# Patient Record
Sex: Female | Born: 1945
Health system: Southern US, Community
[De-identification: ages and names within clinical notes are randomized; demographics above are authoritative.]

## PROBLEM LIST (undated history)

## (undated) DIAGNOSIS — F419 Anxiety disorder, unspecified: Secondary | ICD-10-CM

## (undated) DIAGNOSIS — I10 Essential (primary) hypertension: Secondary | ICD-10-CM

## (undated) DIAGNOSIS — H269 Unspecified cataract: Secondary | ICD-10-CM

## (undated) DIAGNOSIS — C801 Malignant (primary) neoplasm, unspecified: Secondary | ICD-10-CM

## (undated) DIAGNOSIS — R7303 Prediabetes: Secondary | ICD-10-CM

## (undated) DIAGNOSIS — G4730 Sleep apnea, unspecified: Secondary | ICD-10-CM

## (undated) DIAGNOSIS — M1990 Unspecified osteoarthritis, unspecified site: Secondary | ICD-10-CM

## (undated) DIAGNOSIS — R112 Nausea with vomiting, unspecified: Secondary | ICD-10-CM

## (undated) DIAGNOSIS — D649 Anemia, unspecified: Secondary | ICD-10-CM

## (undated) DIAGNOSIS — T7840XA Allergy, unspecified, initial encounter: Secondary | ICD-10-CM

## (undated) DIAGNOSIS — Z9889 Other specified postprocedural states: Secondary | ICD-10-CM

## (undated) HISTORY — DX: Unspecified cataract: H26.9

## (undated) HISTORY — PX: COLON SURGERY: SHX602

## (undated) HISTORY — DX: Allergy, unspecified, initial encounter: T78.40XA

## (undated) HISTORY — PX: CATARACT EXTRACTION: SUR2

## (undated) HISTORY — PX: ABDOMINAL HYSTERECTOMY: SHX81

---

## 1974-07-25 HISTORY — PX: BREAST CYST EXCISION: SHX579

## 1994-07-25 HISTORY — PX: BREAST CYST EXCISION: SHX579

## 1994-07-25 HISTORY — PX: ABDOMINAL HYSTERECTOMY: SHX81

## 2004-06-15 DIAGNOSIS — J301 Allergic rhinitis due to pollen: Secondary | ICD-10-CM

## 2006-12-13 ENCOUNTER — Ambulatory Visit: Payer: Self-pay | Admitting: Gastroenterology

## 2007-11-22 DIAGNOSIS — H409 Unspecified glaucoma: Secondary | ICD-10-CM | POA: Insufficient documentation

## 2012-01-05 DIAGNOSIS — Z90710 Acquired absence of both cervix and uterus: Secondary | ICD-10-CM | POA: Insufficient documentation

## 2012-01-05 DIAGNOSIS — Z9889 Other specified postprocedural states: Secondary | ICD-10-CM | POA: Insufficient documentation

## 2012-01-05 DIAGNOSIS — Z803 Family history of malignant neoplasm of breast: Secondary | ICD-10-CM | POA: Insufficient documentation

## 2012-01-05 DIAGNOSIS — N6019 Diffuse cystic mastopathy of unspecified breast: Secondary | ICD-10-CM | POA: Insufficient documentation

## 2012-08-07 ENCOUNTER — Other Ambulatory Visit: Payer: Self-pay | Admitting: Family Medicine

## 2012-08-07 LAB — COMPREHENSIVE METABOLIC PANEL
Alkaline Phosphatase: 63 U/L (ref 50–136)
Anion Gap: 6 — ABNORMAL LOW (ref 7–16)
Chloride: 106 mmol/L (ref 98–107)
EGFR (Non-African Amer.): >60
Glucose: 99 mg/dL (ref 65–99)
Potassium: 4 mmol/L (ref 3.5–5.1)
Total Protein: 7.7 g/dL (ref 6.4–8.2)

## 2012-08-07 LAB — CK: CK, Total: 150 U/L (ref 21–215)

## 2012-08-07 LAB — LIPID PANEL
Cholesterol: 223 mg/dL — ABNORMAL HIGH (ref 0–200)
Triglycerides: 147 mg/dL (ref 0–200)
VLDL Cholesterol, Calc: 29 mg/dL (ref 5–40)

## 2013-11-14 LAB — LIPID PANEL
Cholesterol: 200 mg/dL (ref 0–200)
HDL: 74 mg/dL — AB (ref 35–70)
LDL CALC: 102 mg/dL
LDL/HDL RATIO: 1.4
TRIGLYCERIDES: 120 mg/dL (ref 40–160)

## 2013-11-14 LAB — CBC AND DIFFERENTIAL
HCT: 42 % (ref 36–46)
Hemoglobin: 13.9 g/dL (ref 12.0–16.0)
Neutrophils Absolute: 2 /uL
Platelets: 271 10*3/uL (ref 150–399)
WBC: 4.7 10*3/mL

## 2013-11-14 LAB — TSH: TSH: 3.27 u[IU]/mL (ref 0.41–5.90)

## 2014-01-22 LAB — HM MAMMOGRAPHY

## 2014-09-23 DIAGNOSIS — Z23 Encounter for immunization: Secondary | ICD-10-CM | POA: Diagnosis not present

## 2014-09-23 DIAGNOSIS — G4700 Insomnia, unspecified: Secondary | ICD-10-CM | POA: Diagnosis not present

## 2014-09-23 DIAGNOSIS — J069 Acute upper respiratory infection, unspecified: Secondary | ICD-10-CM | POA: Diagnosis not present

## 2014-09-23 DIAGNOSIS — I10 Essential (primary) hypertension: Secondary | ICD-10-CM | POA: Diagnosis not present

## 2014-09-23 DIAGNOSIS — E780 Pure hypercholesterolemia: Secondary | ICD-10-CM | POA: Diagnosis not present

## 2014-10-15 DIAGNOSIS — H409 Unspecified glaucoma: Secondary | ICD-10-CM | POA: Diagnosis not present

## 2014-10-21 DIAGNOSIS — H409 Unspecified glaucoma: Secondary | ICD-10-CM | POA: Diagnosis not present

## 2014-11-13 DIAGNOSIS — Z0000 Encounter for general adult medical examination without abnormal findings: Secondary | ICD-10-CM | POA: Diagnosis not present

## 2014-11-13 DIAGNOSIS — G4700 Insomnia, unspecified: Secondary | ICD-10-CM | POA: Diagnosis not present

## 2014-11-13 DIAGNOSIS — R748 Abnormal levels of other serum enzymes: Secondary | ICD-10-CM | POA: Diagnosis not present

## 2014-11-13 DIAGNOSIS — I10 Essential (primary) hypertension: Secondary | ICD-10-CM | POA: Diagnosis not present

## 2014-11-13 DIAGNOSIS — E780 Pure hypercholesterolemia: Secondary | ICD-10-CM | POA: Diagnosis not present

## 2014-11-13 DIAGNOSIS — R739 Hyperglycemia, unspecified: Secondary | ICD-10-CM | POA: Diagnosis not present

## 2014-11-13 DIAGNOSIS — Z23 Encounter for immunization: Secondary | ICD-10-CM | POA: Diagnosis not present

## 2014-11-13 LAB — BASIC METABOLIC PANEL
BUN: 18 mg/dL (ref 4–21)
Creatinine: 1 mg/dL (ref 0.5–1.1)
Glucose: 91 mg/dL
Potassium: 4.1 mmol/L (ref 3.4–5.3)
Sodium: 137 mmol/L (ref 137–147)

## 2014-11-13 LAB — HEPATIC FUNCTION PANEL
ALK PHOS: 50 U/L (ref 25–125)
ALT: 24 U/L (ref 7–35)
AST: 19 U/L (ref 13–35)
BILIRUBIN, TOTAL: 0.2 mg/dL

## 2014-11-13 LAB — HEMOGLOBIN A1C: Hemoglobin A1C: 5.9

## 2014-12-16 ENCOUNTER — Other Ambulatory Visit: Payer: Self-pay | Admitting: Family Medicine

## 2014-12-16 DIAGNOSIS — Z1231 Encounter for screening mammogram for malignant neoplasm of breast: Secondary | ICD-10-CM

## 2015-01-13 ENCOUNTER — Encounter: Payer: Self-pay | Admitting: *Deleted

## 2015-02-24 ENCOUNTER — Ambulatory Visit
Admission: RE | Admit: 2015-02-24 | Discharge: 2015-02-24 | Disposition: A | Discharge status: Home / Self Care | Payer: Commercial Managed Care - HMO | Source: Ambulatory Visit | Attending: Family Medicine | Admitting: Family Medicine

## 2015-02-24 DIAGNOSIS — Z1231 Encounter for screening mammogram for malignant neoplasm of breast: Secondary | ICD-10-CM | POA: Diagnosis not present

## 2015-04-16 DIAGNOSIS — Z85828 Personal history of other malignant neoplasm of skin: Secondary | ICD-10-CM | POA: Diagnosis not present

## 2015-04-16 DIAGNOSIS — D2261 Melanocytic nevi of right upper limb, including shoulder: Secondary | ICD-10-CM | POA: Diagnosis not present

## 2015-04-16 DIAGNOSIS — D1723 Benign lipomatous neoplasm of skin and subcutaneous tissue of right leg: Secondary | ICD-10-CM | POA: Diagnosis not present

## 2015-04-16 DIAGNOSIS — D225 Melanocytic nevi of trunk: Secondary | ICD-10-CM | POA: Diagnosis not present

## 2015-05-05 DIAGNOSIS — H40053 Ocular hypertension, bilateral: Secondary | ICD-10-CM | POA: Diagnosis not present

## 2015-09-02 ENCOUNTER — Other Ambulatory Visit: Payer: Self-pay

## 2015-09-02 DIAGNOSIS — G4700 Insomnia, unspecified: Secondary | ICD-10-CM

## 2015-09-02 MED ORDER — CYCLOBENZAPRINE HCL 5 MG PO TABS: 5.0000 mg | ORAL_TABLET | Freq: Every day | ORAL | Status: DC

## 2015-09-02 NOTE — Telephone Encounter (Signed)
Printed, please fax or call in to pharmacy. Thank you.   

## 2015-10-02 ENCOUNTER — Other Ambulatory Visit: Payer: Self-pay

## 2015-10-02 ENCOUNTER — Ambulatory Visit (INDEPENDENT_AMBULATORY_CARE_PROVIDER_SITE_OTHER): Payer: Commercial Managed Care - HMO | Admitting: Family Medicine

## 2015-10-02 ENCOUNTER — Encounter: Payer: Self-pay | Admitting: Family Medicine

## 2015-10-02 VITALS — BP 154/78 | HR 96 | Temp 97.7°F | Resp 16 | Wt 167.0 lb

## 2015-10-02 DIAGNOSIS — F419 Anxiety disorder, unspecified: Secondary | ICD-10-CM | POA: Insufficient documentation

## 2015-10-02 DIAGNOSIS — R739 Hyperglycemia, unspecified: Secondary | ICD-10-CM | POA: Insufficient documentation

## 2015-10-02 DIAGNOSIS — I10 Essential (primary) hypertension: Secondary | ICD-10-CM | POA: Insufficient documentation

## 2015-10-02 DIAGNOSIS — J069 Acute upper respiratory infection, unspecified: Secondary | ICD-10-CM | POA: Insufficient documentation

## 2015-10-02 DIAGNOSIS — G4730 Sleep apnea, unspecified: Secondary | ICD-10-CM | POA: Insufficient documentation

## 2015-10-02 DIAGNOSIS — H109 Unspecified conjunctivitis: Secondary | ICD-10-CM

## 2015-10-02 DIAGNOSIS — R748 Abnormal levels of other serum enzymes: Secondary | ICD-10-CM | POA: Insufficient documentation

## 2015-10-02 DIAGNOSIS — M1990 Unspecified osteoarthritis, unspecified site: Secondary | ICD-10-CM | POA: Insufficient documentation

## 2015-10-02 DIAGNOSIS — E7800 Pure hypercholesterolemia, unspecified: Secondary | ICD-10-CM | POA: Insufficient documentation

## 2015-10-02 MED ORDER — SULFACETAMIDE SODIUM 10 % OP SOLN: 2.0000 [drp] | OPHTHALMIC | Status: DC

## 2015-10-02 NOTE — Progress Notes (Signed)
Subjective:    Patient ID: OASIS FREDRICH, female    DOB: 1946-07-14, 70 y.o.   MRN: [id number]  Conjunctivitis  The current episode started 2 days ago. The onset was gradual. The problem occurs continuously. The problem has been gradually worsening. The problem is moderate. Relieved by: cold compresses. Did help clean gunk out of eye this am.  Associated symptoms include congestion, headaches (pressure. Mild. ), rhinorrhea, eye discharge and eye redness. Pertinent negatives include no fever, no decreased vision, no double vision, no eye itching, no photophobia, no abdominal pain, no ear discharge, no ear pain, no hearing loss, no mouth sores, no sore throat, no swollen glands, no muscle aches, no cough, no wheezing and no eye pain. The right eye is affected. The eyelid exhibits redness. She has been behaving normally.  Pt also has noticed URI sx including congestion x 2 days. No sick exposures. Did take alka seltzer cold.  Eye was more watery, now more dry.  No exposure to pink eye.    Does take eye drops for ocular hypertension.    Review of Systems  Constitutional: Negative for fever.  HENT: Positive for congestion and rhinorrhea. Negative for ear discharge, ear pain, hearing loss, mouth sores and sore throat.   Eyes: Positive for discharge and redness. Negative for double vision, photophobia, pain and itching.  Respiratory: Negative for cough and wheezing.   Gastrointestinal: Negative for abdominal pain.  Neurological: Positive for headaches (pressure. Mild. ).   BP 154/78 mmHg  Pulse 96  Temp(Src) 97.7 F (36.5 C) (Oral)  Resp 16  Wt 167 lb (75.751 kg)   Patient Active Problem List   Diagnosis Date Noted  . Anxiety 10/02/2015  . Abnormal liver enzymes 10/02/2015  . Hypercholesteremia 10/02/2015  . Blood glucose elevated 10/02/2015  . BP (high blood pressure) 10/02/2015  . Arthritis, degenerative 10/02/2015  . Apnea, sleep 10/02/2015  . Insomnia 09/02/2015  . Family  history of breast cancer in first degree relative 01/05/2012  . Bloodgood disease 01/05/2012  . Personal history of surgery to heart and great vessels, presenting hazards to health 01/05/2012  . H/O total hysterectomy 01/05/2012  . Glaucoma 11/22/2007  . Hay fever 06/15/2004   No past medical history on file. Current Outpatient Prescriptions on File Prior to Visit  Medication Sig  . cyclobenzaprine (FLEXERIL) 5 MG tablet Take 1 tablet (5 mg total) by mouth at bedtime.   No current facility-administered medications on file prior to visit.   Allergies  Allergen Reactions  . Silver Nitrate     LIQUID NITRATE used to remove warts   Past Surgical History  Procedure Laterality Date  . Breast cyst excision Right 1996    cyst removed-neg   Social History   Social History  . Marital Status: Married    Spouse Name: N/A  . Number of Children: N/A  . Years of Education: N/A   Occupational History  . Not on file.   Social History Main Topics  . Smoking status: Never Smoker   . Smokeless tobacco: Never Used  . Alcohol Use: Yes     Comment: occasional wine  . Drug Use: No  . Sexual Activity: Not on file   Other Topics Concern  . Not on file   Social History Narrative   Family History  Problem Relation Age of Onset  . Breast cancer Sister 51      Objective:   Physical Exam  Constitutional: She is oriented to person, place, and time.  She appears well-developed and well-nourished.  HENT:  Head: Normocephalic and atraumatic.  Right Ear: External ear normal.  Left Ear: External ear normal.  Mouth/Throat: Oropharynx is clear and moist.  Eyes: Pupils are equal, round, and reactive to light.  Right eye with inflamed conjunctiva.  FROM.  Sclera, pupil and iris normal.   Neck: Normal range of motion. Neck supple.  Cardiovascular: Normal rate and regular rhythm.   Pulmonary/Chest: Effort normal and breath sounds normal.  Neurological: She is alert and oriented to person,  place, and time.   BP 154/78 mmHg  Pulse 96  Temp(Src) 97.7 F (36.5 C) (Oral)  Resp 16  Wt 167 lb (75.751 kg)      Assessment & Plan:   1. Conjunctivitis of right eye New problem. Worsening. Treat with eye drops. Ok to use other eye drops. Referral to [facility] if any pain, worsening or vision changes.   - sulfacetamide (BLEPH-10) 10 % ophthalmic solution; Place 2 drops into the right eye every 3 (three) hours while awake.  Dispense: 15 mL; Refill: 0  2. Upper respiratory infection New problem. OTC treatment. Patient instructed to call back if condition worsens or does not improve, especially fever.   Margarita Rana, MD

## 2015-10-13 ENCOUNTER — Telehealth: Payer: Self-pay

## 2015-10-13 NOTE — Telephone Encounter (Signed)
Needs sooner ov if thinks she has sleep apnea.  Can take months for work up. Thanks.

## 2015-10-13 NOTE — Telephone Encounter (Signed)
FYI... Pt says she would like to discuss sleep apnea at her next office visit in May.   Thanks,   -Mickel Baas

## 2015-10-15 NOTE — Telephone Encounter (Signed)
LMTCB 10/15/2015  Thanks,   -Mickel Baas

## 2015-10-27 ENCOUNTER — Telehealth: Payer: Self-pay | Admitting: Family Medicine

## 2015-10-27 DIAGNOSIS — H409 Unspecified glaucoma: Secondary | ICD-10-CM

## 2015-10-27 NOTE — Telephone Encounter (Signed)
Pt called needing referrral to [facility] for a visit she has scheduled April 11th.  She has [employer]   Her call back is   [phone]  [employer]

## 2015-10-27 NOTE — Telephone Encounter (Signed)
OK to refer. Thanks.

## 2015-10-27 NOTE — Telephone Encounter (Signed)
Order has been placed to see opthalmology.

## 2015-10-27 NOTE — Telephone Encounter (Signed)
Please review. Thanks!  

## 2015-11-03 DIAGNOSIS — H40053 Ocular hypertension, bilateral: Secondary | ICD-10-CM | POA: Diagnosis not present

## 2015-11-05 DIAGNOSIS — C44529 Squamous cell carcinoma of skin of other part of trunk: Secondary | ICD-10-CM | POA: Diagnosis not present

## 2015-11-05 DIAGNOSIS — D485 Neoplasm of uncertain behavior of skin: Secondary | ICD-10-CM | POA: Diagnosis not present

## 2015-11-20 ENCOUNTER — Other Ambulatory Visit: Payer: Self-pay | Admitting: Family Medicine

## 2015-11-20 DIAGNOSIS — F419 Anxiety disorder, unspecified: Secondary | ICD-10-CM

## 2015-11-20 DIAGNOSIS — I10 Essential (primary) hypertension: Secondary | ICD-10-CM

## 2015-12-03 ENCOUNTER — Ambulatory Visit (INDEPENDENT_AMBULATORY_CARE_PROVIDER_SITE_OTHER): Payer: Commercial Managed Care - HMO | Admitting: Family Medicine

## 2015-12-03 ENCOUNTER — Encounter: Payer: Self-pay | Admitting: Family Medicine

## 2015-12-03 VITALS — BP 140/82 | HR 96 | Temp 97.9°F | Resp 16 | Ht 65.25 in | Wt 168.0 lb

## 2015-12-03 DIAGNOSIS — Z1239 Encounter for other screening for malignant neoplasm of breast: Secondary | ICD-10-CM

## 2015-12-03 DIAGNOSIS — I10 Essential (primary) hypertension: Secondary | ICD-10-CM | POA: Diagnosis not present

## 2015-12-03 DIAGNOSIS — R739 Hyperglycemia, unspecified: Secondary | ICD-10-CM

## 2015-12-03 DIAGNOSIS — Z1211 Encounter for screening for malignant neoplasm of colon: Secondary | ICD-10-CM

## 2015-12-03 DIAGNOSIS — F419 Anxiety disorder, unspecified: Secondary | ICD-10-CM

## 2015-12-03 DIAGNOSIS — Z0000 Encounter for general adult medical examination without abnormal findings: Secondary | ICD-10-CM

## 2015-12-03 DIAGNOSIS — E7800 Pure hypercholesterolemia, unspecified: Secondary | ICD-10-CM

## 2015-12-03 DIAGNOSIS — Z780 Asymptomatic menopausal state: Secondary | ICD-10-CM

## 2015-12-03 DIAGNOSIS — M1990 Unspecified osteoarthritis, unspecified site: Secondary | ICD-10-CM

## 2015-12-03 LAB — IFOBT (OCCULT BLOOD): IFOBT: NEGATIVE

## 2015-12-03 NOTE — Progress Notes (Signed)
Patient ID: Cindy Vega, female   DOB: 01/01/1946, 70 y.o.   MRN: [id number]       Patient: Cindy Vega, Female    DOB: [birth year], 70 y.o.   MRN: [id number] Visit Date: 12/03/2015  Today's Provider: Margarita Rana, MD   Chief Complaint  Patient presents with  . Medicare Wellness   Subjective:    Annual wellness visit Cindy Vega is a 70 y.o. female. She feels well. She reports exercising 2 times a week. She reports she is sleeping well.  11/13/14 AWE 03/02/15 Mammogram-BI-RADS 1 12/13/06 Colonoscopy-normal; Dr. Allen Norris -----------------------------------------------------------  Joint pain/swelling: Patient c/o joint pain and swelling in both hands times 2 years. Patient reports symptoms are worsening. Patient reports taking Aleve as needed and reports moderate relief. Patient wants to know if she needs to see rheumatologist.   Review of Systems  Constitutional: Negative.   HENT: Negative.   Eyes: Negative.   Respiratory: Negative.   Cardiovascular: Negative.   Gastrointestinal: Negative.   Endocrine: Negative.   Genitourinary: Negative.   Musculoskeletal: Positive for joint swelling.  Skin: Negative.   Allergic/Immunologic: Negative.   Neurological: Negative.   Hematological: Negative.   Psychiatric/Behavioral: Negative.     Social History   Social History  . Marital Status: Married    Spouse Name: N/A  . Number of Children: N/A  . Years of Education: N/A   Occupational History  . Not on file.   Social History Main Topics  . Smoking status: Never Smoker   . Smokeless tobacco: Never Used  . Alcohol Use: 1.2 oz/week    2 Glasses of wine per week     Comment: occasional wine  . Drug Use: No  . Sexual Activity: Not on file   Other Topics Concern  . Not on file   Social History Narrative    History reviewed. No pertinent past medical history.   Patient Active Problem List   Diagnosis Date Noted  . Anxiety 10/02/2015  . Abnormal  liver enzymes 10/02/2015  . Hypercholesteremia 10/02/2015  . Blood glucose elevated 10/02/2015  . BP (high blood pressure) 10/02/2015  . Arthritis, degenerative 10/02/2015  . Apnea, sleep 10/02/2015  . Upper respiratory infection 10/02/2015  . Insomnia 09/02/2015  . Family history of breast cancer in first degree relative 01/05/2012  . Bloodgood disease 01/05/2012  . Personal history of surgery to heart and great vessels, presenting hazards to health 01/05/2012  . H/O total hysterectomy 01/05/2012  . Glaucoma 11/22/2007  . Hay fever 06/15/2004    Past Surgical History  Procedure Laterality Date  . Breast cyst excision Right 1996    cyst removed-neg  . Cataract extraction    . Abdominal hysterectomy      Her family history includes Breast cancer (age of onset: 78) in her sister; Diabetes in her father and mother; Berenice Primas' disease in her sister; Heart disease in her mother; Hypertension in her mother; Prostate cancer in her father.    Previous Medications   ASCORBIC ACID (VITAMIN C) 500 MG TABLET    Take by mouth.   ASPIRIN 81 MG TABLET    Take by mouth every other day.    BIOTIN 5 MG CAPS       CHOLECALCIFEROL (VITAMIN D) 1000 UNITS TABLET    Take by mouth.   CYCLOBENZAPRINE (FLEXERIL) 5 MG TABLET    Take 1 tablet (5 mg total) by mouth at bedtime.   FLUOXETINE (PROZAC) 10 MG TABLET    TAKE 1 TABLET  EVERY DAY   LATANOPROST (XALATAN) 0.005 % OPHTHALMIC SOLUTION    1 drop at bedtime.   MULTIPLE VITAMIN PO    Take by mouth.   OMEGA-3 FATTY ACIDS (FISH OIL) 1000 MG CAPS       TRIAMTERENE-HYDROCHLOROTHIAZIDE (MAXZIDE-25) 37.5-25 MG TABLET    TAKE 1 TABLET EVERY DAY    Patient Care Team: Margarita Rana, MD as PCP - General (Family Medicine)     Objective:   Vitals: BP 140/82 mmHg  Pulse 96  Temp(Src) 97.9 F (36.6 C) (Oral)  Resp 16  Ht 5' 5.25" (1.657 m)  Wt 168 lb (76.204 kg)  BMI 27.75 kg/m2  Physical Exam  Constitutional: She is oriented to person, place, and time.  She appears well-developed and well-nourished.  HENT:  Head: Normocephalic and atraumatic.  Right Ear: Tympanic membrane, external ear and ear canal normal.  Left Ear: Tympanic membrane, external ear and ear canal normal.  Nose: Nose normal.  Mouth/Throat: Uvula is midline, oropharynx is clear and moist and mucous membranes are normal.  Eyes: Conjunctivae, EOM and lids are normal. Pupils are equal, round, and reactive to light.  Neck: Trachea normal and normal range of motion. Neck supple. Carotid bruit is not present. No thyroid mass and no thyromegaly present.  Cardiovascular: Normal rate, regular rhythm and normal heart sounds.   Pulmonary/Chest: Effort normal and breath sounds normal.  Abdominal: Soft. Normal appearance and bowel sounds are normal. There is no hepatosplenomegaly. There is no tenderness.  Genitourinary: Rectum normal. No breast swelling, tenderness or discharge.  Musculoskeletal: Normal range of motion.  Lymphadenopathy:    She has no cervical adenopathy.    She has no axillary adenopathy.  Neurological: She is alert and oriented to person, place, and time. She has normal strength. No cranial nerve deficit.  Skin: Skin is warm, dry and intact.  Psychiatric: She has a normal mood and affect. Her speech is normal and behavior is normal. Judgment and thought content normal. Cognition and memory are normal.    Activities of Daily Living In your present state of health, do you have any difficulty performing the following activities: 12/03/2015  Hearing? N  Vision? N  Difficulty concentrating or making decisions? N  Walking or climbing stairs? N  Dressing or bathing? N  Doing errands, shopping? N    Fall Risk Assessment Fall Risk  12/03/2015  Falls in the past year? No     Depression Screen PHQ 2/9 Scores 12/03/2015  PHQ - 2 Score 0    Cognitive Testing - 6-CIT  Correct? Score   What year is it? yes 0 0 or 4  What month is it? yes 0 0 or 3  Memorize:    Pia Mau,  [birth year],  [place],      What time is it? (within 1 hour) yes 0 0 or 3  Count backwards from 20 yes 0 0, 2, or 4  Name the months of the year yes 0 0, 2, or 4  Repeat name & address above yes 0 0, 2, 4, 6, 8, or 10       TOTAL SCORE  0/28   Interpretation:  Normal  Normal (0-7) Abnormal (8-28)    Assessment & Plan:     Annual Wellness Visit  Reviewed patient's Family Medical History Reviewed and updated list of patient's medical providers Assessment of cognitive impairment was done Assessed patient's functional ability Established a written schedule for health screening services Health Risk Assessment  Completed and Reviewed  Exercise Activities and Dietary recommendations Goals    None      Immunization History  Administered Date(s) Administered  . Influenza-Unspecified 04/24/2014, 05/26/2015  . Pneumococcal Conjugate-13 05/13/2014  . Pneumococcal Polysaccharide-23 05/08/2013  . Td 04/25/2003  . Tdap 08/18/2010  . Zoster 06/02/2008     1. Medicare annual wellness visit, subsequent As above. Patient advised to continue working on diet and exercise.  2. Arthritis F/U pending lab report. - Rheumatoid factor - Sedimentation rate - CYCLIC CITRUL PEPTIDE ANTIBODY, IGG/IGA  3. Colon cancer screening Negative - IFOBT POC (occult bld, rslt in office)  4. Breast cancer screening - MM DIGITAL SCREENING BILATERAL; Future  5. Essential hypertension - CBC with Differential/Platelet - Comprehensive metabolic panel  6. Anxiety - TSH  7. Postmenopausal - DG Bone Density; Future  8. Hypercholesteremia - Lipid Panel With LDL/HDL Ratio  9. Blood glucose elevated - Hemoglobin A1c     Patient seen and examined by Dr. Jerrell Belfast, and note scribed by Philbert Riser. Dimas, CMA.  I have reviewed the document for accuracy and completeness and I agree with above. Jerrell Belfast, MD   Margarita Rana, MD    ------------------------------------------------------------------------------------------------------------

## 2015-12-04 DIAGNOSIS — I10 Essential (primary) hypertension: Secondary | ICD-10-CM | POA: Diagnosis not present

## 2015-12-04 DIAGNOSIS — R739 Hyperglycemia, unspecified: Secondary | ICD-10-CM | POA: Diagnosis not present

## 2015-12-04 DIAGNOSIS — M1990 Unspecified osteoarthritis, unspecified site: Secondary | ICD-10-CM | POA: Diagnosis not present

## 2015-12-04 DIAGNOSIS — E7800 Pure hypercholesterolemia, unspecified: Secondary | ICD-10-CM | POA: Diagnosis not present

## 2015-12-04 DIAGNOSIS — F419 Anxiety disorder, unspecified: Secondary | ICD-10-CM | POA: Diagnosis not present

## 2015-12-06 LAB — HEMOGLOBIN A1C
Est. average glucose Bld gHb Est-mCnc: 126 mg/dL
HEMOGLOBIN A1C: 6 % — AB (ref 4.8–5.6)

## 2015-12-06 LAB — COMPREHENSIVE METABOLIC PANEL
A/G RATIO: 2 (ref 1.2–2.2)
ALK PHOS: 53 IU/L (ref 39–117)
ALT: 27 IU/L (ref 0–32)
AST: 20 IU/L (ref 0–40)
Albumin: 4.6 g/dL (ref 3.6–4.8)
BILIRUBIN TOTAL: 0.4 mg/dL (ref 0.0–1.2)
BUN / CREAT RATIO: 18 (ref 12–28)
BUN: 19 mg/dL (ref 8–27)
CHLORIDE: 98 mmol/L (ref 96–106)
CO2: 26 mmol/L (ref 18–29)
Calcium: 9.8 mg/dL (ref 8.7–10.3)
Creatinine, Ser: 1.06 mg/dL — ABNORMAL HIGH (ref 0.57–1.00)
GFR calc Af Amer: 62 mL/min/{1.73_m2} (ref >59)
GFR calc non Af Amer: 54 mL/min/{1.73_m2} — ABNORMAL LOW (ref >59)
GLUCOSE: 103 mg/dL — AB (ref 65–99)
Globulin, Total: 2.3 g/dL (ref 1.5–4.5)
POTASSIUM: 4.5 mmol/L (ref 3.5–5.2)
Sodium: 139 mmol/L (ref 134–144)
Total Protein: 6.9 g/dL (ref 6.0–8.5)

## 2015-12-06 LAB — CBC WITH DIFFERENTIAL/PLATELET
BASOS: 1 %
Basophils Absolute: 0 10*3/uL (ref 0.0–0.2)
EOS (ABSOLUTE): 0.2 10*3/uL (ref 0.0–0.4)
EOS: 4 %
HEMATOCRIT: 40.2 % (ref 34.0–46.6)
HEMOGLOBIN: 13.4 g/dL (ref 11.1–15.9)
IMMATURE GRANULOCYTES: 0 %
Immature Grans (Abs): 0 10*3/uL (ref 0.0–0.1)
Lymphocytes Absolute: 2.1 10*3/uL (ref 0.7–3.1)
Lymphs: 41 %
MCH: 31.2 pg (ref 26.6–33.0)
MCHC: 33.3 g/dL (ref 31.5–35.7)
MCV: 94 fL (ref 79–97)
MONOCYTES: 9 %
Monocytes Absolute: 0.5 10*3/uL (ref 0.1–0.9)
NEUTROS PCT: 45 %
Neutrophils Absolute: 2.3 10*3/uL (ref 1.4–7.0)
Platelets: 310 10*3/uL (ref 150–379)
RBC: 4.29 x10E6/uL (ref 3.77–5.28)
RDW: 14 % (ref 12.3–15.4)
WBC: 5.1 10*3/uL (ref 3.4–10.8)

## 2015-12-06 LAB — LIPID PANEL WITH LDL/HDL RATIO
CHOLESTEROL TOTAL: 260 mg/dL — AB (ref 100–199)
HDL: 86 mg/dL (ref >39)
LDL Calculated: 142 mg/dL — ABNORMAL HIGH (ref 0–99)
LDL/HDL RATIO: 1.7 ratio (ref 0.0–3.2)
TRIGLYCERIDES: 159 mg/dL — AB (ref 0–149)
VLDL Cholesterol Cal: 32 mg/dL (ref 5–40)

## 2015-12-06 LAB — RHEUMATOID FACTOR

## 2015-12-06 LAB — SEDIMENTATION RATE: SED RATE: 2 mm/h (ref 0–40)

## 2015-12-06 LAB — CYCLIC CITRUL PEPTIDE ANTIBODY, IGG/IGA: CYCLIC CITRULLIN PEPTIDE AB: 6 U (ref 0–19)

## 2015-12-06 LAB — TSH: TSH: 2.48 u[IU]/mL (ref 0.450–4.500)

## 2015-12-07 ENCOUNTER — Telehealth: Payer: Self-pay

## 2015-12-07 NOTE — Telephone Encounter (Signed)
-----   Message from Margarita Rana, MD sent at 12/06/2015 12:05 PM EDT ----- Labs stable. Cholesterol is elevated at 260.  10 year risk of heart disease is 11 percent.  Medication is recommended. Please see if patient would like to consider starting a medication.  Blood sugar is slightly elevated.  Continue to eat healthy and exercise. Recheck in 6 months. Thanks.

## 2015-12-07 NOTE — Telephone Encounter (Signed)
Left message to call back  

## 2015-12-07 NOTE — Telephone Encounter (Signed)
Advised patient of results. Patient reports that she will work on diet habits first. Will recheck labs in 6 months.

## 2015-12-17 DIAGNOSIS — C44529 Squamous cell carcinoma of skin of other part of trunk: Secondary | ICD-10-CM | POA: Diagnosis not present

## 2016-02-25 ENCOUNTER — Other Ambulatory Visit: Payer: Self-pay | Admitting: Family Medicine

## 2016-02-25 ENCOUNTER — Ambulatory Visit
Admission: RE | Admit: 2016-02-25 | Discharge: 2016-02-25 | Disposition: A | Discharge status: Home / Self Care | Payer: Commercial Managed Care - HMO | Source: Ambulatory Visit | Attending: Family Medicine | Admitting: Family Medicine

## 2016-02-25 DIAGNOSIS — Z1239 Encounter for other screening for malignant neoplasm of breast: Secondary | ICD-10-CM | POA: Insufficient documentation

## 2016-02-25 DIAGNOSIS — Z780 Asymptomatic menopausal state: Secondary | ICD-10-CM | POA: Diagnosis not present

## 2016-02-25 DIAGNOSIS — M8588 Other specified disorders of bone density and structure, other site: Secondary | ICD-10-CM | POA: Diagnosis not present

## 2016-02-25 DIAGNOSIS — M85852 Other specified disorders of bone density and structure, left thigh: Secondary | ICD-10-CM | POA: Insufficient documentation

## 2016-02-25 DIAGNOSIS — Z1231 Encounter for screening mammogram for malignant neoplasm of breast: Secondary | ICD-10-CM | POA: Insufficient documentation

## 2016-02-25 HISTORY — DX: Malignant (primary) neoplasm, unspecified: C80.1

## 2016-04-01 ENCOUNTER — Ambulatory Visit (INDEPENDENT_AMBULATORY_CARE_PROVIDER_SITE_OTHER): Payer: Commercial Managed Care - HMO | Admitting: Family Medicine

## 2016-04-01 DIAGNOSIS — Z23 Encounter for immunization: Secondary | ICD-10-CM | POA: Diagnosis not present

## 2016-04-01 NOTE — Progress Notes (Signed)
Vaccine only, no MD visit.  

## 2016-04-14 DIAGNOSIS — D1801 Hemangioma of skin and subcutaneous tissue: Secondary | ICD-10-CM | POA: Diagnosis not present

## 2016-04-14 DIAGNOSIS — Z85828 Personal history of other malignant neoplasm of skin: Secondary | ICD-10-CM | POA: Diagnosis not present

## 2016-04-14 DIAGNOSIS — L448 Other specified papulosquamous disorders: Secondary | ICD-10-CM | POA: Diagnosis not present

## 2016-04-14 DIAGNOSIS — L821 Other seborrheic keratosis: Secondary | ICD-10-CM | POA: Diagnosis not present

## 2016-05-03 DIAGNOSIS — H40053 Ocular hypertension, bilateral: Secondary | ICD-10-CM | POA: Diagnosis not present

## 2016-11-08 DIAGNOSIS — H40053 Ocular hypertension, bilateral: Secondary | ICD-10-CM | POA: Diagnosis not present

## 2016-11-29 ENCOUNTER — Telehealth: Payer: Self-pay | Admitting: Physician Assistant

## 2016-11-29 DIAGNOSIS — I10 Essential (primary) hypertension: Secondary | ICD-10-CM

## 2016-11-29 DIAGNOSIS — F339 Major depressive disorder, recurrent, unspecified: Secondary | ICD-10-CM

## 2016-11-29 MED ORDER — TRIAMTERENE-HCTZ 37.5-25 MG PO TABS: 1.0000 | ORAL_TABLET | Freq: Every day | ORAL | 3 refills | Status: DC

## 2016-11-29 MED ORDER — FLUOXETINE HCL 10 MG PO TABS: 10.0000 mg | ORAL_TABLET | Freq: Every day | ORAL | 3 refills | Status: DC

## 2016-11-29 NOTE — Telephone Encounter (Addendum)
[facility] pharmacy faxed a request for the following medications. Thanks CC  FLUoxetine (PROZAC) 10 MG tablet  Take 1 tablet every day.  triamterene-hydrochlorothiazide (MAXZIDE-25) 37.5-25 MG tablet  Take 1 tablet every day.

## 2016-11-29 NOTE — Telephone Encounter (Signed)
Please review. KW 

## 2016-12-05 ENCOUNTER — Ambulatory Visit (INDEPENDENT_AMBULATORY_CARE_PROVIDER_SITE_OTHER): Payer: Medicare HMO | Admitting: Physician Assistant

## 2016-12-05 ENCOUNTER — Encounter: Payer: Self-pay | Admitting: Physician Assistant

## 2016-12-05 VITALS — BP 120/78 | HR 88 | Temp 98.1°F | Resp 16 | Ht 65.25 in | Wt 165.2 lb

## 2016-12-05 DIAGNOSIS — Z1211 Encounter for screening for malignant neoplasm of colon: Secondary | ICD-10-CM

## 2016-12-05 DIAGNOSIS — Z0000 Encounter for general adult medical examination without abnormal findings: Secondary | ICD-10-CM

## 2016-12-05 DIAGNOSIS — Z1231 Encounter for screening mammogram for malignant neoplasm of breast: Secondary | ICD-10-CM | POA: Diagnosis not present

## 2016-12-05 DIAGNOSIS — R739 Hyperglycemia, unspecified: Secondary | ICD-10-CM | POA: Diagnosis not present

## 2016-12-05 DIAGNOSIS — Z803 Family history of malignant neoplasm of breast: Secondary | ICD-10-CM | POA: Diagnosis not present

## 2016-12-05 DIAGNOSIS — Z1239 Encounter for other screening for malignant neoplasm of breast: Secondary | ICD-10-CM

## 2016-12-05 DIAGNOSIS — E7800 Pure hypercholesterolemia, unspecified: Secondary | ICD-10-CM

## 2016-12-05 DIAGNOSIS — I10 Essential (primary) hypertension: Secondary | ICD-10-CM | POA: Diagnosis not present

## 2016-12-05 NOTE — Progress Notes (Signed)
Patient: Cindy Vega, Female    DOB: 1945-09-20, 71 y.o.   MRN: [id number] Visit Date: 12/05/2016  Today's Provider: Mar Daring, PA-C   Chief Complaint  Patient presents with  . Medicare Wellness   Subjective:    Annual wellness visit Cindy Vega is a 71 y.o. female. She feels well. She reports exercising active with daily activites. She reports she is sleeping well.  12/03/15 AWE 02/25/16 Mammogram-BI-RADS 1 02/25/16 BMD-Osteopenia 12/13/06 Colonoscopy-normal, dr. Allen Norris -----------------------------------------------------------  Review of Systems  Constitutional: Negative.   HENT: Negative.   Eyes: Negative.   Respiratory: Negative.   Cardiovascular: Negative.   Gastrointestinal: Negative.   Endocrine: Negative.   Genitourinary: Negative.   Musculoskeletal: Negative.   Skin: Negative.   Allergic/Immunologic: Negative.   Neurological: Negative.   Hematological: Negative.   Psychiatric/Behavioral: Negative.     Social History   Social History  . Marital status: Married    Spouse name: N/A  . Number of children: 2  . Years of education: N/A   Occupational History  . Not on file.   Social History Main Topics  . Smoking status: Never Smoker  . Smokeless tobacco: Never Used  . Alcohol use 2.4 oz/week    4 Glasses of wine per week     Comment: occasional wine  . Drug use: No  . Sexual activity: Not on file   Other Topics Concern  . Not on file   Social History Narrative  . No narrative on file    Past Medical History:  Diagnosis Date  . Cancer ([place])    basal cell     Patient Active Problem List   Diagnosis Date Noted  . Anxiety 10/02/2015  . Abnormal liver enzymes 10/02/2015  . Hypercholesteremia 10/02/2015  . Blood glucose elevated 10/02/2015  . BP (high blood pressure) 10/02/2015  . Arthritis, degenerative 10/02/2015  . Apnea, sleep 10/02/2015  . Upper respiratory infection 10/02/2015  . Insomnia 09/02/2015    . Family history of breast cancer in first degree relative 01/05/2012  . Bloodgood disease 01/05/2012  . Personal history of surgery to heart and great vessels, presenting hazards to health 01/05/2012  . H/O total hysterectomy 01/05/2012  . Glaucoma 11/22/2007  . Hay fever 06/15/2004    Past Surgical History:  Procedure Laterality Date  . ABDOMINAL HYSTERECTOMY    . BREAST CYST EXCISION Right 1996   cyst removed-neg  . CATARACT EXTRACTION      Her family history includes Breast cancer (age of onset: 74) in her sister; Diabetes in her father and mother; Berenice Primas' disease in her sister; Heart disease in her mother; Hypertension in her mother; Prostate cancer in her father.      Current Outpatient Prescriptions:  .  ascorbic acid (VITAMIN C) 500 MG tablet, Take by mouth., Disp: , Rfl:  .  aspirin 81 MG tablet, Take by mouth every other day. , Disp: , Rfl:  .  Biotin 5 MG CAPS, , Disp: , Rfl:  .  cholecalciferol (VITAMIN D) 1000 units tablet, Take by mouth., Disp: , Rfl:  .  FLUoxetine (PROZAC) 10 MG tablet, Take 1 tablet (10 mg total) by mouth daily., Disp: 90 tablet, Rfl: 3 .  latanoprost (XALATAN) 0.005 % ophthalmic solution, 1 drop at bedtime., Disp: , Rfl:  .  MULTIPLE VITAMIN PO, Take by mouth., Disp: , Rfl:  .  Omega-3 Fatty Acids (FISH OIL) 1000 MG CAPS, , Disp: , Rfl:  .  psyllium (  METAMUCIL) 58.6 % packet, Take 1 packet by mouth daily., Disp: , Rfl:  .  triamterene-hydrochlorothiazide (MAXZIDE-25) 37.5-25 MG tablet, Take 1 tablet by mouth daily., Disp: 90 tablet, Rfl: 3  Patient Care Team: Mar Daring, PA-C as PCP - General (Family Medicine)     Objective:   Vitals: BP 120/78 (BP Location: Left Arm, Patient Position: Sitting, Cuff Size: Large)   Pulse 88   Temp 98.1 F (36.7 C) (Oral)   Resp 16   Ht 5' 5.25" (1.657 m)   Wt 165 lb 3.2 oz (74.9 kg)   BMI 27.28 kg/m   Physical Exam  Activities of Daily Living In your present state of health, do you have  any difficulty performing the following activities: 12/05/2016  Hearing? N  Vision? N  Difficulty concentrating or making decisions? N  Walking or climbing stairs? N  Dressing or bathing? N  Doing errands, shopping? N  Some recent data might be hidden    Fall Risk Assessment Fall Risk  12/05/2016 12/03/2015  Falls in the past year? No No     Depression Screen PHQ 2/9 Scores 12/05/2016 12/03/2015  PHQ - 2 Score 0 0  PHQ- 9 Score 1 -    Cognitive Testing - 6-CIT  Correct? Score   What year is it? yes 0 0 or 4  What month is it? yes 0 0 or 3  Memorize:    Pia Mau,  43,  [place],      What time is it? (within 1 hour) yes 0 0 or 3  Count backwards from 20 yes 0 0, 2, or 4  Name the months of the year yes 0 0, 2, or 4  Repeat name & address above yes 0 0, 2, 4, 6, 8, or 10       TOTAL SCORE  0/28   Interpretation:  Normal  Normal (0-7) Abnormal (8-28)    Current Exercise Habits: The patient does not participate in regular exercise at present    Audit-C Alcohol Use Screening  Question Answer Points  How often do you have alcoholic drink? 4 times weekly 1  On days you do drink alcohol, how many drinks do you typically consume? 1 0  How oftey will you drink 6 or more in a total? never 0  Total Score:  1   A score of 3 or more in women, and 4 or more in men indicates increased risk for alcohol abuse, EXCEPT if all of the points are from question 1.     Assessment & Plan:     Annual Wellness Visit  Reviewed patient's Family Medical History Reviewed and updated list of patient's medical providers Assessment of cognitive impairment was done Assessed patient's functional ability Established a written schedule for health screening [place] Completed and Reviewed  Exercise Activities and Dietary recommendations Goals    None      Immunization History  Administered Date(s) Administered  . Influenza, High Dose Seasonal PF  04/01/2016  . Influenza-Unspecified 04/24/2014, 05/26/2015  . Pneumococcal Conjugate-13 05/13/2014  . Pneumococcal Polysaccharide-23 05/08/2013  . Td 04/25/2003  . Tdap 08/18/2010  . Zoster 06/02/2008     Discussed health benefits of physical activity, and encouraged her to engage in regular exercise appropriate for her age and condition.    1. Medicare annual wellness visit, subsequent Normal exam for age. Up to date on vaccinations. Discussed getting Shingrix in the future.   2. Essential hypertension Stable  on Maxzide 37.5-25mg  daily. Will check labs as below and f/u pending results. - CBC with Differential/Platelet - Comprehensive metabolic panel - TSH  3. Hypercholesteremia Previously borderline high. Diet controlled. Will check labs as below and f/u pending results. - Lipid Panel With LDL/HDL Ratio - TSH  4. Blood glucose elevated Diet controlled.   5. Screening for colon cancer Due for screening colonoscopy. Referral placed back to Dr. Allen Norris as below.  - Ambulatory referral to Gastroenterology  6. Screening for breast cancer Sister was diagnosed in 2006 and is still doing treatments. Has had it metastasize. Sister is still living. Patient does have dense breasts and has been getting 3D mammograms. Mammogram order placed as below.  - MM DIAG BREAST TOMO BILATERAL; Future  7. Family history of breast cancer in first degree relative See above medical treatment plan. - MM DIAG BREAST TOMO BILATERAL; Future  ------------------------------------------------------------------------------------------------------------    Mar Daring, PA-C  [facility]

## 2016-12-05 NOTE — Patient Instructions (Signed)
Health Maintenance for Postmenopausal Women Menopause is a normal process in which your reproductive ability comes to an end. This process happens gradually over a span of months to years, usually between the ages of 33 and 38. Menopause is complete when you have missed 12 consecutive menstrual periods. It is important to talk with your health care provider about some of the most common conditions that affect postmenopausal women, such as heart disease, cancer, and bone loss (osteoporosis). Adopting a healthy lifestyle and getting preventive care can help to promote your health and wellness. Those actions can also lower your chances of developing some of these common conditions. What should I know about menopause? During menopause, you may experience a number of symptoms, such as:  Moderate-to-severe hot flashes.  Night sweats.  Decrease in sex drive.  Mood swings.  Headaches.  Tiredness.  Irritability.  Memory problems.  Insomnia. Choosing to treat or not to treat menopausal changes is an individual decision that you make with your health care provider. What should I know about hormone replacement therapy and supplements? Hormone therapy products are effective for treating symptoms that are associated with menopause, such as hot flashes and night sweats. Hormone replacement carries certain risks, especially as you become older. If you are thinking about using estrogen or estrogen with progestin treatments, discuss the benefits and risks with your health care provider. What should I know about heart disease and stroke? Heart disease, heart attack, and stroke become more likely as you age. This may be due, in part, to the hormonal changes that your body experiences during menopause. These can affect how your body processes dietary fats, triglycerides, and cholesterol. Heart attack and stroke are both medical emergencies. There are many things that you can do to help prevent heart disease  and stroke:  Have your blood pressure checked at least every 1-2 years. High blood pressure causes heart disease and increases the risk of stroke.  If you are 48-61 years old, ask your health care provider if you should take aspirin to prevent a heart attack or a stroke.  Do not use any tobacco products, including cigarettes, chewing tobacco, or electronic cigarettes. If you need help quitting, ask your health care provider.  It is important to eat a healthy diet and maintain a healthy weight.  Be sure to include plenty of vegetables, fruits, low-fat dairy products, and lean protein.  Avoid eating foods that are high in solid fats, added sugars, or salt (sodium).  Get regular exercise. This is one of the most important things that you can do for your health.  Try to exercise for at least 150 minutes each week. The type of exercise that you do should increase your heart rate and make you sweat. This is known as moderate-intensity exercise.  Try to do strengthening exercises at least twice each week. Do these in addition to the moderate-intensity exercise.  Know your numbers.Ask your health care provider to check your cholesterol and your blood glucose. Continue to have your blood tested as directed by your health care provider. What should I know about cancer screening? There are several types of cancer. Take the following steps to reduce your risk and to catch any cancer development as early as possible. Breast Cancer  Practice breast self-awareness.  This means understanding how your breasts normally appear and feel.  It also means doing regular breast self-exams. Let your health care provider know about any changes, no matter how small.  If you are 40 or older,  have a clinician do a breast exam (clinical breast exam or CBE) every year. Depending on your age, family history, and medical history, it may be recommended that you also have a yearly breast X-ray (mammogram).  If you  have a family history of breast cancer, talk with your health care provider about genetic screening.  If you are at high risk for breast cancer, talk with your health care provider about having an MRI and a mammogram every year.  Breast cancer (BRCA) gene test is recommended for women who have family members with BRCA-related cancers. Results of the assessment will determine the need for genetic counseling and BRCA1 and for BRCA2 testing. BRCA-related cancers include these types:  Breast. This occurs in males or females.  Ovarian.  Tubal. This may also be called fallopian tube cancer.  Cancer of the abdominal or pelvic lining (peritoneal cancer).  Prostate.  Pancreatic. Cervical, Uterine, and Ovarian Cancer  Your health care provider may recommend that you be screened regularly for cancer of the pelvic organs. These include your ovaries, uterus, and vagina. This screening involves a pelvic exam, which includes checking for microscopic changes to the surface of your cervix (Pap test).  For women ages 21-65, health care providers may recommend a pelvic exam and a Pap test every three years. For women ages 23-65, they may recommend the Pap test and pelvic exam, combined with testing for human papilloma virus (HPV), every five years. Some types of HPV increase your risk of cervical cancer. Testing for HPV may also be done on women of any age who have unclear Pap test results.  Other health care providers may not recommend any screening for nonpregnant women who are considered low risk for pelvic cancer and have no symptoms. Ask your health care provider if a screening pelvic exam is right for you.  If you have had past treatment for cervical cancer or a condition that could lead to cancer, you need Pap tests and screening for cancer for at least 20 years after your treatment. If Pap tests have been discontinued for you, your risk factors (such as having a new sexual partner) need to be reassessed  to determine if you should start having screenings again. Some women have medical problems that increase the chance of getting cervical cancer. In these cases, your health care provider may recommend that you have screening and Pap tests more often.  If you have a family history of uterine cancer or ovarian cancer, talk with your health care provider about genetic screening.  If you have vaginal bleeding after reaching menopause, tell your health care provider.  There are currently no reliable tests available to screen for ovarian cancer. Lung Cancer  Lung cancer screening is recommended for adults 99-83 years old who are at high risk for lung cancer because of a history of smoking. A yearly low-dose CT scan of the lungs is recommended if you:  Currently smoke.  Have a history of at least 30 pack-years of smoking and you currently smoke or have quit within the past 15 years. A pack-year is smoking an average of one pack of cigarettes per day for one year. Yearly screening should:  Continue until it has been 15 years since you quit.  Stop if you develop a health problem that would prevent you from having lung cancer treatment. Colorectal Cancer  This type of cancer can be detected and can often be prevented.  Routine colorectal cancer screening usually begins at age 72 and continues  through age 75.  If you have risk factors for colon cancer, your health care provider may recommend that you be screened at an earlier age.  If you have a family history of colorectal cancer, talk with your health care provider about genetic screening.  Your health care provider may also recommend using home test kits to check for hidden blood in your stool.  A small camera at the end of a tube can be used to examine your colon directly (sigmoidoscopy or colonoscopy). This is done to check for the earliest forms of colorectal cancer.  Direct examination of the colon should be repeated every 5-10 years until  age 75. However, if early forms of precancerous polyps or small growths are found or if you have a family history or genetic risk for colorectal cancer, you may need to be screened more often. Skin Cancer  Check your skin from head to toe regularly.  Monitor any moles. Be sure to tell your health care provider:  About any new moles or changes in moles, especially if there is a change in a mole's shape or color.  If you have a mole that is larger than the size of a pencil eraser.  If any of your family members has a history of skin cancer, especially at a young age, talk with your health care provider about genetic screening.  Always use sunscreen. Apply sunscreen liberally and repeatedly throughout the day.  Whenever you are outside, protect yourself by wearing long sleeves, pants, a wide-brimmed hat, and sunglasses. What should I know about osteoporosis? Osteoporosis is a condition in which bone destruction happens more quickly than new bone creation. After menopause, you may be at an increased risk for osteoporosis. To help prevent osteoporosis or the bone fractures that can happen because of osteoporosis, the following is recommended:  If you are 19-50 years old, get at least 1,000 mg of calcium and at least 600 mg of vitamin D per day.  If you are older than age 50 but younger than age 70, get at least 1,200 mg of calcium and at least 600 mg of vitamin D per day.  If you are older than age 70, get at least 1,200 mg of calcium and at least 800 mg of vitamin D per day. Smoking and excessive alcohol intake increase the risk of osteoporosis. Eat foods that are rich in calcium and vitamin D, and do weight-bearing exercises several times each week as directed by your health care provider. What should I know about how menopause affects my mental health? Depression may occur at any age, but it is more common as you become older. Common symptoms of depression include:  Low or sad  mood.  Changes in sleep patterns.  Changes in appetite or eating patterns.  Feeling an overall lack of motivation or enjoyment of activities that you previously enjoyed.  Frequent crying spells. Talk with your health care provider if you think that you are experiencing depression. What should I know about immunizations? It is important that you get and maintain your immunizations. These include:  Tetanus, diphtheria, and pertussis (Tdap) booster vaccine.  Influenza every year before the flu season begins.  Pneumonia vaccine.  Shingles vaccine. Your health care provider may also recommend other immunizations. This information is not intended to replace advice given to you by your health care provider. Make sure you discuss any questions you have with your health care provider. Document Released: 09/02/2005 Document Revised: 01/29/2016 Document Reviewed: 04/14/2015 Elsevier Interactive Patient   Education  2017 Elsevier Inc.  

## 2016-12-06 ENCOUNTER — Telehealth: Payer: Self-pay | Admitting: Physician Assistant

## 2016-12-06 NOTE — Telephone Encounter (Signed)
Per [facility] unless pt is having new breast problems mammogram should be screening mammogram TOMO,not diagnostic

## 2016-12-06 NOTE — Telephone Encounter (Signed)
Changed.

## 2016-12-06 NOTE — Addendum Note (Signed)
Addended by: Mar Daring on: 12/06/2016 04:58 PM   Modules accepted: Orders

## 2016-12-07 DIAGNOSIS — R739 Hyperglycemia, unspecified: Secondary | ICD-10-CM | POA: Diagnosis not present

## 2016-12-07 DIAGNOSIS — E7800 Pure hypercholesterolemia, unspecified: Secondary | ICD-10-CM | POA: Diagnosis not present

## 2016-12-07 DIAGNOSIS — I10 Essential (primary) hypertension: Secondary | ICD-10-CM | POA: Diagnosis not present

## 2016-12-08 ENCOUNTER — Telehealth: Payer: Self-pay

## 2016-12-08 LAB — COMPREHENSIVE METABOLIC PANEL
ALT: 21 IU/L (ref 0–32)
AST: 17 IU/L (ref 0–40)
Albumin/Globulin Ratio: 2 (ref 1.2–2.2)
Albumin: 4.9 g/dL — ABNORMAL HIGH (ref 3.6–4.8)
Alkaline Phosphatase: 49 IU/L (ref 39–117)
BUN / CREAT RATIO: 14 (ref 12–28)
BUN: 14 mg/dL (ref 8–27)
Bilirubin Total: 0.4 mg/dL (ref 0.0–1.2)
CALCIUM: 9.9 mg/dL (ref 8.7–10.3)
CO2: 22 mmol/L (ref 18–29)
CREATININE: 0.97 mg/dL (ref 0.57–1.00)
Chloride: 93 mmol/L — ABNORMAL LOW (ref 96–106)
GFR, EST AFRICAN AMERICAN: 69 mL/min/{1.73_m2} (ref >59)
GFR, EST NON AFRICAN AMERICAN: 60 mL/min/{1.73_m2} (ref >59)
GLUCOSE: 98 mg/dL (ref 65–99)
Globulin, Total: 2.5 g/dL (ref 1.5–4.5)
Potassium: 4.1 mmol/L (ref 3.5–5.2)
Sodium: 137 mmol/L (ref 134–144)
TOTAL PROTEIN: 7.4 g/dL (ref 6.0–8.5)

## 2016-12-08 LAB — LIPID PANEL WITH LDL/HDL RATIO
CHOLESTEROL TOTAL: 247 mg/dL — AB (ref 100–199)
HDL: 88 mg/dL (ref >39)
LDL CALC: 127 mg/dL — AB (ref 0–99)
LDl/HDL Ratio: 1.4 ratio (ref 0.0–3.2)
Triglycerides: 160 mg/dL — ABNORMAL HIGH (ref 0–149)
VLDL CHOLESTEROL CAL: 32 mg/dL (ref 5–40)

## 2016-12-08 LAB — CBC WITH DIFFERENTIAL/PLATELET
BASOS ABS: 0 10*3/uL (ref 0.0–0.2)
Basos: 0 %
EOS (ABSOLUTE): 0.3 10*3/uL (ref 0.0–0.4)
EOS: 4 %
HEMOGLOBIN: 14.1 g/dL (ref 11.1–15.9)
Hematocrit: 42 % (ref 34.0–46.6)
IMMATURE GRANS (ABS): 0 10*3/uL (ref 0.0–0.1)
Immature Granulocytes: 0 %
LYMPHS: 40 %
Lymphocytes Absolute: 2.9 10*3/uL (ref 0.7–3.1)
MCH: 32.1 pg (ref 26.6–33.0)
MCHC: 33.6 g/dL (ref 31.5–35.7)
MCV: 96 fL (ref 79–97)
MONOCYTES: 11 %
Monocytes Absolute: 0.8 10*3/uL (ref 0.1–0.9)
NEUTROS PCT: 45 %
Neutrophils Absolute: 3.3 10*3/uL (ref 1.4–7.0)
Platelets: 340 10*3/uL (ref 150–379)
RBC: 4.39 x10E6/uL (ref 3.77–5.28)
RDW: 13.8 % (ref 12.3–15.4)
WBC: 7.3 10*3/uL (ref 3.4–10.8)

## 2016-12-08 LAB — TSH: TSH: 2.66 u[IU]/mL (ref 0.450–4.500)

## 2016-12-08 NOTE — Telephone Encounter (Signed)
Patient advised as below.  

## 2016-12-08 NOTE — Telephone Encounter (Signed)
-----   Message from Mar Daring, PA-C sent at 12/08/2016  8:50 AM EDT ----- Cholesterol is still borderline elevated but is improved since last year. All other labs are WNL and stable.

## 2016-12-12 ENCOUNTER — Other Ambulatory Visit: Payer: Self-pay

## 2016-12-12 ENCOUNTER — Telehealth: Payer: Self-pay

## 2016-12-12 DIAGNOSIS — Z1211 Encounter for screening for malignant neoplasm of colon: Secondary | ICD-10-CM

## 2016-12-12 NOTE — Telephone Encounter (Signed)
Gastroenterology Pre-Procedure Review  Request Date: 03/02/17 Requesting Physician: Dr. Allen Norris  PATIENT REVIEW QUESTIONS: The patient responded to the following health history questions as indicated:    1. Are you having any GI issues? no 2. Do you have a personal history of Polyps? no 3. Do you have a family history of Colon Cancer or Polyps? no 4. Diabetes Mellitus? no 5. Joint replacements in the past 12 months?no 6. Major health problems in the past 3 months?no 7. Any artificial heart valves, MVP, or defibrillator?no    MEDICATIONS & ALLERGIES:    Patient reports the following regarding taking any anticoagulation/antiplatelet therapy:   Plavix, Coumadin, Eliquis, Xarelto, Lovenox, Pradaxa, Brilinta, or Effient? no Aspirin? yes (81 mg daily)  Patient confirms/reports the following medications:  Current Outpatient Prescriptions  Medication Sig Dispense Refill  . ascorbic acid (VITAMIN C) 500 MG tablet Take by mouth.    Marland Kitchen aspirin 81 MG tablet Take by mouth every other day.     . Biotin 5 MG CAPS     . cholecalciferol (VITAMIN D) 1000 units tablet Take by mouth.    Marland Kitchen FLUoxetine (PROZAC) 10 MG tablet Take 1 tablet (10 mg total) by mouth daily. 90 tablet 3  . latanoprost (XALATAN) 0.005 % ophthalmic solution 1 drop at bedtime.    . MULTIPLE VITAMIN PO Take by mouth.    . Omega-3 Fatty Acids (FISH OIL) 1000 MG CAPS     . psyllium (METAMUCIL) 58.6 % packet Take 1 packet by mouth daily.    Marland Kitchen triamterene-hydrochlorothiazide (MAXZIDE-25) 37.5-25 MG tablet Take 1 tablet by mouth daily. 90 tablet 3   No current facility-administered medications for this visit.     Patient confirms/reports the following allergies:  Allergies  Allergen Reactions  . Silver Nitrate     LIQUID NITRATE used to remove warts    No orders of the defined types were placed in this encounter.   AUTHORIZATION INFORMATION Primary Insurance: 1D#: Group #:  Secondary Insurance: 1D#: Group #:  SCHEDULE  INFORMATION: Date: 03/02/17 Time: Location:[facility]

## 2017-02-22 ENCOUNTER — Encounter: Payer: Self-pay | Admitting: *Deleted

## 2017-02-28 ENCOUNTER — Ambulatory Visit
Admission: RE | Admit: 2017-02-28 | Discharge: 2017-02-28 | Disposition: A | Discharge status: Home / Self Care | Payer: Medicare HMO | Source: Ambulatory Visit | Attending: Physician Assistant | Admitting: Physician Assistant

## 2017-02-28 DIAGNOSIS — Z1239 Encounter for other screening for malignant neoplasm of breast: Secondary | ICD-10-CM

## 2017-02-28 DIAGNOSIS — Z1231 Encounter for screening mammogram for malignant neoplasm of breast: Secondary | ICD-10-CM | POA: Diagnosis not present

## 2017-02-28 DIAGNOSIS — Z803 Family history of malignant neoplasm of breast: Secondary | ICD-10-CM

## 2017-03-01 ENCOUNTER — Telehealth: Payer: Self-pay

## 2017-03-01 NOTE — Telephone Encounter (Signed)
-----   Message from Mar Daring, [place] sent at 03/01/2017  8:24 AM EDT ----- Normal mammogram. Repeat screening in one year.

## 2017-03-01 NOTE — Discharge Instructions (Signed)
General Anesthesia, Adult, Care After °These instructions provide you with information about caring for yourself after your procedure. Your health care provider may also give you more specific instructions. Your treatment has been planned according to current medical practices, but problems sometimes occur. Call your health care provider if you have any problems or questions after your procedure. °What can I expect after the procedure? °After the procedure, it is common to have: °· Vomiting. °· A sore throat. °· Mental slowness. ° °It is common to feel: °· Nauseous. °· Cold or shivery. °· Sleepy. °· Tired. °· Sore or achy, even in parts of your body where you did not have surgery. ° °Follow these instructions at home: °For at least 24 hours after the procedure: °· Do not: °? Participate in activities where you could fall or become injured. °? Drive. °? Use heavy machinery. °? Drink alcohol. °? Take sleeping pills or medicines that cause drowsiness. °? Make important decisions or sign legal documents. °? Take care of children on your own. °· Rest. °Eating and drinking °· If you vomit, drink water, juice, or soup when you can drink without vomiting. °· Drink enough fluid to keep your urine clear or pale yellow. °· Make sure you have little or no nausea before eating solid foods. °· Follow the diet recommended by your health care provider. °General instructions °· Have a responsible adult stay with you until you are awake and alert. °· Return to your normal activities as told by your health care provider. Ask your health care provider what activities are safe for you. °· Take over-the-counter and prescription medicines only as told by your health care provider. °· If you smoke, do not smoke without supervision. °· Keep all follow-up visits as told by your health care provider. This is important. °Contact a health care provider if: °· You continue to have nausea or vomiting at home, and medicines are not helpful. °· You  cannot drink fluids or start eating again. °· You cannot urinate after 8-12 hours. °· You develop a skin rash. °· You have fever. °· You have increasing redness at the site of your procedure. °Get help right away if: °· You have difficulty breathing. °· You have chest pain. °· You have unexpected bleeding. °· You feel that you are having a life-threatening or urgent problem. °This information is not intended to replace advice given to you by your health care provider. Make sure you discuss any questions you have with your health care provider. °Document Released: 10/17/2000 Document Revised: 12/14/2015 Document Reviewed: 06/25/2015 °Elsevier Interactive Patient Education © 2018 Elsevier Inc. ° °

## 2017-03-01 NOTE — Telephone Encounter (Signed)
LMTCB  Thanks,  -Joseline 

## 2017-03-02 ENCOUNTER — Encounter
Admission: RE | Disposition: A | Discharge status: Home / Self Care | Payer: Self-pay | Source: Ambulatory Visit | Attending: Gastroenterology

## 2017-03-02 ENCOUNTER — Ambulatory Visit: Payer: Medicare HMO | Admitting: Anesthesiology

## 2017-03-02 ENCOUNTER — Ambulatory Visit
Admission: RE | Admit: 2017-03-02 | Discharge: 2017-03-02 | Disposition: A | Discharge status: Home / Self Care | Payer: Medicare HMO | Source: Ambulatory Visit | Attending: Gastroenterology | Admitting: Gastroenterology

## 2017-03-02 DIAGNOSIS — Z8042 Family history of malignant neoplasm of prostate: Secondary | ICD-10-CM | POA: Insufficient documentation

## 2017-03-02 DIAGNOSIS — M19049 Primary osteoarthritis, unspecified hand: Secondary | ICD-10-CM | POA: Diagnosis not present

## 2017-03-02 DIAGNOSIS — Z803 Family history of malignant neoplasm of breast: Secondary | ICD-10-CM | POA: Diagnosis not present

## 2017-03-02 DIAGNOSIS — F419 Anxiety disorder, unspecified: Secondary | ICD-10-CM | POA: Insufficient documentation

## 2017-03-02 DIAGNOSIS — D124 Benign neoplasm of descending colon: Secondary | ICD-10-CM | POA: Diagnosis not present

## 2017-03-02 DIAGNOSIS — I10 Essential (primary) hypertension: Secondary | ICD-10-CM | POA: Diagnosis not present

## 2017-03-02 DIAGNOSIS — Z8349 Family history of other endocrine, nutritional and metabolic diseases: Secondary | ICD-10-CM | POA: Insufficient documentation

## 2017-03-02 DIAGNOSIS — R0683 Snoring: Secondary | ICD-10-CM | POA: Diagnosis not present

## 2017-03-02 DIAGNOSIS — K641 Second degree hemorrhoids: Secondary | ICD-10-CM | POA: Insufficient documentation

## 2017-03-02 DIAGNOSIS — Z9849 Cataract extraction status, unspecified eye: Secondary | ICD-10-CM | POA: Insufficient documentation

## 2017-03-02 DIAGNOSIS — Z90710 Acquired absence of both cervix and uterus: Secondary | ICD-10-CM | POA: Diagnosis not present

## 2017-03-02 DIAGNOSIS — D122 Benign neoplasm of ascending colon: Secondary | ICD-10-CM | POA: Diagnosis not present

## 2017-03-02 DIAGNOSIS — Z85828 Personal history of other malignant neoplasm of skin: Secondary | ICD-10-CM | POA: Diagnosis not present

## 2017-03-02 DIAGNOSIS — Z79899 Other long term (current) drug therapy: Secondary | ICD-10-CM | POA: Diagnosis not present

## 2017-03-02 DIAGNOSIS — Z8249 Family history of ischemic heart disease and other diseases of the circulatory system: Secondary | ICD-10-CM | POA: Insufficient documentation

## 2017-03-02 DIAGNOSIS — Z833 Family history of diabetes mellitus: Secondary | ICD-10-CM | POA: Diagnosis not present

## 2017-03-02 DIAGNOSIS — K635 Polyp of colon: Secondary | ICD-10-CM | POA: Diagnosis not present

## 2017-03-02 DIAGNOSIS — Z7982 Long term (current) use of aspirin: Secondary | ICD-10-CM | POA: Diagnosis not present

## 2017-03-02 DIAGNOSIS — Z1211 Encounter for screening for malignant neoplasm of colon: Secondary | ICD-10-CM | POA: Diagnosis not present

## 2017-03-02 DIAGNOSIS — Z888 Allergy status to other drugs, medicaments and biological substances status: Secondary | ICD-10-CM | POA: Insufficient documentation

## 2017-03-02 HISTORY — DX: Essential (primary) hypertension: I10

## 2017-03-02 HISTORY — DX: Nausea with vomiting, unspecified: Z98.890

## 2017-03-02 HISTORY — DX: Other specified postprocedural states: R11.2

## 2017-03-02 HISTORY — PX: POLYPECTOMY: SHX5525

## 2017-03-02 HISTORY — DX: Unspecified osteoarthritis, unspecified site: M19.90

## 2017-03-02 HISTORY — PX: COLONOSCOPY WITH PROPOFOL: SHX5780

## 2017-03-02 SURGERY — COLONOSCOPY WITH PROPOFOL
Anesthesia: General

## 2017-03-02 MED ORDER — PROPOFOL 10 MG/ML IV BOLUS
INTRAVENOUS | Status: DC | PRN
  Administered 2017-03-02 (×4): 50 mg via INTRAVENOUS

## 2017-03-02 MED ORDER — LACTATED RINGERS IV SOLN
INTRAVENOUS | Status: DC
  Administered 2017-03-02: 10:00:00 via INTRAVENOUS

## 2017-03-02 MED ORDER — STERILE WATER FOR IRRIGATION IR SOLN
Status: DC | PRN
  Administered 2017-03-02: 11:00:00

## 2017-03-02 MED ORDER — ONDANSETRON HCL 4 MG/2ML IJ SOLN: 4.0000 mg | Freq: Once | INTRAMUSCULAR | Status: DC | PRN

## 2017-03-02 MED ORDER — LIDOCAINE HCL (CARDIAC) 20 MG/ML IV SOLN
INTRAVENOUS | Status: DC | PRN
  Administered 2017-03-02: 50 mg via INTRAVENOUS

## 2017-03-02 SURGICAL SUPPLY — 23 items

## 2017-03-02 NOTE — Anesthesia Preprocedure Evaluation (Signed)
Anesthesia Evaluation  Patient identified by MRN, date of birth, ID band Patient awake    Reviewed: Allergy & Precautions, NPO status , Patient's Chart, lab work & pertinent test results  History of Anesthesia Complications (+) PONV and history of anesthetic complications  Airway Mallampati: III  TM Distance: >3 FB Neck ROM: Full    Dental no notable dental hx.    Pulmonary  Snoring    Pulmonary exam normal breath sounds clear to auscultation       Cardiovascular Exercise Tolerance: Good hypertension, Normal cardiovascular exam Rhythm:Regular Rate:Normal     Neuro/Psych PSYCHIATRIC DISORDERS Anxiety negative neurological ROS     GI/Hepatic negative GI ROS,   Endo/Other  negative endocrine ROS  Renal/GU negative Renal ROS     Musculoskeletal  (+) Arthritis , Osteoarthritis,    Abdominal   Peds  Hematology negative hematology ROS (+)   Anesthesia Other Findings   Reproductive/Obstetrics                             Anesthesia Physical Anesthesia Plan  ASA: II  Anesthesia Plan: General   Post-op Pain Management:    Induction: Intravenous  PONV Risk Score and Plan: 2 and Ondansetron and Propofol infusion  Airway Management Planned: Natural Airway  Additional Equipment:   Intra-op Plan:   Post-operative Plan:   Informed Consent: I have reviewed the patients History and Physical, chart, labs and discussed the procedure including the risks, benefits and alternatives for the proposed anesthesia with the patient or authorized representative who has indicated his/her understanding and acceptance.     Plan Discussed with: CRNA  Anesthesia Plan Comments:         Anesthesia Quick Evaluation

## 2017-03-02 NOTE — Anesthesia Postprocedure Evaluation (Signed)
Anesthesia Post Note  Patient: Cindy Vega  Procedure(s) Performed: Procedure(s) (LRB): COLONOSCOPY WITH PROPOFOL (N/A) POLYPECTOMY (N/A)  Patient location during evaluation: PACU Anesthesia Type: General Level of consciousness: awake and alert, oriented and patient cooperative Pain management: pain level controlled Vital Signs Assessment: post-procedure vital signs reviewed and stable Respiratory status: spontaneous breathing, nonlabored ventilation and respiratory function stable Cardiovascular status: blood pressure returned to baseline and stable Postop Assessment: adequate PO intake Anesthetic complications: no    Darrin Nipper

## 2017-03-02 NOTE — H&P (Signed)
Cindy Lame, MD [facility] [address], [place], [place] [address] Phone: [phone] Fax : [phone]  Primary Care Physician:  Mar Daring, PA-C Primary Gastroenterologist:  Dr. Allen Norris  Pre-Procedure History & Physical: HPI:  Cindy Vega is a 71 y.o. female is here for a screening colonoscopy.   Past Medical History:  Diagnosis Date  . Arthritis    fingers  . Cancer ([place])    basal cell  . Hypertension   . PONV (postoperative nausea and vomiting)    after hysterectomy    Past Surgical History:  Procedure Laterality Date  . ABDOMINAL HYSTERECTOMY    . BREAST CYST EXCISION Right 1996   cyst removed-neg  . CATARACT EXTRACTION      Prior to Admission medications   Medication Sig Start Date End Date Taking? Authorizing Provider  aspirin 81 MG tablet Take by mouth every other day.  11/15/07  Yes [provider]  Biotin 5 MG CAPS  01/19/09  Yes [provider]  cholecalciferol (VITAMIN D) 1000 units tablet Take by mouth. 05/12/10  Yes [provider]  FLUoxetine (PROZAC) 10 MG tablet Take 1 tablet (10 mg total) by mouth daily. 11/29/16  Yes Mar Daring, PA-C  latanoprost (XALATAN) 0.005 % ophthalmic solution 1 drop at bedtime.   Yes [provider]  MULTIPLE VITAMIN PO Take by mouth. 05/12/10  Yes [provider]  Omega-3 Fatty Acids (FISH OIL) 1000 MG CAPS  11/22/07  Yes [provider]  psyllium (METAMUCIL) 58.6 % packet Take 1 packet by mouth daily.   Yes [provider]  triamterene-hydrochlorothiazide (MAXZIDE-25) 37.5-25 MG tablet Take 1 tablet by mouth daily. 11/29/16  Yes Mar Daring, PA-C    Allergies as of 12/12/2016 - Review Complete 12/05/2016  Allergen Reaction Noted  . Silver nitrate  09/02/2015    Family History  Problem Relation Age of Onset  . Breast cancer Sister 87  . Hypertension Mother   . Heart disease Mother   . Diabetes Mother   . Diabetes Father     . Prostate cancer Father   . Graves' disease Sister     Social History   Social History  . Marital status: Married    Spouse name: N/A  . Number of children: 2  . Years of education: N/A   Occupational History  . Not on file.   Social History Main Topics  . Smoking status: Never Smoker  . Smokeless tobacco: Never Used  . Alcohol use 4.2 oz/week    7 Glasses of wine per week     Comment: occasional wine  . Drug use: No  . Sexual activity: Not on file   Other Topics Concern  . Not on file   Social History Narrative  . No narrative on file    Review of Systems: See HPI, otherwise negative ROS  Physical Exam: BP (!) 158/82   Pulse 90   Temp 97.9 F (36.6 C) (Temporal)   Resp 16   Ht 5\' 5"  (1.651 m)   Wt 162 lb (73.5 kg)   SpO2 100%   BMI 26.96 kg/m  General:   Alert,  pleasant and cooperative in NAD Head:  Normocephalic and atraumatic. Neck:  Supple; no masses or thyromegaly. Lungs:  Clear throughout to auscultation.    Heart:  Regular rate and rhythm. Abdomen:  Soft, nontender and nondistended. Normal bowel sounds, without guarding, and without rebound.   Neurologic:  Alert and  oriented x4;  grossly normal neurologically.  Impression/Plan: [name] is now here to undergo a screening colonoscopy.  Risks, benefits, and alternatives regarding colonoscopy have been reviewed with the patient.  Questions have been answered.  All parties agreeable.

## 2017-03-02 NOTE — Telephone Encounter (Signed)
lmtcb

## 2017-03-02 NOTE — Op Note (Signed)
[facility] Gastroenterology Patient Name: Cindy Vega Procedure Date: 03/02/2017 10:35 AM MRN: [id number] Account #: [id number] Date of Birth: 1946/03/07 Admit Type: Outpatient Age: 71 Room: [facility] OR ROOM 01 Gender: Female Note Status: Finalized Procedure:            Colonoscopy Indications:          Screening for colorectal malignant neoplasm Providers:            Lucilla Lame MD, MD Referring MD:         Mar Daring (Referring MD) Medicines:            Propofol per Anesthesia Complications:        No immediate complications. Procedure:            Pre-Anesthesia Assessment:                       - Prior to the procedure, a History and Physical was                        performed, and patient medications and allergies were                        reviewed. The patient's tolerance of previous                        anesthesia was also reviewed. The risks and benefits of                        the procedure and the sedation options and risks were                        discussed with the patient. All questions were                        answered, and informed consent was obtained. Prior                        Anticoagulants: The patient has taken no previous                        anticoagulant or antiplatelet agents. ASA Grade                        Assessment: II - A patient with mild systemic disease.                        After reviewing the risks and benefits, the patient was                        deemed in satisfactory condition to undergo the                        procedure.                       After obtaining informed consent, the colonoscope was                        passed under direct vision. Throughout the procedure,  the patient's blood pressure, pulse, and oxygen                        saturations were monitored continuously. The [place] [id number]) was introduced through  the                        anus and advanced to the the cecum, identified by                        appendiceal orifice and ileocecal valve. The                        colonoscopy was performed without difficulty. The                        patient tolerated the procedure well. The quality of                        the bowel preparation was excellent. Findings:      The perianal and digital rectal examinations were normal.      A 3 mm polyp was found in the ascending colon. The polyp was sessile.       The polyp was removed with a cold biopsy forceps. Resection and       retrieval were complete.      A 3 mm polyp was found in the descending colon. The polyp was sessile.       The polyp was removed with a cold biopsy forceps. Resection and       retrieval were complete.      Non-bleeding internal hemorrhoids were found during retroflexion. The       hemorrhoids were Grade II (internal hemorrhoids that prolapse but reduce       spontaneously). Impression:           - One 3 mm polyp in the ascending colon, removed with a                        cold biopsy forceps. Resected and retrieved.                       - One 3 mm polyp in the descending colon, removed with                        a cold biopsy forceps. Resected and retrieved.                       - Non-bleeding internal hemorrhoids. Recommendation:       - Discharge patient to home.                       - Resume previous diet.                       - Continue present medications.                       - Await pathology results.                       -  Repeat colonoscopy in 5 years if polyp adenoma and 10                        years if hyperplastic Procedure Code(s):    --- Professional ---                       [phone], Colonoscopy, flexible; with biopsy, single or                        multiple Diagnosis Code(s):    --- Professional ---                       Z12.11, Encounter for screening for malignant neoplasm                         of colon                       D12.2, Benign neoplasm of ascending colon                       D12.4, Benign neoplasm of descending colon CPT copyright 2016 American Medical Association. All rights reserved. The codes documented in this report are preliminary and upon coder review may  be revised to meet current compliance requirements. Lucilla Lame MD, MD 03/02/2017 10:56:11 AM This report has been signed electronically. Number of Addenda: 0 Note Initiated On: 03/02/2017 10:35 AM Scope Withdrawal Time: 0 hours 6 minutes 54 seconds  Total Procedure Duration: 0 hours 11 minutes 8 seconds       [facility]

## 2017-03-02 NOTE — Transfer of Care (Signed)
Immediate Anesthesia Transfer of Care Note  Patient: Cindy Vega  Procedure(s) Performed: Procedure(s): COLONOSCOPY WITH PROPOFOL (N/A) POLYPECTOMY (N/A)  Patient Location: PACU  Anesthesia Type: General  Level of Consciousness: awake, alert  and patient cooperative  Airway and Oxygen Therapy: Patient Spontanous Breathing and Patient connected to supplemental oxygen  Post-op Assessment: Post-op Vital signs reviewed, Patient's Cardiovascular Status Stable, Respiratory Function Stable, Patent Airway and No signs of Nausea or vomiting  Post-op Vital Signs: Reviewed and stable  Complications: No apparent anesthesia complications

## 2017-03-02 NOTE — Anesthesia Procedure Notes (Signed)
Procedure Name: General with mask airway Performed by: Reno Clasby Pre-anesthesia Checklist: Patient identified, Emergency Drugs available, Suction available, Timeout performed and Patient being monitored Patient Re-evaluated:Patient Re-evaluated prior to induction Oxygen Delivery Method: Circle system utilized Preoxygenation: Pre-oxygenation with 100% oxygen Induction Type: Inhalational induction Ventilation: Mask ventilation without difficulty and Mask ventilation throughout procedure Dental Injury: Teeth and Oropharynx as per pre-operative assessment        

## 2017-03-04 ENCOUNTER — Encounter: Payer: Self-pay | Admitting: Gastroenterology

## 2017-03-06 NOTE — Telephone Encounter (Signed)
Patient has my chart active.No answer to telephone calls.  Closing chart.  -Cindy Vega

## 2017-03-10 ENCOUNTER — Encounter: Payer: Self-pay | Admitting: Physician Assistant

## 2017-03-22 ENCOUNTER — Telehealth: Payer: Self-pay

## 2017-03-22 NOTE — Telephone Encounter (Signed)
Please review pathology 

## 2017-03-23 ENCOUNTER — Encounter: Payer: Self-pay | Admitting: Gastroenterology

## 2017-04-14 DIAGNOSIS — D225 Melanocytic nevi of trunk: Secondary | ICD-10-CM | POA: Diagnosis not present

## 2017-04-14 DIAGNOSIS — D2262 Melanocytic nevi of left upper limb, including shoulder: Secondary | ICD-10-CM | POA: Diagnosis not present

## 2017-04-14 DIAGNOSIS — L821 Other seborrheic keratosis: Secondary | ICD-10-CM | POA: Diagnosis not present

## 2017-04-14 DIAGNOSIS — Z85828 Personal history of other malignant neoplasm of skin: Secondary | ICD-10-CM | POA: Diagnosis not present

## 2017-04-26 ENCOUNTER — Ambulatory Visit
Admission: EM | Admit: 2017-04-26 | Discharge: 2017-04-26 | Disposition: A | Discharge status: Home / Self Care | Payer: Medicare HMO | Attending: Family Medicine | Admitting: Family Medicine

## 2017-04-26 ENCOUNTER — Encounter: Payer: Self-pay | Admitting: *Deleted

## 2017-04-26 DIAGNOSIS — I10 Essential (primary) hypertension: Secondary | ICD-10-CM | POA: Diagnosis not present

## 2017-04-26 DIAGNOSIS — R040 Epistaxis: Secondary | ICD-10-CM

## 2017-04-26 MED ORDER — PHENYLEPHRINE HCL 0.25 % NA SOLN
1.0000 | Freq: Once | NASAL | Status: AC
  Administered 2017-04-26: 1 via NASAL

## 2017-04-26 MED ORDER — SILVER NITRATE-POT NITRATE 75-25 % EX MISC: 1.0000 "application " | Freq: Once | CUTANEOUS | Status: DC

## 2017-04-26 NOTE — Discharge Instructions (Signed)
Use medication as prescribed. Drink plenty of fluids. Monitor closely.  Follow up with your primary care physician this week as needed. Return to Urgent care for new or worsening concerns.

## 2017-04-26 NOTE — ED Provider Notes (Signed)
[facility] ____________________________________________  Time seen: Approximately 3:30 PM  I have reviewed the triage vital signs and the nursing notes.   HISTORY  Chief Complaint Epistaxis   HPI Cindy Vega is a 71 y.o. female the past medical history of hypertension, hyperlipidemia presenting with husband for evaluation of nosebleed. Patient reports that approximately 12:30 this afternoon she had a right nare nosebleed that resolved quickly with pressure, but states it then returned a few hours ago prompting her to come in to be evaluated. States slight bleeding from left nose, but primarily from the right side. Denies known trigger. Denies history of similar in the past. Denies trauma, headaches, recent congestion, sinus pressure, fevers, or seizures, weakness, dizziness, fall, now in trigger. Patient reports otherwise feels completely well. Denies any recent medicine changes. States take a baby aspirin every couple of days, does not take aspirin daily. Denies anticoagulants. Reports blood pressures normal controlled. Denies alcohol use. Denies clotting disorders. Again reports otherwise feels well.  Denies chest pain, shortness of breath, abdominal pain, dysuria, extremity pain, extremity swelling or rash. Denies recent sickness. Denies recent antibiotic use.   Mar Daring, PA-C: PCP   Past Medical History:  Diagnosis Date  . Arthritis    fingers  . Cancer ([place])    basal cell  . Hypertension   . PONV (postoperative nausea and vomiting)    after hysterectomy    Patient Active Problem List   Diagnosis Date Noted  . Special screening for malignant neoplasms, colon   . Benign neoplasm of ascending colon   . Benign neoplasm of descending colon   . Anxiety 10/02/2015  . Abnormal liver enzymes 10/02/2015  . Hypercholesteremia 10/02/2015  . Blood glucose elevated 10/02/2015  . BP (high blood pressure) 10/02/2015  . Arthritis, degenerative 10/02/2015    . Apnea, sleep 10/02/2015  . Upper respiratory infection 10/02/2015  . Insomnia 09/02/2015  . Family history of breast cancer in first degree relative 01/05/2012  . Bloodgood disease 01/05/2012  . Personal history of surgery to heart and great vessels, presenting hazards to health 01/05/2012  . H/O total hysterectomy 01/05/2012  . Glaucoma 11/22/2007  . Hay fever 06/15/2004    Past Surgical History:  Procedure Laterality Date  . ABDOMINAL HYSTERECTOMY    . BREAST CYST EXCISION Right 1996   cyst removed-neg  . CATARACT EXTRACTION    . COLONOSCOPY WITH PROPOFOL N/A 03/02/2017   Procedure: COLONOSCOPY WITH PROPOFOL;  Surgeon: Lucilla Lame, MD;  Location: [place];  Service: Endoscopy;  Laterality: N/A;  . POLYPECTOMY N/A 03/02/2017   Procedure: POLYPECTOMY;  Surgeon: Lucilla Lame, MD;  Location: [place];  Service: Endoscopy;  Laterality: N/A;     No current facility-administered medications for this encounter.   Current Outpatient Prescriptions:  .  aspirin 81 MG tablet, Take by mouth every other day. , Disp: , Rfl:  .  Biotin 5 MG CAPS, , Disp: , Rfl:  .  cholecalciferol (VITAMIN D) 1000 units tablet, Take by mouth., Disp: , Rfl:  .  FLUoxetine (PROZAC) 10 MG tablet, Take 1 tablet (10 mg total) by mouth daily., Disp: 90 tablet, Rfl: 3 .  latanoprost (XALATAN) 0.005 % ophthalmic solution, 1 drop at bedtime., Disp: , Rfl:  .  MULTIPLE VITAMIN PO, Take by mouth., Disp: , Rfl:  .  Omega-3 Fatty Acids (FISH OIL) 1000 MG CAPS, , Disp: , Rfl:  .  psyllium (METAMUCIL) 58.6 % packet, Take 1 packet by mouth daily., Disp: ,  Rfl:  .  triamterene-hydrochlorothiazide (MAXZIDE-25) 37.5-25 MG tablet, Take 1 tablet by mouth daily., Disp: 90 tablet, Rfl: 3  Allergies Silver nitrate  Family History  Problem Relation Age of Onset  . Breast cancer Sister 19  . Hypertension Mother   . Heart disease Mother   . Diabetes Mother   . Diabetes Father   . Prostate cancer Father    . Graves' disease Sister     Social History Social History  Substance Use Topics  . Smoking status: Never Smoker  . Smokeless tobacco: Never Used  . Alcohol use 4.2 oz/week    7 Glasses of wine per week     Comment: occasional wine    Review of Systems Constitutional: No fever/chills Eyes: No visual changes. ENT: No sore throat.As above. Cardiovascular: Denies chest pain. Respiratory: Denies shortness of breath. Gastrointestinal: No abdominal pain.  Genitourinary: Negative for dysuria. Musculoskeletal: Negative for back pain. Skin: Negative for rash. Neurological: Negative for headaches, focal weakness or numbness.   ____________________________________________   PHYSICAL EXAM:  VITAL SIGNS: ED Triage Vitals  Enc Vitals Group     BP 04/26/17 1457 (!) 155/80     Pulse Rate 04/26/17 1457 93     Resp 04/26/17 1457 16     Temp 04/26/17 1457 98.2 F (36.8 C)     Temp Source 04/26/17 1457 Oral     SpO2 04/26/17 1457 99 %     Weight 04/26/17 1458 159 lb (72.1 kg)     Height 04/26/17 1458 5\' 6"  (1.676 m)     Head Circumference --      Peak Flow --      Pain Score 04/26/17 1458 0     Pain Loc --      Pain Edu? --      Excl. in [place]? --     Constitutional: Alert and oriented. Well appearing and in no acute distress. Eyes: Conjunctivae are normal. PERRL.  Head: Atraumatic. No sinus tenderness to palpation. No swelling. No erythema.  Ears: no erythema, normal TMs bilaterally.   Nose:No nasal congestion. Mild dried blood present to bilateral external nares. Bilateral nares patent. Right anterior knee are superficial abrasion appearance with minimal active bleed. No other bleeding noted. Nose nontender to palpation. No facial tenderness.  Mouth/Throat: Mucous membranes are moist. No pharyngeal erythema. No tonsillar swelling or exudate.  Neck: No stridor.  No cervical spine tenderness to palpation. Hematological/Lymphatic/Immunilogical: No cervical  lymphadenopathy. Cardiovascular: Normal rate, regular rhythm. Grossly normal heart sounds.  Good peripheral circulation. Respiratory: Normal respiratory effort.  No retractions. No wheezes, rales or rhonchi. Good air movement.  Gastrointestinal: Soft and nontender. Normal Bowel sounds. No CVA tenderness. Musculoskeletal: Ambulatory with steady gait. No cervical, thoracic or lumbar tenderness to palpation. Neurologic:  Normal speech and language. No gait instability. Skin:  Skin appears warm, dry and intact. No rash noted. Psychiatric: Mood and affect are normal. Speech and behavior are normal.  ___________________________________________   LABS (all labs ordered are listed, but only abnormal results are displayed)  Labs Reviewed - No data to display ____________________________________________   PROCEDURES Procedures   Procedure explained, including medications and side affects, and verbal consent obtained from patient. Location Right anterior nare. Silver nitrate stick utilized to right anterior bleed. No bleeding afterwards. Patient tolerated well.   _________________________________________   INITIAL IMPRESSION / ASSESSMENT AND PLAN / ED COURSE  Pertinent labs & imaging results that were available during my care of the patient were reviewed by me and  considered in my medical decision making (see chart for details).  Very well-appearing patient. No acute distress. Husband at bedside. Reports while in urgent care exam room prior to arrival patient had one episode of nausea and dry heaving, states no emesis, states the bloody drainage made her sick, stomach. Denies any abdominal pain, previous nausea or diarrhea. Right anterior nosebleed. Neo-Synephrine utilized 2 sprays bilateral nares no active bleeding after use. Visualized suspected source of right anterior knee are. Discussed in detail with patient patient states her previous use of silver nitrate was spray form when removing  warts, and states that the area "festered" up and patient states her dermatologist stated this is not an allergic reaction but side effects. Patient reports never having any other reaction and requests to utilize silver nitrate in room. Patient understands risk of allergic reaction. Several nitrate stick utilized to cauterize right anterior nare, patient tolerated well. Patient observed in urgent care for at least 30 minutes after, no epistaxis repeated. Patient tolerated well. Instructed to use Neo-Synephrine 1 spray each nostril tonight prior to sleep. Encouraged supportive care, monitoring, and return parameters.Discussed indication, risks and benefits of medications with patient.  Discussed follow up with Primary care physician this week. Discussed follow up and return parameters including no resolution or any worsening concerns. Patient verbalized understanding and agreed to plan.   ____________________________________________   FINAL CLINICAL IMPRESSION(S) / ED DIAGNOSES  Final diagnoses:  Epistaxis  Right-sided epistaxis     Discharge Medication List as of 04/26/2017  4:05 PM      Note: This dictation was prepared with Dragon dictation along with smaller phrase technology. Any transcriptional errors that result from this process are unintentional.         Marylene Land, NP 04/26/17 1925

## 2017-04-26 NOTE — ED Triage Notes (Signed)
Patient started having symptom of right nostril nose bleed today at 1230. No previous history of nose bleed or sinus issues.

## 2017-06-02 DIAGNOSIS — H40053 Ocular hypertension, bilateral: Secondary | ICD-10-CM | POA: Diagnosis not present

## 2017-08-04 ENCOUNTER — Other Ambulatory Visit: Payer: Self-pay | Admitting: Physician Assistant

## 2017-08-04 DIAGNOSIS — F339 Major depressive disorder, recurrent, unspecified: Secondary | ICD-10-CM

## 2017-08-08 ENCOUNTER — Other Ambulatory Visit: Payer: Self-pay | Admitting: Physician Assistant

## 2017-08-08 DIAGNOSIS — F320 Major depressive disorder, single episode, mild: Secondary | ICD-10-CM

## 2017-08-08 MED ORDER — FLUOXETINE HCL 10 MG PO CAPS: 10.0000 mg | ORAL_CAPSULE | Freq: Every day | ORAL | 3 refills | Status: DC

## 2017-08-08 NOTE — Progress Notes (Signed)
Fluoxetine 10mg  capsule sent in

## 2017-10-04 ENCOUNTER — Other Ambulatory Visit: Payer: Self-pay | Admitting: Physician Assistant

## 2017-10-04 DIAGNOSIS — I10 Essential (primary) hypertension: Secondary | ICD-10-CM

## 2017-12-22 DIAGNOSIS — H40053 Ocular hypertension, bilateral: Secondary | ICD-10-CM | POA: Diagnosis not present

## 2018-01-17 ENCOUNTER — Other Ambulatory Visit: Payer: Self-pay | Admitting: Physician Assistant

## 2018-01-17 DIAGNOSIS — Z1231 Encounter for screening mammogram for malignant neoplasm of breast: Secondary | ICD-10-CM

## 2018-01-28 ENCOUNTER — Other Ambulatory Visit: Payer: Self-pay

## 2018-01-28 ENCOUNTER — Ambulatory Visit
Admission: EM | Admit: 2018-01-28 | Discharge: 2018-01-28 | Disposition: A | Discharge status: Home / Self Care | Payer: Medicare HMO | Attending: Family Medicine | Admitting: Family Medicine

## 2018-01-28 DIAGNOSIS — R350 Frequency of micturition: Secondary | ICD-10-CM | POA: Diagnosis not present

## 2018-01-28 DIAGNOSIS — R300 Dysuria: Secondary | ICD-10-CM

## 2018-01-28 LAB — URINALYSIS, COMPLETE (UACMP) WITH MICROSCOPIC
Glucose, UA: NEGATIVE mg/dL
KETONES UR: NEGATIVE mg/dL
Nitrite: NEGATIVE
Protein, ur: 100 mg/dL — AB
RBC / HPF: >50 RBC/hpf (ref 0–5)
SPECIFIC GRAVITY, URINE: 1.015 (ref 1.005–1.030)
pH: 7.5 (ref 5.0–8.0)

## 2018-01-28 MED ORDER — CEPHALEXIN 500 MG PO CAPS: 500.0000 mg | ORAL_CAPSULE | Freq: Two times a day (BID) | ORAL | 0 refills | Status: AC

## 2018-01-28 NOTE — ED Provider Notes (Signed)
[facility] ____________________________________________  Time seen: Approximately 11:31 AM  I have reviewed the triage vital signs and the nursing notes.   HISTORY  Chief Complaint Dysuria   HPI Cindy Vega is a 72 y.o. female presenting for evaluation of 2 days of urinary frequency, urinary urgency and some burning with urination.  Denies abdominal pain, back pain, fevers, or other accompanying symptoms.  Patient states that they did just returned home from a beach trip and reports this is a possible trigger.  States did get off her schedule was not drinking quite as much water as normal.  Continues to overall eat and drink well.  No alleviating measures attempted prior to arrival.  Reports otherwise feels well.  Denies history of urinary tract infections in the past.  Denies renal insufficiency or diabetes.Denies recent sickness. Denies recent antibiotic use.    Past Medical History:  Diagnosis Date  . Arthritis    fingers  . Cancer ([place])    basal cell  . Hypertension   . PONV (postoperative nausea and vomiting)    after hysterectomy    Patient Active Problem List   Diagnosis Date Noted  . Special screening for malignant neoplasms, colon   . Benign neoplasm of ascending colon   . Benign neoplasm of descending colon   . Anxiety 10/02/2015  . Abnormal liver enzymes 10/02/2015  . Hypercholesteremia 10/02/2015  . Blood glucose elevated 10/02/2015  . BP (high blood pressure) 10/02/2015  . Arthritis, degenerative 10/02/2015  . Apnea, sleep 10/02/2015  . Upper respiratory infection 10/02/2015  . Insomnia 09/02/2015  . Family history of breast cancer in first degree relative 01/05/2012  . Bloodgood disease 01/05/2012  . Personal history of surgery to heart and great vessels, presenting hazards to health 01/05/2012  . H/O total hysterectomy 01/05/2012  . Glaucoma 11/22/2007  . Hay fever 06/15/2004    Past Surgical History:  Procedure Laterality Date    . ABDOMINAL HYSTERECTOMY    . BREAST CYST EXCISION Right 1996   cyst removed-neg  . CATARACT EXTRACTION    . COLONOSCOPY WITH PROPOFOL N/A 03/02/2017   Procedure: COLONOSCOPY WITH PROPOFOL;  Surgeon: Lucilla Lame, MD;  Location: [place];  Service: Endoscopy;  Laterality: N/A;  . POLYPECTOMY N/A 03/02/2017   Procedure: POLYPECTOMY;  Surgeon: Lucilla Lame, MD;  Location: [place];  Service: Endoscopy;  Laterality: N/A;     No current facility-administered medications for this encounter.   Current Outpatient Medications:  .  aspirin 81 MG tablet, Take by mouth every other day. , Disp: , Rfl:  .  Biotin 5 MG CAPS, , Disp: , Rfl:  .  cephALEXin (KEFLEX) 500 MG capsule, Take 1 capsule (500 mg total) by mouth 2 (two) times daily for 7 days., Disp: 14 capsule, Rfl: 0 .  cholecalciferol (VITAMIN D) 1000 units tablet, Take by mouth., Disp: , Rfl:  .  FLUoxetine (PROZAC) 10 MG capsule, Take 1 capsule (10 mg total) by mouth daily., Disp: 90 capsule, Rfl: 3 .  latanoprost (XALATAN) 0.005 % ophthalmic solution, 1 drop at bedtime., Disp: , Rfl:  .  MULTIPLE VITAMIN PO, Take by mouth., Disp: , Rfl:  .  Omega-3 Fatty Acids (FISH OIL) 1000 MG CAPS, , Disp: , Rfl:  .  psyllium (METAMUCIL) 58.6 % packet, Take 1 packet by mouth daily., Disp: , Rfl:  .  triamterene-hydrochlorothiazide (MAXZIDE-25) 37.5-25 MG tablet, TAKE 1 TABLET EVERY DAY, Disp: 90 tablet, Rfl: 3  Allergies Silver nitrate  Family History  Problem Relation Age of Onset  . Breast cancer Sister 52  . Hypertension Mother   . Heart disease Mother   . Diabetes Mother   . Diabetes Father   . Prostate cancer Father   . Graves' disease Sister     Social History Social History   Tobacco Use  . Smoking status: Never Smoker  . Smokeless tobacco: Never Used  Substance Use Topics  . Alcohol use: Yes    Alcohol/week: 4.2 oz    Types: 7 Glasses of wine per week    Comment: occasional wine  . Drug use: No     Review of Systems Constitutional: No fever/chills Cardiovascular: Denies chest pain. Respiratory: Denies shortness of breath. Gastrointestinal: No abdominal pain.  No nausea, no vomiting.  No diarrhea.  No constipation. Genitourinary: positive for dysuria. Musculoskeletal: Negative for back pain. Skin: Negative for rash.   ____________________________________________   PHYSICAL EXAM:  VITAL SIGNS: ED Triage Vitals  Enc Vitals Group     BP 01/28/18 1008 (!) 149/88     Pulse Rate 01/28/18 1008 92     Resp 01/28/18 1008 16     Temp 01/28/18 1008 98.1 F (36.7 C)     Temp Source 01/28/18 1008 Oral     SpO2 01/28/18 1008 100 %     Weight 01/28/18 1010 160 lb (72.6 kg)     Height 01/28/18 1010 5\' 6"  (1.676 m)     Head Circumference --      Peak Flow --      Pain Score 01/28/18 1008 1     Pain Loc --      Pain Edu? --      Excl. in [place]? --     Constitutional: Alert and oriented. Well appearing and in no acute distress. ENT      Head: Normocephalic and atraumatic. Cardiovascular: Normal rate, regular rhythm. Grossly normal heart sounds.  Good peripheral circulation. Respiratory: Normal respiratory effort without tachypnea nor retractions. Breath sounds are clear and equal bilaterally. No wheezes, rales, rhonchi. Gastrointestinal: Soft and nontender. No CVA tenderness. Musculoskeletal:No midline cervical, thoracic or lumbar tenderness to palpation.  Neurologic:  Normal speech and language.Speech is normal. No gait instability.  Skin:  Skin is warm, dry and intact. No rash noted. Psychiatric: Mood and affect are normal. Speech and behavior are normal. Patient exhibits appropriate insight and judgment   ___________________________________________   LABS (all labs ordered are listed, but only abnormal results are displayed)  Labs Reviewed  URINALYSIS, COMPLETE (UACMP) WITH MICROSCOPIC - Abnormal; Notable for the following components:      Result Value   APPearance  CLOUDY (*)    Hgb urine dipstick LARGE (*)    Bilirubin Urine SMALL (*)    Protein, ur 100 (*)    Leukocytes, UA MODERATE (*)    Bacteria, UA MANY (*)    All other components within normal limits  URINE CULTURE   ____________________________________________   PROCEDURES Procedures   INITIAL IMPRESSION / ASSESSMENT AND PLAN / ED COURSE  Pertinent labs & imaging results that were available during my care of the patient were reviewed by me and considered in my medical decision making (see chart for details).  Well-appearing patient.  No acute distress.  Urinalysis reviewed, suspect UTI.  We will culture.  Will empirically start patient on oral Keflex.  Encourage rest, fluids, supportive care.Discussed indication, risks and benefits of medications with patient.  Discussed follow up with Primary care physician this week. Discussed follow up  and return parameters including no resolution or any worsening concerns. Patient verbalized understanding and agreed to plan.   ____________________________________________   FINAL CLINICAL IMPRESSION(S) / ED DIAGNOSES  Final diagnoses:  Dysuria     ED Discharge Orders        Ordered    cephALEXin (KEFLEX) 500 MG capsule  2 times daily     01/28/18 1057       Note: This dictation was prepared with Dragon dictation along with smaller phrase technology. Any transcriptional errors that result from this process are unintentional.         Marylene Land, NP 01/28/18 1133

## 2018-01-28 NOTE — ED Triage Notes (Signed)
Pt with cloudy urine, dysuria, and frequency starting Friday.

## 2018-01-28 NOTE — Discharge Instructions (Addendum)
Take medication as prescribed. Rest. Drink plenty of fluids.  ° °Follow up with your primary care physician this week as needed. Return to Urgent care for new or worsening concerns.  ° °

## 2018-01-30 LAB — URINE CULTURE

## 2018-02-06 ENCOUNTER — Ambulatory Visit: Payer: Medicare HMO

## 2018-02-07 ENCOUNTER — Ambulatory Visit (INDEPENDENT_AMBULATORY_CARE_PROVIDER_SITE_OTHER): Payer: Medicare HMO

## 2018-02-07 VITALS — BP 144/74 | HR 94 | Temp 98.7°F | Ht 65.0 in | Wt 164.0 lb

## 2018-02-07 DIAGNOSIS — Z0000 Encounter for general adult medical examination without abnormal findings: Secondary | ICD-10-CM | POA: Diagnosis not present

## 2018-02-07 NOTE — Progress Notes (Signed)
Subjective:   Cindy Vega is a 72 y.o. female who presents for Medicare Annual (Subsequent) preventive examination.  Review of Systems:  N/A   Cardiac Risk Factors include: advanced age (>40men, >51 women);hypertension     Objective:     Vitals: BP (!) 144/74 (BP Location: Right Arm)   Pulse 94   Temp 98.7 F (37.1 C) (Oral)   Ht 5\' 5"  (1.651 m)   Wt 164 lb (74.4 kg)   BMI 27.29 kg/m   Body mass index is 27.29 kg/m.  Advanced Directives 02/07/2018 01/28/2018 03/02/2017 12/05/2016 12/03/2015 10/02/2015  Does Patient Have a Medical Advance Directive? Yes Yes Yes No Yes No  Type of Advance Directive Healthcare Power of [place] -  Does patient want to make changes to medical advance directive? - - Yes (MAU/Ambulatory/Procedural Areas - Information given) - - -  Copy of [place] in Chart? No - copy requested - - - - -    Tobacco Social History   Tobacco Use  Smoking Status Never Smoker  Smokeless Tobacco Never Used     Counseling given: Not Answered   Clinical Intake:  Pre-visit preparation completed: Yes  Pain : No/denies pain Pain Score: 0-No pain     Nutritional Status: BMI 25 -29 Overweight Nutritional Risks: None Diabetes: No  How often do you need to have someone help you when you read instructions, pamphlets, or other written materials from your doctor or pharmacy?: 1 - Never  Interpreter Needed?: No  Information entered by :: [facility], LPN  Past Medical History:  Diagnosis Date  . Arthritis    fingers  . Cancer ([place])    basal cell  . Hypertension   . PONV (postoperative nausea and vomiting)    after hysterectomy   Past Surgical History:  Procedure Laterality Date  . ABDOMINAL HYSTERECTOMY    . BREAST CYST EXCISION Right 1996   cyst removed-neg  . CATARACT EXTRACTION    . COLONOSCOPY WITH PROPOFOL N/A 03/02/2017   Procedure: COLONOSCOPY WITH PROPOFOL;   Surgeon: Lucilla Lame, MD;  Location: [place];  Service: Endoscopy;  Laterality: N/A;  . POLYPECTOMY N/A 03/02/2017   Procedure: POLYPECTOMY;  Surgeon: Lucilla Lame, MD;  Location: [place];  Service: Endoscopy;  Laterality: N/A;   Family History  Problem Relation Age of Onset  . Breast cancer Sister 21  . Hypertension Mother   . Heart disease Mother   . Diabetes Mother   . Diabetes Father   . Prostate cancer Father   . Graves' disease Sister    Social History   Socioeconomic History  . Marital status: Married    Spouse name: Not on file  . Number of children: 2  . Years of education: Not on file  . Highest education level: Bachelor's degree (e.g., BA, AB, BS)  Occupational History  . Occupation: retired  Scientific laboratory technician  . Financial resource strain: Not hard at all  . Food insecurity:    Worry: Never true    Inability: Never true  . Transportation needs:    Medical: No    Non-medical: No  Tobacco Use  . Smoking status: Never Smoker  . Smokeless tobacco: Never Used  Substance and Sexual Activity  . Alcohol use: Yes    Alcohol/week: 4.2 oz    Types: 7 Glasses of wine per week  . Drug use: No  . Sexual activity: Not on file  Lifestyle  . Physical activity:    Days per week: Not on file    Minutes per session: Not on file  . Stress: Not at all  Relationships  . Social connections:    Talks on phone: Not on file    Gets together: Not on file    Attends religious service: Not on file    Active member of club or organization: Not on file    Attends meetings of clubs or organizations: Not on file    Relationship status: Not on file  Other Topics Concern  . Not on file  Social History Narrative  . Not on file    Outpatient Encounter Medications as of 02/07/2018  Medication Sig  . aspirin 81 MG tablet Take by mouth every other day.   . Biotin 5 MG CAPS   . cholecalciferol (VITAMIN D) 1000 units tablet Take by mouth.  Marland Kitchen FLUoxetine (PROZAC) 10 MG  capsule Take 1 capsule (10 mg total) by mouth daily.  Marland Kitchen latanoprost (XALATAN) 0.005 % ophthalmic solution 1 drop at bedtime.  . MULTIPLE VITAMIN PO Take by mouth.  . Omega-3 Fatty Acids (FISH OIL) 1000 MG CAPS   . psyllium (METAMUCIL) 58.6 % packet Take 1 packet by mouth daily.  Marland Kitchen triamterene-hydrochlorothiazide (MAXZIDE-25) 37.5-25 MG tablet TAKE 1 TABLET EVERY DAY   No facility-administered encounter medications on file as of 02/07/2018.     Activities of Daily Living In your present state of health, do you have any difficulty performing the following activities: 02/07/2018 03/02/2017  Hearing? N N  Vision? N N  Difficulty concentrating or making decisions? N N  Walking or climbing stairs? N N  Dressing or bathing? N N  Doing errands, shopping? N -  Preparing Food and eating ? N -  Using the Toilet? N -  In the past six months, have you accidently leaked urine? N -  Do you have problems with loss of bowel control? N -  Managing your Medications? N -  Managing your Finances? N -  Housekeeping or managing your Housekeeping? N -  Some recent data might be hidden    Patient Care Team: Rubye Beach as PCP - General (Family Medicine) Eulogio Bear, MD as Consulting Physician (Ophthalmology)    Assessment:   This is a routine wellness examination for Cindy Vega.  Exercise Activities and Dietary recommendations Current Exercise Habits: Home exercise routine;Structured exercise class, Type of exercise: Other - see comments("rowing machine"), Time (Minutes): 10, Frequency (Times/Week): 3, Weekly Exercise (Minutes/Week): 30, Intensity: Mild, Exercise limited by: None identified  Goals    . DIET - INCREASE WATER INTAKE     Recommend increasing water intake to 6-8 glasses a day.        Fall Risk Fall Risk  02/07/2018 12/05/2016 12/03/2015  Falls in the past year? No No No   Is the patient's home free of loose throw rugs in walkways, pet beds, electrical cords, etc?    yes      Grab bars in the bathroom? yes      Handrails on the stairs?   no      Adequate lighting?   yes  Timed Get Up and Go performed: N/A  Depression Screen PHQ 2/9 Scores 02/07/2018 02/07/2018 12/05/2016 12/03/2015  PHQ - 2 Score 0 0 0 0  PHQ- 9 Score 0 - 1 -     Cognitive Function: Pt declined screening today.         Immunization History  Administered Date(s) Administered  . Influenza, High Dose Seasonal PF 05/13/2014, 04/01/2016, 05/03/2017  . Influenza,inj,Quad PF,6+ Mos 05/08/2013  . Influenza-Unspecified 05/26/2015  . Pneumococcal Conjugate-13 05/13/2014  . Pneumococcal Polysaccharide-23 05/08/2013  . Td 04/25/2003  . Tdap 08/18/2010  . Zoster 06/02/2008    Qualifies for Shingles Vaccine? Due for Shingles vaccine. Declined my offer to administer today. Education has been provided regarding the importance of this vaccine. Pt has been advised to call her insurance company to determine her out of pocket expense. Advised she may also receive this vaccine at her local pharmacy or Health Dept. Verbalized acceptance and understanding.  Screening Tests Health Maintenance  Topic Date Due  . INFLUENZA VACCINE  02/22/2018  . MAMMOGRAM  03/01/2019  . TETANUS/TDAP  08/18/2020  . COLONOSCOPY  03/02/2022  . DEXA SCAN  Completed  . Hepatitis C Screening  Completed  . PNA vac Low Risk Adult  Completed    Cancer Screenings: Lung: Low Dose CT Chest recommended if Age 50-80 years, 30 pack-year currently smoking OR have quit w/in 15years. Patient does not qualify. Breast:  Up to date on Mammogram? Yes   Up to date of Bone Density/Dexa? Yes Colorectal: Up to date  Additional Screenings:  Hepatitis C Screening: Up to date     Plan:  I have personally reviewed and addressed the Medicare Annual Wellness questionnaire and have noted the following in the patient's chart:  A. Medical and social history B. Use of alcohol, tobacco or illicit drugs  C. Current medications and  supplements D. Functional ability and status E.  Nutritional status F.  Physical activity G. Advance directives H. List of other physicians I.  Hospitalizations, surgeries, and ER visits in previous 12 months J.  [place] such as hearing and vision if needed, cognitive and depression L. Referrals and appointments - none  In addition, I have reviewed and discussed with patient certain preventive protocols, quality metrics, and best practice recommendations. A written personalized care plan for preventive services as well as general preventive health recommendations were provided to patient.  See attached scanned questionnaire for additional information.   Signed,  Fabio Neighbors, LPN Nurse Health Advisor   Nurse Recommendations: None.

## 2018-02-07 NOTE — Patient Instructions (Signed)
Cindy Vega , Thank you for taking time to come for your Medicare Wellness Visit. I appreciate your ongoing commitment to your health goals. Please review the following plan we discussed and let me know if I can assist you in the future.   Screening recommendations/referrals: Colonoscopy: Up to date Mammogram: Up to date Bone Density: Up to date Recommended yearly ophthalmology/optometry visit for glaucoma screening and checkup Recommended yearly dental visit for hygiene and checkup  Vaccinations: Influenza vaccine: Up to date Pneumococcal vaccine: Up to date Tdap vaccine: Up to date Shingles vaccine: Pt declines today.     Advanced directives: Please bring a copy of your POA (Power of Attorney) and/or Living Will to your next appointment.   Conditions/risks identified: Recommend increasing water intake to 6-8 glasses a day.   Next appointment: 03/02/18 @ 9 AM with Fenton Malling.   Preventive Care 71 Years and Older, Female Preventive care refers to lifestyle choices and visits with your health care provider that can promote health and wellness. What does preventive care include?  A yearly physical exam. This is also called an annual well check.  Dental exams once or twice a year.  Routine eye exams. Ask your health care provider how often you should have your eyes checked.  Personal lifestyle choices, including:  Daily care of your teeth and gums.  Regular physical activity.  Eating a healthy diet.  Avoiding tobacco and drug use.  Limiting alcohol use.  Practicing safe sex.  Taking low-dose aspirin every day.  Taking vitamin and mineral supplements as recommended by your health care provider. What happens during an annual well check? The services and screenings done by your health care provider during your annual well check will depend on your age, overall health, lifestyle risk factors, and family history of disease. Counseling  Your health care provider  may ask you questions about your:  Alcohol use.  Tobacco use.  Drug use.  Emotional well-being.  Home and relationship well-being.  Sexual activity.  Eating habits.  History of falls.  Memory and ability to understand (cognition).  Work and work Statistician.  Reproductive health. Screening  You may have the following tests or measurements:  Height, weight, and BMI.  Blood pressure.  Lipid and cholesterol levels. These may be checked every 5 years, or more frequently if you are over 73 years old.  Skin check.  Lung cancer screening. You may have this screening every year starting at age 56 if you have a 30-pack-year history of smoking and currently smoke or have quit within the past 15 years.  Fecal occult blood test (FOBT) of the stool. You may have this test every year starting at age 30.  Flexible sigmoidoscopy or colonoscopy. You may have a sigmoidoscopy every 5 years or a colonoscopy every 10 years starting at age 58.  Hepatitis C blood test.  Hepatitis B blood test.  Sexually transmitted disease (STD) testing.  Diabetes screening. This is done by checking your blood sugar (glucose) after you have not eaten for a while (fasting). You may have this done every 1-3 years.  Bone density scan. This is done to screen for osteoporosis. You may have this done starting at age 54.  Mammogram. This may be done every 1-2 years. Talk to your health care provider about how often you should have regular mammograms. Talk with your health care provider about your test results, treatment options, and if necessary, the need for more tests. Vaccines  Your health care provider may recommend  certain vaccines, such as:  Influenza vaccine. This is recommended every year.  Tetanus, diphtheria, and acellular pertussis (Tdap, Td) vaccine. You may need a Td booster every 10 years.  Zoster vaccine. You may need this after age 40.  Pneumococcal 13-valent conjugate (PCV13) vaccine.  One dose is recommended after age 73.  Pneumococcal polysaccharide (PPSV23) vaccine. One dose is recommended after age 47. Talk to your health care provider about which screenings and vaccines you need and how often you need them. This information is not intended to replace advice given to you by your health care provider. Make sure you discuss any questions you have with your health care provider. Document Released: 08/07/2015 Document Revised: 03/30/2016 Document Reviewed: 05/12/2015 Elsevier Interactive Patient Education  2017 [place] Prevention in the Home Falls can cause injuries. They can happen to people of all ages. There are many things you can do to make your home safe and to help prevent falls. What can I do on the outside of my home?  Regularly fix the edges of walkways and driveways and fix any cracks.  Remove anything that might make you trip as you walk through a door, such as a raised step or threshold.  Trim any bushes or trees on the path to your home.  Use bright outdoor lighting.  Clear any walking paths of anything that might make someone trip, such as rocks or tools.  Regularly check to see if handrails are loose or broken. Make sure that both sides of any steps have handrails.  Any raised decks and porches should have guardrails on the edges.  Have any leaves, snow, or ice cleared regularly.  Use sand or salt on walking paths during winter.  Clean up any spills in your garage right away. This includes oil or grease spills. What can I do in the bathroom?  Use night lights.  Install grab bars by the toilet and in the tub and shower. Do not use towel bars as grab bars.  Use non-skid mats or decals in the tub or shower.  If you need to sit down in the shower, use a plastic, non-slip stool.  Keep the floor dry. Clean up any water that spills on the floor as soon as it happens.  Remove soap buildup in the tub or shower regularly.  Attach bath  mats securely with double-sided non-slip rug tape.  Do not have throw rugs and other things on the floor that can make you trip. What can I do in the bedroom?  Use night lights.  Make sure that you have a light by your bed that is easy to reach.  Do not use any sheets or blankets that are too big for your bed. They should not hang down onto the floor.  Have a firm chair that has side arms. You can use this for support while you get dressed.  Do not have throw rugs and other things on the floor that can make you trip. What can I do in the kitchen?  Clean up any spills right away.  Avoid walking on wet floors.  Keep items that you use a lot in easy-to-reach places.  If you need to reach something above you, use a strong step stool that has a grab bar.  Keep electrical cords out of the way.  Do not use floor polish or wax that makes floors slippery. If you must use wax, use non-skid floor wax.  Do not have throw rugs and other  things on the floor that can make you trip. What can I do with my stairs?  Do not leave any items on the stairs.  Make sure that there are handrails on both sides of the stairs and use them. Fix handrails that are broken or loose. Make sure that handrails are as long as the stairways.  Check any carpeting to make sure that it is firmly attached to the stairs. Fix any carpet that is loose or worn.  Avoid having throw rugs at the top or bottom of the stairs. If you do have throw rugs, attach them to the floor with carpet tape.  Make sure that you have a light switch at the top of the stairs and the bottom of the stairs. If you do not have them, ask someone to add them for you. What else can I do to help prevent falls?  Wear shoes that:  Do not have high heels.  Have rubber bottoms.  Are comfortable and fit you well.  Are closed at the toe. Do not wear sandals.  If you use a stepladder:  Make sure that it is fully opened. Do not climb a closed  stepladder.  Make sure that both sides of the stepladder are locked into place.  Ask someone to hold it for you, if possible.  Clearly mark and make sure that you can see:  Any grab bars or handrails.  First and last steps.  Where the edge of each step is.  Use tools that help you move around (mobility aids) if they are needed. These include:  Canes.  Walkers.  Scooters.  Crutches.  Turn on the lights when you go into a dark area. Replace any light bulbs as soon as they burn out.  Set up your furniture so you have a clear path. Avoid moving your furniture around.  If any of your floors are uneven, fix them.  If there are any pets around you, be aware of where they are.  Review your medicines with your doctor. Some medicines can make you feel dizzy. This can increase your chance of falling. Ask your doctor what other things that you can do to help prevent falls. This information is not intended to replace advice given to you by your health care provider. Make sure you discuss any questions you have with your health care provider. Document Released: 05/07/2009 Document Revised: 12/17/2015 Document Reviewed: 08/15/2014 Elsevier Interactive Patient Education  2017 [employer].

## 2018-03-02 ENCOUNTER — Ambulatory Visit (INDEPENDENT_AMBULATORY_CARE_PROVIDER_SITE_OTHER): Payer: Medicare HMO | Admitting: Physician Assistant

## 2018-03-02 ENCOUNTER — Encounter: Payer: Self-pay | Admitting: Physician Assistant

## 2018-03-02 VITALS — BP 140/80 | HR 80 | Temp 98.0°F | Resp 16 | Ht 66.0 in | Wt 164.0 lb

## 2018-03-02 DIAGNOSIS — R400 Somnolence: Secondary | ICD-10-CM | POA: Diagnosis not present

## 2018-03-02 DIAGNOSIS — R739 Hyperglycemia, unspecified: Secondary | ICD-10-CM

## 2018-03-02 DIAGNOSIS — R0683 Snoring: Secondary | ICD-10-CM

## 2018-03-02 DIAGNOSIS — Z6826 Body mass index (BMI) 26.0-26.9, adult: Secondary | ICD-10-CM | POA: Diagnosis not present

## 2018-03-02 DIAGNOSIS — E7800 Pure hypercholesterolemia, unspecified: Secondary | ICD-10-CM

## 2018-03-02 DIAGNOSIS — Z0000 Encounter for general adult medical examination without abnormal findings: Secondary | ICD-10-CM

## 2018-03-02 NOTE — Patient Instructions (Signed)
Health Maintenance for Postmenopausal Women Menopause is a normal process in which your reproductive ability comes to an end. This process happens gradually over a span of months to years, usually between the ages of 22 and 9. Menopause is complete when you have missed 12 consecutive menstrual periods. It is important to talk with your health care provider about some of the most common conditions that affect postmenopausal women, such as heart disease, cancer, and bone loss (osteoporosis). Adopting a healthy lifestyle and getting preventive care can help to promote your health and wellness. Those actions can also lower your chances of developing some of these common conditions. What should I know about menopause? During menopause, you may experience a number of symptoms, such as:  Moderate-to-severe hot flashes.  Night sweats.  Decrease in sex drive.  Mood swings.  Headaches.  Tiredness.  Irritability.  Memory problems.  Insomnia.  Choosing to treat or not to treat menopausal changes is an individual decision that you make with your health care provider. What should I know about hormone replacement therapy and supplements? Hormone therapy products are effective for treating symptoms that are associated with menopause, such as hot flashes and night sweats. Hormone replacement carries certain risks, especially as you become older. If you are thinking about using estrogen or estrogen with progestin treatments, discuss the benefits and risks with your health care provider. What should I know about heart disease and stroke? Heart disease, heart attack, and stroke become more likely as you age. This may be due, in part, to the hormonal changes that your body experiences during menopause. These can affect how your body processes dietary fats, triglycerides, and cholesterol. Heart attack and stroke are both medical emergencies. There are many things that you can do to help prevent heart disease  and stroke:  Have your blood pressure checked at least every 1-2 years. High blood pressure causes heart disease and increases the risk of stroke.  If you are 53-22 years old, ask your health care provider if you should take aspirin to prevent a heart attack or a stroke.  Do not use any tobacco products, including cigarettes, chewing tobacco, or electronic cigarettes. If you need help quitting, ask your health care provider.  It is important to eat a healthy diet and maintain a healthy weight. ? Be sure to include plenty of vegetables, fruits, low-fat dairy products, and lean protein. ? Avoid eating foods that are high in solid fats, added sugars, or salt (sodium).  Get regular exercise. This is one of the most important things that you can do for your health. ? Try to exercise for at least 150 minutes each week. The type of exercise that you do should increase your heart rate and make you sweat. This is known as moderate-intensity exercise. ? Try to do strengthening exercises at least twice each week. Do these in addition to the moderate-intensity exercise.  Know your numbers.Ask your health care provider to check your cholesterol and your blood glucose. Continue to have your blood tested as directed by your health care provider.  What should I know about cancer screening? There are several types of cancer. Take the following steps to reduce your risk and to catch any cancer development as early as possible. Breast Cancer  Practice breast self-awareness. ? This means understanding how your breasts normally appear and feel. ? It also means doing regular breast self-exams. Let your health care provider know about any changes, no matter how small.  If you are 40  or older, have a clinician do a breast exam (clinical breast exam or CBE) every year. Depending on your age, family history, and medical history, it may be recommended that you also have a yearly breast X-ray (mammogram).  If you  have a family history of breast cancer, talk with your health care provider about genetic screening.  If you are at high risk for breast cancer, talk with your health care provider about having an MRI and a mammogram every year.  Breast cancer (BRCA) gene test is recommended for women who have family members with BRCA-related cancers. Results of the assessment will determine the need for genetic counseling and BRCA1 and for BRCA2 testing. BRCA-related cancers include these types: ? Breast. This occurs in males or females. ? Ovarian. ? Tubal. This may also be called fallopian tube cancer. ? Cancer of the abdominal or pelvic lining (peritoneal cancer). ? Prostate. ? Pancreatic.  Cervical, Uterine, and Ovarian Cancer Your health care provider may recommend that you be screened regularly for cancer of the pelvic organs. These include your ovaries, uterus, and vagina. This screening involves a pelvic exam, which includes checking for microscopic changes to the surface of your cervix (Pap test).  For women ages 21-65, health care providers may recommend a pelvic exam and a Pap test every three years. For women ages 79-65, they may recommend the Pap test and pelvic exam, combined with testing for human papilloma virus (HPV), every five years. Some types of HPV increase your risk of cervical cancer. Testing for HPV may also be done on women of any age who have unclear Pap test results.  Other health care providers may not recommend any screening for nonpregnant women who are considered low risk for pelvic cancer and have no symptoms. Ask your health care provider if a screening pelvic exam is right for you.  If you have had past treatment for cervical cancer or a condition that could lead to cancer, you need Pap tests and screening for cancer for at least 20 years after your treatment. If Pap tests have been discontinued for you, your risk factors (such as having a new sexual partner) need to be  reassessed to determine if you should start having screenings again. Some women have medical problems that increase the chance of getting cervical cancer. In these cases, your health care provider may recommend that you have screening and Pap tests more often.  If you have a family history of uterine cancer or ovarian cancer, talk with your health care provider about genetic screening.  If you have vaginal bleeding after reaching menopause, tell your health care provider.  There are currently no reliable tests available to screen for ovarian cancer.  Lung Cancer Lung cancer screening is recommended for adults 69-62 years old who are at high risk for lung cancer because of a history of smoking. A yearly low-dose CT scan of the lungs is recommended if you:  Currently smoke.  Have a history of at least 30 pack-years of smoking and you currently smoke or have quit within the past 15 years. A pack-year is smoking an average of one pack of cigarettes per day for one year.  Yearly screening should:  Continue until it has been 15 years since you quit.  Stop if you develop a health problem that would prevent you from having lung cancer treatment.  Colorectal Cancer  This type of cancer can be detected and can often be prevented.  Routine colorectal cancer screening usually begins at  age 42 and continues through age 45.  If you have risk factors for colon cancer, your health care provider may recommend that you be screened at an earlier age.  If you have a family history of colorectal cancer, talk with your health care provider about genetic screening.  Your health care provider may also recommend using home test kits to check for hidden blood in your stool.  A small camera at the end of a tube can be used to examine your colon directly (sigmoidoscopy or colonoscopy). This is done to check for the earliest forms of colorectal cancer.  Direct examination of the colon should be repeated every  5-10 years until age 71. However, if early forms of precancerous polyps or small growths are found or if you have a family history or genetic risk for colorectal cancer, you may need to be screened more often.  Skin Cancer  Check your skin from head to toe regularly.  Monitor any moles. Be sure to tell your health care provider: ? About any new moles or changes in moles, especially if there is a change in a mole's shape or color. ? If you have a mole that is larger than the size of a pencil eraser.  If any of your family members has a history of skin cancer, especially at a young age, talk with your health care provider about genetic screening.  Always use sunscreen. Apply sunscreen liberally and repeatedly throughout the day.  Whenever you are outside, protect yourself by wearing long sleeves, pants, a wide-brimmed hat, and sunglasses.  What should I know about osteoporosis? Osteoporosis is a condition in which bone destruction happens more quickly than new bone creation. After menopause, you may be at an increased risk for osteoporosis. To help prevent osteoporosis or the bone fractures that can happen because of osteoporosis, the following is recommended:  If you are 46-71 years old, get at least 1,000 mg of calcium and at least 600 mg of vitamin D per day.  If you are older than age 55 but younger than age 65, get at least 1,200 mg of calcium and at least 600 mg of vitamin D per day.  If you are older than age 54, get at least 1,200 mg of calcium and at least 800 mg of vitamin D per day.  Smoking and excessive alcohol intake increase the risk of osteoporosis. Eat foods that are rich in calcium and vitamin D, and do weight-bearing exercises several times each week as directed by your health care provider. What should I know about how menopause affects my mental health? Depression may occur at any age, but it is more common as you become older. Common symptoms of depression  include:  Low or sad mood.  Changes in sleep patterns.  Changes in appetite or eating patterns.  Feeling an overall lack of motivation or enjoyment of activities that you previously enjoyed.  Frequent crying spells.  Talk with your health care provider if you think that you are experiencing depression. What should I know about immunizations? It is important that you get and maintain your immunizations. These include:  Tetanus, diphtheria, and pertussis (Tdap) booster vaccine.  Influenza every year before the flu season begins.  Pneumonia vaccine.  Shingles vaccine.  Your health care provider may also recommend other immunizations. This information is not intended to replace advice given to you by your health care provider. Make sure you discuss any questions you have with your health care provider. Document Released: 09/02/2005  Document Revised: 01/29/2016 Document Reviewed: 04/14/2015 Elsevier Interactive Patient Education  2018 Elsevier Inc.  

## 2018-03-02 NOTE — Progress Notes (Signed)
Patient: Cindy Vega, Female    DOB: 1946-07-08, 72 y.o.   MRN: [id number] Visit Date: 03/02/2018  Today's Provider: Mar Daring, PA-C   Chief Complaint  Patient presents with  . Annual Exam   Subjective:     Complete Physical Cindy Vega is a 72 y.o. female. She feels well. She reports exercising. She reports she is sleeping well.  Mammogram scheduled 03/07/18 Colonoscopy:03/02/17 Polyps, Path Report Tubular Adenoma Hyperplastic Polyp BMD:02/25/16-Osteopenia Hep C screening:11/14/13 DTO:[id number] removed.Cervix and ovaries were left. ----------------------------------------------------------- Patient also c/o snoring at night. She does have some daytime somnolence, but reports it is improving. Epworth score is 2 today. Reports she wants to get a sleep study.   Review of Systems  Constitutional: Negative.   HENT: Negative.   Eyes: Negative.   Respiratory: Negative.   Cardiovascular: Negative.   Gastrointestinal: Negative.   Endocrine: Negative.   Genitourinary: Negative.   Musculoskeletal: Negative.   Skin: Negative.   Allergic/Immunologic: Negative.   Neurological: Negative.   Hematological: Negative.   Psychiatric/Behavioral: Negative.     Social History   Socioeconomic History  . Marital status: Married    Spouse name: Not on file  . Number of children: 2  . Years of education: Not on file  . Highest education level: Bachelor's degree (e.g., BA, AB, BS)  Occupational History  . Occupation: retired  Scientific laboratory technician  . Financial resource strain: Not hard at all  . Food insecurity:    Worry: Never true    Inability: Never true  . Transportation needs:    Medical: No    Non-medical: No  Tobacco Use  . Smoking status: Never Smoker  . Smokeless tobacco: Never Used  Substance and Sexual Activity  . Alcohol use: Yes    Alcohol/week: 7.0 standard drinks    Types: 7 Glasses of wine per week  . Drug use: No  . Sexual  activity: Not on file  Lifestyle  . Physical activity:    Days per week: Not on file    Minutes per session: Not on file  . Stress: Not at all  Relationships  . Social connections:    Talks on phone: Not on file    Gets together: Not on file    Attends religious service: Not on file    Active member of club or organization: Not on file    Attends meetings of clubs or organizations: Not on file    Relationship status: Not on file  . Intimate partner violence:    Fear of current or ex partner: Not on file    Emotionally abused: Not on file    Physically abused: Not on file    Forced sexual activity: Not on file  Other Topics Concern  . Not on file  Social History Narrative  . Not on file    Past Medical History:  Diagnosis Date  . Arthritis    fingers  . Cancer ([place])    basal cell  . Hypertension   . PONV (postoperative nausea and vomiting)    after hysterectomy     Patient Active Problem List   Diagnosis Date Noted  . Special screening for malignant neoplasms, colon   . Benign neoplasm of ascending colon   . Benign neoplasm of descending colon   . Anxiety 10/02/2015  . Abnormal liver enzymes 10/02/2015  . Hypercholesteremia 10/02/2015  . Blood glucose elevated 10/02/2015  . BP (high blood pressure) 10/02/2015  . Arthritis,  degenerative 10/02/2015  . Apnea, sleep 10/02/2015  . Upper respiratory infection 10/02/2015  . Insomnia 09/02/2015  . Family history of breast cancer in first degree relative 01/05/2012  . Bloodgood disease 01/05/2012  . Personal history of surgery to heart and great vessels, presenting hazards to health 01/05/2012  . H/O total hysterectomy 01/05/2012  . Glaucoma 11/22/2007  . Hay fever 06/15/2004    Past Surgical History:  Procedure Laterality Date  . ABDOMINAL HYSTERECTOMY    . BREAST CYST EXCISION Right 1996   cyst removed-neg  . CATARACT EXTRACTION    . COLONOSCOPY WITH PROPOFOL N/A 03/02/2017   Procedure: COLONOSCOPY WITH  PROPOFOL;  Surgeon: Lucilla Lame, MD;  Location: [facility];  Service: Endoscopy;  Laterality: N/A;  . POLYPECTOMY N/A 03/02/2017   Procedure: POLYPECTOMY;  Surgeon: Lucilla Lame, MD;  Location: [facility];  Service: Endoscopy;  Laterality: N/A;    Her family history includes Breast cancer (age of onset: 36) in her sister; Diabetes in her father and mother; Berenice Primas' disease in her sister; Heart disease in her mother; Hypertension in her mother; Prostate cancer in her father.      Current Outpatient Medications:  .  aspirin 81 MG tablet, Take by mouth every other day. , Disp: , Rfl:  .  Biotin 5 MG CAPS, , Disp: , Rfl:  .  cholecalciferol (VITAMIN D) 1000 units tablet, Take by mouth., Disp: , Rfl:  .  FLUoxetine (PROZAC) 10 MG capsule, Take 1 capsule (10 mg total) by mouth daily., Disp: 90 capsule, Rfl: 3 .  latanoprost (XALATAN) 0.005 % ophthalmic solution, 1 drop at bedtime., Disp: , Rfl:  .  MULTIPLE VITAMIN PO, Take by mouth., Disp: , Rfl:  .  Omega-3 Fatty Acids (FISH OIL) 1000 MG CAPS, , Disp: , Rfl:  .  psyllium (METAMUCIL) 58.6 % packet, Take 1 packet by mouth daily., Disp: , Rfl:  .  triamterene-hydrochlorothiazide (MAXZIDE-25) 37.5-25 MG tablet, TAKE 1 TABLET EVERY DAY, Disp: 90 tablet, Rfl: 3  Patient Care Team: Mar Daring, PA-C as PCP - General (Family Medicine) Eulogio Bear, MD as Consulting Physician (Ophthalmology)     Objective:   Vitals: BP 140/80 (BP Location: Left Arm, Patient Position: Sitting, Cuff Size: Normal)   Pulse 80   Temp 98 F (36.7 C) (Oral)   Resp 16   Ht 5\' 6"  (1.676 m)   Wt 164 lb (74.4 kg)   SpO2 98%   BMI 26.47 kg/m   Physical Exam  Constitutional: She is oriented to person, place, and time. She appears well-developed and well-nourished. No distress.  HENT:  Head: Normocephalic and atraumatic.  Right Ear: Hearing, tympanic membrane, external ear and ear canal normal.  Left Ear: Hearing, tympanic membrane,  external ear and ear canal normal.  Nose: Nose normal.  Mouth/Throat: Uvula is midline, oropharynx is clear and moist and mucous membranes are normal. No oropharyngeal exudate.  Eyes: Pupils are equal, round, and reactive to light. Conjunctivae and EOM are normal. Right eye exhibits no discharge. Left eye exhibits no discharge. No scleral icterus.  Neck: Normal range of motion. Neck supple. No JVD present. No tracheal deviation present. No thyromegaly present.  Cardiovascular: Normal rate, regular rhythm, normal heart sounds and intact distal pulses. Exam reveals no gallop and no friction rub.  No murmur heard. Pulmonary/Chest: Effort normal and breath sounds normal. No respiratory distress. She has no wheezes. She has no rales. She exhibits no tenderness. Right breast exhibits no inverted nipple, no  mass, no nipple discharge, no skin change and no tenderness. Left breast exhibits no inverted nipple, no mass, no nipple discharge, no skin change and no tenderness. No breast swelling, tenderness, discharge or bleeding. Breasts are symmetrical.  Abdominal: Soft. Bowel sounds are normal. She exhibits no distension and no mass. There is no tenderness. There is no rebound and no guarding.  Musculoskeletal: Normal range of motion. She exhibits no edema or tenderness.  Lymphadenopathy:    She has no cervical adenopathy.  Neurological: She is alert and oriented to person, place, and time.  Skin: Skin is warm and dry. No rash noted. She is not diaphoretic.  Psychiatric: She has a normal mood and affect. Her behavior is normal. Judgment and thought content normal.  Vitals reviewed.   Activities of Daily Living In your present state of health, do you have any difficulty performing the following activities: 02/07/2018 03/02/2017  Hearing? N N  Vision? N N  Difficulty concentrating or making decisions? N N  Walking or climbing stairs? N N  Dressing or bathing? N N  Doing errands, shopping? N -  Preparing  Food and eating ? N -  Using the Toilet? N -  In the past six months, have you accidently leaked urine? N -  Do you have problems with loss of bowel control? N -  Managing your Medications? N -  Managing your Finances? N -  Housekeeping or managing your Housekeeping? N -  Some recent data might be hidden    Fall Risk Assessment Fall Risk  02/07/2018 12/05/2016 12/03/2015  Falls in the past year? No No No     Depression Screen PHQ 2/9 Scores 02/07/2018 02/07/2018 12/05/2016 12/03/2015  PHQ - 2 Score 0 0 0 0  PHQ- 9 Score 0 - 1 -    Cognitive Testing - 6-CIT:Pt declined screening with [facility] on 02/07/18   Assessment & Plan:    Annual Physical Reviewed patient's Family Medical History Reviewed and updated list of patient's medical providers Assessment of cognitive impairment was done Assessed patient's functional ability Established a written schedule for health screening [place] Completed and Reviewed  Exercise Activities and Dietary recommendations Goals    . DIET - INCREASE WATER INTAKE     Recommend increasing water intake to 6-8 glasses a day.        Immunization History  Administered Date(s) Administered  . Influenza, High Dose Seasonal PF 05/13/2014, 04/01/2016, 05/03/2017  . Influenza,inj,Quad PF,6+ Mos 05/08/2013  . Influenza-Unspecified 05/26/2015  . Pneumococcal Conjugate-13 05/13/2014  . Pneumococcal Polysaccharide-23 05/08/2013  . Td 04/25/2003  . Tdap 08/18/2010  . Zoster 06/02/2008  . Zoster Recombinat (Shingrix) 02/21/2018    Health Maintenance  Topic Date Due  . INFLUENZA VACCINE  02/22/2018  . MAMMOGRAM  03/01/2019  . TETANUS/TDAP  08/18/2020  . COLONOSCOPY  03/02/2022  . DEXA SCAN  Completed  . Hepatitis C Screening  Completed  . PNA vac Low Risk Adult  Completed     Discussed health benefits of physical activity, and encouraged her to engage in regular exercise appropriate for her age and condition.    1. Annual  physical exam Normal physical exam today. Will check labs as below and f/u pending lab results. If labs are stable and WNL she will not need to have these rechecked for one year at her next annual physical exam. She is to call the office in the meantime if she has any acute issue, questions or concerns. - CBC with Differential/Platelet -  Comprehensive metabolic panel - TSH  2. Hypercholesteremia Diet controlled. Will check labs as below and f/u pending results. - Lipid panel  3. Blood glucose elevated Diet controlled. Will check labs as below and f/u pending results. - Hemoglobin A1c  4. Snoring Sleep study ordered for patient. More suspicious for upper airway obstruction than obstructive sleep apnea but will get sleep study to determine.  - Home sleep test; Future  5. Daytime somnolence See above medical treatment plan. - Home sleep test; Future  6. BMI 26.0-26.9,adult Counseled patient on healthy lifestyle modifications including dieting and exercise.  - Home sleep test; Future  ------------------------------------------------------------------------------------------------------------    Mar Daring, PA-C  [facility]

## 2018-03-03 LAB — CBC WITH DIFFERENTIAL/PLATELET
BASOS: 0 %
Basophils Absolute: 0 10*3/uL (ref 0.0–0.2)
EOS (ABSOLUTE): 0.2 10*3/uL (ref 0.0–0.4)
Eos: 3 %
HEMOGLOBIN: 13.6 g/dL (ref 11.1–15.9)
Hematocrit: 38.8 % (ref 34.0–46.6)
IMMATURE GRANS (ABS): 0 10*3/uL (ref 0.0–0.1)
Immature Granulocytes: 0 %
LYMPHS: 39 %
Lymphocytes Absolute: 2.1 10*3/uL (ref 0.7–3.1)
MCH: 32.4 pg (ref 26.6–33.0)
MCHC: 35.1 g/dL (ref 31.5–35.7)
MCV: 92 fL (ref 79–97)
MONOCYTES: 10 %
Monocytes Absolute: 0.6 10*3/uL (ref 0.1–0.9)
NEUTROS ABS: 2.6 10*3/uL (ref 1.4–7.0)
Neutrophils: 48 %
Platelets: 306 10*3/uL (ref 150–450)
RBC: 4.2 x10E6/uL (ref 3.77–5.28)
RDW: 13.7 % (ref 12.3–15.4)
WBC: 5.4 10*3/uL (ref 3.4–10.8)

## 2018-03-03 LAB — HEMOGLOBIN A1C
Est. average glucose Bld gHb Est-mCnc: 120 mg/dL
Hgb A1c MFr Bld: 5.8 % — ABNORMAL HIGH (ref 4.8–5.6)

## 2018-03-03 LAB — LIPID PANEL
CHOL/HDL RATIO: 3.2 ratio (ref 0.0–4.4)
Cholesterol, Total: 241 mg/dL — ABNORMAL HIGH (ref 100–199)
HDL: 75 mg/dL (ref >39)
LDL Calculated: 133 mg/dL — ABNORMAL HIGH (ref 0–99)
Triglycerides: 165 mg/dL — ABNORMAL HIGH (ref 0–149)
VLDL CHOLESTEROL CAL: 33 mg/dL (ref 5–40)

## 2018-03-03 LAB — COMPREHENSIVE METABOLIC PANEL
A/G RATIO: 1.8 (ref 1.2–2.2)
ALBUMIN: 4.5 g/dL (ref 3.5–4.8)
ALT: 19 IU/L (ref 0–32)
AST: 19 IU/L (ref 0–40)
Alkaline Phosphatase: 47 IU/L (ref 39–117)
BILIRUBIN TOTAL: 0.4 mg/dL (ref 0.0–1.2)
BUN / CREAT RATIO: 20 (ref 12–28)
BUN: 19 mg/dL (ref 8–27)
CHLORIDE: 97 mmol/L (ref 96–106)
CO2: 25 mmol/L (ref 20–29)
Calcium: 9.8 mg/dL (ref 8.7–10.3)
Creatinine, Ser: 0.96 mg/dL (ref 0.57–1.00)
GFR calc non Af Amer: 60 mL/min/{1.73_m2} (ref >59)
GFR, EST AFRICAN AMERICAN: 69 mL/min/{1.73_m2} (ref >59)
Globulin, Total: 2.5 g/dL (ref 1.5–4.5)
Glucose: 101 mg/dL — ABNORMAL HIGH (ref 65–99)
POTASSIUM: 4.4 mmol/L (ref 3.5–5.2)
Sodium: 138 mmol/L (ref 134–144)
TOTAL PROTEIN: 7 g/dL (ref 6.0–8.5)

## 2018-03-03 LAB — TSH: TSH: 2.16 u[IU]/mL (ref 0.450–4.500)

## 2018-03-05 ENCOUNTER — Telehealth: Payer: Self-pay

## 2018-03-05 NOTE — Telephone Encounter (Signed)
lmtcb

## 2018-03-05 NOTE — Telephone Encounter (Signed)
Pt returned call  Thanks, [employer]

## 2018-03-05 NOTE — Telephone Encounter (Signed)
-----   Message from Mar Daring, [place] sent at 03/05/2018 12:53 PM EDT ----- Blood count is normal. Kidney and liver function are normal. Sugar is doing better (A1c down to 5.8 from 6.0). Cholesterol remains elevated but unchanged from last year. Thyroid is normal.

## 2018-03-05 NOTE — Telephone Encounter (Signed)
Patient advised as below.  

## 2018-03-07 ENCOUNTER — Ambulatory Visit
Admission: RE | Admit: 2018-03-07 | Discharge: 2018-03-07 | Disposition: A | Discharge status: Home / Self Care | Payer: Medicare HMO | Source: Ambulatory Visit | Attending: Physician Assistant | Admitting: Physician Assistant

## 2018-03-07 DIAGNOSIS — Z1231 Encounter for screening mammogram for malignant neoplasm of breast: Secondary | ICD-10-CM | POA: Diagnosis not present

## 2018-03-08 ENCOUNTER — Telehealth: Payer: Self-pay

## 2018-03-08 NOTE — Telephone Encounter (Signed)
Patient advised as directed below.  Thanks,  -Claudell Rhody 

## 2018-03-08 NOTE — Telephone Encounter (Signed)
-----   Message from Mar Daring, [place] sent at 03/07/2018  3:42 PM EDT ----- Normal mammogram. Repeat screening in one year.

## 2018-03-21 ENCOUNTER — Telehealth: Payer: Self-pay | Admitting: Physician Assistant

## 2018-03-21 NOTE — Telephone Encounter (Signed)
Order for home sleep study faxed to [facility]

## 2018-04-13 DIAGNOSIS — L821 Other seborrheic keratosis: Secondary | ICD-10-CM | POA: Diagnosis not present

## 2018-04-13 DIAGNOSIS — Z85828 Personal history of other malignant neoplasm of skin: Secondary | ICD-10-CM | POA: Diagnosis not present

## 2018-04-13 DIAGNOSIS — D1801 Hemangioma of skin and subcutaneous tissue: Secondary | ICD-10-CM | POA: Diagnosis not present

## 2018-04-13 DIAGNOSIS — D2371 Other benign neoplasm of skin of right lower limb, including hip: Secondary | ICD-10-CM | POA: Diagnosis not present

## 2018-04-13 DIAGNOSIS — Z08 Encounter for follow-up examination after completed treatment for malignant neoplasm: Secondary | ICD-10-CM | POA: Diagnosis not present

## 2018-05-15 ENCOUNTER — Telehealth: Payer: Self-pay | Admitting: Physician Assistant

## 2018-05-15 NOTE — Telephone Encounter (Signed)
Pt calling to check on the sleep study she had requested back in August w/ Tawanna Sat.  Please call pt back to give status.  Thanks, [employer]

## 2018-05-15 NOTE — Telephone Encounter (Signed)
Do you know the update on the sleep study? Please see Telephone encounter of 03/21/18.

## 2018-05-15 NOTE — Telephone Encounter (Signed)
I never received any results on this sleep study.

## 2018-05-18 NOTE — Telephone Encounter (Signed)
Pt is scheduled for home sleep study 05/29/18 through [facility]

## 2018-05-28 ENCOUNTER — Encounter: Payer: Self-pay | Admitting: Physician Assistant

## 2018-05-28 DIAGNOSIS — G4733 Obstructive sleep apnea (adult) (pediatric): Secondary | ICD-10-CM | POA: Diagnosis not present

## 2018-05-28 DIAGNOSIS — R0602 Shortness of breath: Secondary | ICD-10-CM | POA: Diagnosis not present

## 2018-05-28 LAB — PULMONARY FUNCTION TEST

## 2018-05-29 ENCOUNTER — Encounter: Payer: Self-pay | Admitting: Physician Assistant

## 2018-05-29 DIAGNOSIS — G4733 Obstructive sleep apnea (adult) (pediatric): Secondary | ICD-10-CM | POA: Diagnosis not present

## 2018-05-29 DIAGNOSIS — R0602 Shortness of breath: Secondary | ICD-10-CM | POA: Diagnosis not present

## 2018-06-05 DIAGNOSIS — G4733 Obstructive sleep apnea (adult) (pediatric): Secondary | ICD-10-CM | POA: Diagnosis not present

## 2018-06-29 DIAGNOSIS — H40053 Ocular hypertension, bilateral: Secondary | ICD-10-CM | POA: Diagnosis not present

## 2018-07-05 DIAGNOSIS — G4733 Obstructive sleep apnea (adult) (pediatric): Secondary | ICD-10-CM | POA: Diagnosis not present

## 2018-08-05 DIAGNOSIS — G4733 Obstructive sleep apnea (adult) (pediatric): Secondary | ICD-10-CM | POA: Diagnosis not present

## 2018-09-05 DIAGNOSIS — G4733 Obstructive sleep apnea (adult) (pediatric): Secondary | ICD-10-CM | POA: Diagnosis not present

## 2018-10-04 DIAGNOSIS — G4733 Obstructive sleep apnea (adult) (pediatric): Secondary | ICD-10-CM | POA: Diagnosis not present

## 2018-10-11 ENCOUNTER — Other Ambulatory Visit: Payer: Self-pay | Admitting: Physician Assistant

## 2018-10-11 DIAGNOSIS — I10 Essential (primary) hypertension: Secondary | ICD-10-CM

## 2018-10-11 DIAGNOSIS — F320 Major depressive disorder, single episode, mild: Secondary | ICD-10-CM

## 2018-11-04 DIAGNOSIS — G4733 Obstructive sleep apnea (adult) (pediatric): Secondary | ICD-10-CM | POA: Diagnosis not present

## 2018-12-04 DIAGNOSIS — G4733 Obstructive sleep apnea (adult) (pediatric): Secondary | ICD-10-CM | POA: Diagnosis not present

## 2018-12-28 DIAGNOSIS — H40053 Ocular hypertension, bilateral: Secondary | ICD-10-CM | POA: Diagnosis not present

## 2019-01-04 DIAGNOSIS — G4733 Obstructive sleep apnea (adult) (pediatric): Secondary | ICD-10-CM | POA: Diagnosis not present

## 2019-01-28 ENCOUNTER — Other Ambulatory Visit: Payer: Self-pay | Admitting: Physician Assistant

## 2019-01-28 DIAGNOSIS — Z1231 Encounter for screening mammogram for malignant neoplasm of breast: Secondary | ICD-10-CM

## 2019-02-03 DIAGNOSIS — G4733 Obstructive sleep apnea (adult) (pediatric): Secondary | ICD-10-CM | POA: Diagnosis not present

## 2019-02-13 ENCOUNTER — Other Ambulatory Visit: Payer: Self-pay

## 2019-02-13 ENCOUNTER — Ambulatory Visit (INDEPENDENT_AMBULATORY_CARE_PROVIDER_SITE_OTHER): Payer: Medicare HMO

## 2019-02-13 DIAGNOSIS — Z0000 Encounter for general adult medical examination without abnormal findings: Secondary | ICD-10-CM

## 2019-02-13 NOTE — Patient Instructions (Signed)
[name] , Thank you for taking time to come for your Medicare Wellness Visit. I appreciate your ongoing commitment to your health goals. Please review the following plan we discussed and let me know if I can assist you in the future.   Screening recommendations/referrals: Colonoscopy: Up to date, due 02/2022 Mammogram: Up to date, due 02/2020 Bone Density: Up to date, due 02/2021 Recommended yearly ophthalmology/optometry visit for glaucoma screening and checkup Recommended yearly dental visit for hygiene and checkup  Vaccinations: Influenza vaccine: Up to date Pneumococcal vaccine: Completed series Tdap vaccine: Up to date, due 07/2020 Shingles vaccine: Completed series    Advanced directives: Please bring a copy of your POA (Power of [place]) and/or Living Will to your next appointment.   Conditions/risks identified: Continue to increase water intake to 6-8 8 oz glasses a day.   Next appointment: 03/06/19 @ 9:00 AM with Fenton Malling.   Preventive Care 72 Years and Older, Female Preventive care refers to lifestyle choices and visits with your health care provider that can promote health and wellness. What does preventive care include?  A yearly physical exam. This is also called an annual well check.  Dental exams once or twice a year.  Routine eye exams. Ask your health care provider how often you should have your eyes checked.  Personal lifestyle choices, including:  Daily care of your teeth and gums.  Regular physical activity.  Eating a healthy diet.  Avoiding tobacco and drug use.  Limiting alcohol use.  Practicing safe sex.  Taking low-dose aspirin every day.  Taking vitamin and mineral supplements as recommended by your health care provider. What happens during an annual well check? The services and screenings done by your health care provider during your annual well check will depend on your age, overall health, lifestyle risk factors, and  family history of disease. Counseling  Your health care provider may ask you questions about your:  Alcohol use.  Tobacco use.  Drug use.  Emotional well-being.  Home and relationship well-being.  Sexual activity.  Eating habits.  History of falls.  Memory and ability to understand (cognition).  Work and work Statistician.  Reproductive health. Screening  You may have the following tests or measurements:  Height, weight, and BMI.  Blood pressure.  Lipid and cholesterol levels. These may be checked every 5 years, or more frequently if you are over 84 years old.  Skin check.  Lung cancer screening. You may have this screening every year starting at age 83 if you have a 30-pack-year history of smoking and currently smoke or have quit within the past 15 years.  Fecal occult blood test (FOBT) of the stool. You may have this test every year starting at age 56.  Flexible sigmoidoscopy or colonoscopy. You may have a sigmoidoscopy every 5 years or a colonoscopy every 10 years starting at age 63.  Hepatitis C blood test.  Hepatitis B blood test.  Sexually transmitted disease (STD) testing.  Diabetes screening. This is done by checking your blood sugar (glucose) after you have not eaten for a while (fasting). You may have this done every 1-3 years.  Bone density scan. This is done to screen for osteoporosis. You may have this done starting at age 72.  Mammogram. This may be done every 1-2 years. Talk to your health care provider about how often you should have regular mammograms. Talk with your health care provider about your test results, treatment options, and if necessary, the need for more tests.  Vaccines  Your health care provider may recommend certain vaccines, such as:  Influenza vaccine. This is recommended every year.  Tetanus, diphtheria, and acellular pertussis (Tdap, Td) vaccine. You may need a Td booster every 10 years.  Zoster vaccine. You may need this  after age 45.  Pneumococcal 13-valent conjugate (PCV13) vaccine. One dose is recommended after age 40.  Pneumococcal polysaccharide (PPSV23) vaccine. One dose is recommended after age 72. Talk to your health care provider about which screenings and vaccines you need and how often you need them. This information is not intended to replace advice given to you by your health care provider. Make sure you discuss any questions you have with your health care provider. Document Released: 08/07/2015 Document Revised: 03/30/2016 Document Reviewed: 05/12/2015 Elsevier Interactive Patient Education  2017 [place] Prevention in the Home Falls can cause injuries. They can happen to people of all ages. There are many things you can do to make your home safe and to help prevent falls. What can I do on the outside of my home?  Regularly fix the edges of walkways and driveways and fix any cracks.  Remove anything that might make you trip as you walk through a door, such as a raised step or threshold.  Trim any bushes or trees on the path to your home.  Use bright outdoor lighting.  Clear any walking paths of anything that might make someone trip, such as rocks or tools.  Regularly check to see if handrails are loose or broken. Make sure that both sides of any steps have handrails.  Any raised decks and porches should have guardrails on the edges.  Have any leaves, snow, or ice cleared regularly.  Use sand or salt on walking paths during winter.  Clean up any spills in your garage right away. This includes oil or grease spills. What can I do in the bathroom?  Use night lights.  Install grab bars by the toilet and in the tub and shower. Do not use towel bars as grab bars.  Use non-skid mats or decals in the tub or shower.  If you need to sit down in the shower, use a plastic, non-slip stool.  Keep the floor dry. Clean up any water that spills on the floor as soon as it happens.   Remove soap buildup in the tub or shower regularly.  Attach bath mats securely with double-sided non-slip rug tape.  Do not have throw rugs and other things on the floor that can make you trip. What can I do in the bedroom?  Use night lights.  Make sure that you have a light by your bed that is easy to reach.  Do not use any sheets or blankets that are too big for your bed. They should not hang down onto the floor.  Have a firm chair that has side arms. You can use this for support while you get dressed.  Do not have throw rugs and other things on the floor that can make you trip. What can I do in the kitchen?  Clean up any spills right away.  Avoid walking on wet floors.  Keep items that you use a lot in easy-to-reach places.  If you need to reach something above you, use a strong step stool that has a grab bar.  Keep electrical cords out of the way.  Do not use floor polish or wax that makes floors slippery. If you must use wax, use non-skid floor wax.  Do not have throw rugs and other things on the floor that can make you trip. What can I do with my stairs?  Do not leave any items on the stairs.  Make sure that there are handrails on both sides of the stairs and use them. Fix handrails that are broken or loose. Make sure that handrails are as long as the stairways.  Check any carpeting to make sure that it is firmly attached to the stairs. Fix any carpet that is loose or worn.  Avoid having throw rugs at the top or bottom of the stairs. If you do have throw rugs, attach them to the floor with carpet tape.  Make sure that you have a light switch at the top of the stairs and the bottom of the stairs. If you do not have them, ask someone to add them for you. What else can I do to help prevent falls?  Wear shoes that:  Do not have high heels.  Have rubber bottoms.  Are comfortable and fit you well.  Are closed at the toe. Do not wear sandals.  If you use a  stepladder:  Make sure that it is fully opened. Do not climb a closed stepladder.  Make sure that both sides of the stepladder are locked into place.  Ask someone to hold it for you, if possible.  Clearly mark and make sure that you can see:  Any grab bars or handrails.  First and last steps.  Where the edge of each step is.  Use tools that help you move around (mobility aids) if they are needed. These include:  Canes.  Walkers.  Scooters.  Crutches.  Turn on the lights when you go into a dark area. Replace any light bulbs as soon as they burn out.  Set up your furniture so you have a clear path. Avoid moving your furniture around.  If any of your floors are uneven, fix them.  If there are any pets around you, be aware of where they are.  Review your medicines with your doctor. Some medicines can make you feel dizzy. This can increase your chance of falling. Ask your doctor what other things that you can do to help prevent falls. This information is not intended to replace advice given to you by your health care provider. Make sure you discuss any questions you have with your health care provider. Document Released: 05/07/2009 Document Revised: 12/17/2015 Document Reviewed: 08/15/2014 Elsevier Interactive Patient Education  2017 [employer].

## 2019-02-13 NOTE — Progress Notes (Signed)
Subjective:   Cindy Vega is a 73 y.o. female who presents for Medicare Annual (Subsequent) preventive examination.    This visit is being conducted through telemedicine due to the COVID-19 pandemic. This patient has given me verbal consent via doximity to conduct this visit, patient states they are participating from their home address. Some vital signs may be absent or patient reported.    Patient identification: identified by name, DOB, and current address  Review of Systems:  N/A  Cardiac Risk Factors include: advanced age (>75men, >63 women);hypertension     Objective:     Vitals: There were no vitals taken for this visit.  There is no height or weight on file to calculate BMI. Unable to obtain vitals due to visit being conducted via telephonically.   Advanced Directives 02/13/2019 02/07/2018 01/28/2018 03/02/2017 12/05/2016 12/03/2015 10/02/2015  Does Patient Have a Medical Advance Directive? Yes Yes Yes Yes No Yes No  Type of Advance Directive Healthcare Power of [place] -  Does patient want to make changes to medical advance directive? - - - Yes (MAU/Ambulatory/Procedural Areas - Information given) - - -  Copy of [place] in Chart? No - copy requested No - copy requested - - - - -    Tobacco Social History   Tobacco Use  Smoking Status Never Smoker  Smokeless Tobacco Never Used     Counseling given: Not Answered   Clinical Intake:  Pre-visit preparation completed: Yes  Pain Score: 0-No pain     Nutritional Risks: None  How often do you need to have someone help you when you read instructions, pamphlets, or other written materials from your doctor or pharmacy?: 1 - Never  Interpreter Needed?: No  Information entered by :: [facility], LPN  Past Medical History:  Diagnosis Date  . Arthritis    fingers  . Cancer ([place])    basal cell  .  Hypertension   . PONV (postoperative nausea and vomiting)    after hysterectomy   Past Surgical History:  Procedure Laterality Date  . ABDOMINAL HYSTERECTOMY    . BREAST CYST EXCISION Right 1996   cyst removed-neg  . CATARACT EXTRACTION    . COLONOSCOPY WITH PROPOFOL N/A 03/02/2017   Procedure: COLONOSCOPY WITH PROPOFOL;  Surgeon: Lucilla Lame, MD;  Location: [facility];  Service: Endoscopy;  Laterality: N/A;  . POLYPECTOMY N/A 03/02/2017   Procedure: POLYPECTOMY;  Surgeon: Lucilla Lame, MD;  Location: [facility];  Service: Endoscopy;  Laterality: N/A;   Family History  Problem Relation Age of Onset  . Breast cancer Sister 65  . Hypertension Mother   . Heart disease Mother   . Diabetes Mother   . Diabetes Father   . Prostate cancer Father   . Graves' disease Sister   . Breast cancer Sister    Social History   Socioeconomic History  . Marital status: Married    Spouse name: Not on file  . Number of children: 2  . Years of education: Not on file  . Highest education level: Bachelor's degree (e.g., BA, AB, BS)  Occupational History  . Occupation: retired  Scientific laboratory technician  . Financial resource strain: Not hard at all  . Food insecurity    Worry: Never true    Inability: Never true  . Transportation needs    Medical: No    Non-medical: No  Tobacco Use  . Smoking  status: Never Smoker  . Smokeless tobacco: Never Used  Substance and Sexual Activity  . Alcohol use: Yes    Alcohol/week: 3.0 standard drinks    Types: 3 Glasses of wine per week  . Drug use: No  . Sexual activity: Not on file  Lifestyle  . Physical activity    Days per week: 0 days    Minutes per session: 0 min  . Stress: Not at all  Relationships  . Social Herbalist on phone: Patient refused    Gets together: Patient refused    Attends religious service: Patient refused    Active member of club or organization: Patient refused    Attends meetings of clubs or organizations:  Patient refused    Relationship status: Patient refused  Other Topics Concern  . Not on file  Social History Narrative  . Not on file    Outpatient Encounter Medications as of 02/13/2019  Medication Sig  . aspirin 81 MG tablet Take by mouth every other day.   . Biotin 5 MG CAPS daily.   . cholecalciferol (VITAMIN D) 1000 units tablet Take 1,000 Units by mouth daily.   Marland Kitchen FLUoxetine (PROZAC) 10 MG capsule TAKE 1 CAPSULE EVERY DAY  . latanoprost (XALATAN) 0.005 % ophthalmic solution 1 drop at bedtime.  . MULTIPLE VITAMIN PO Take by mouth daily.   . Omega-3 Fatty Acids (FISH OIL) 1000 MG CAPS daily.   . psyllium (METAMUCIL) 58.6 % packet Take 1 packet by mouth daily.  Marland Kitchen triamterene-hydrochlorothiazide (MAXZIDE-25) 37.5-25 MG tablet TAKE 1 TABLET EVERY DAY   No facility-administered encounter medications on file as of 02/13/2019.     Activities of Daily Living In your present state of health, do you have any difficulty performing the following activities: 02/13/2019  Hearing? N  Vision? N  Difficulty concentrating or making decisions? N  Walking or climbing stairs? N  Dressing or bathing? N  Doing errands, shopping? N  Preparing Food and eating ? N  Using the Toilet? N  In the past six months, have you accidently leaked urine? N  Do you have problems with loss of bowel control? N  Managing your Medications? N  Managing your Finances? N  Housekeeping or managing your Housekeeping? N  Some recent data might be hidden    Patient Care Team: Rubye Beach as PCP - General (Family Medicine) Eulogio Bear, MD as Consulting Physician (Ophthalmology)    Assessment:   This is a routine wellness examination for Cindy Vega.  Exercise Activities and Dietary recommendations Current Exercise Habits: The patient does not participate in regular exercise at present, Exercise limited by: None identified  Goals    . DIET - INCREASE WATER INTAKE     Recommend increasing water  intake to 6-8 glasses a day.        Fall Risk: Fall Risk  02/13/2019 02/07/2018 12/05/2016 12/03/2015  Falls in the past year? 0 No No No    FALL RISK PREVENTION PERTAINING TO THE HOME:  Any stairs in or around the home? Yes  If so, are there any without handrails? No   Home free of loose throw rugs in walkways, pet beds, electrical cords, etc? Yes  Adequate lighting in your home to reduce risk of falls? Yes   ASSISTIVE DEVICES UTILIZED TO PREVENT FALLS:  Life alert? No  Use of a cane, walker or w/c? No  Grab bars in the bathroom? Yes  Shower chair or bench in shower? No  Elevated toilet seat or a handicapped toilet? Yes    TIMED UP AND GO:  Was the test performed? No .    Depression Screen PHQ 2/9 Scores 02/13/2019 02/07/2018 02/07/2018 12/05/2016  PHQ - 2 Score 0 0 0 0  PHQ- 9 Score - 0 - 1     Cognitive Function: Declined today.         Immunization History  Administered Date(s) Administered  . Influenza, High Dose Seasonal PF 05/13/2014, 04/01/2016, 05/03/2017  . Influenza,inj,Quad PF,6+ Mos 05/08/2013  . Influenza-Unspecified 05/26/2015, 04/17/2018  . Pneumococcal Conjugate-13 05/13/2014  . Pneumococcal Polysaccharide-23 05/08/2013  . Td 04/25/2003  . Tdap 08/18/2010  . Zoster 06/02/2008  . Zoster Recombinat (Shingrix) 02/20/2018, 06/01/2018, 06/25/2018    Qualifies for Shingles Vaccine? Completed series  Tdap: Up to date  Flu Vaccine: Up to date  Pneumococcal Vaccine: Completed series  Screening Tests Health Maintenance  Topic Date Due  . INFLUENZA VACCINE  02/23/2019  . MAMMOGRAM  03/07/2020  . TETANUS/TDAP  08/18/2020  . DEXA SCAN  02/24/2021  . COLONOSCOPY  03/02/2022  . Hepatitis C Screening  Completed  . PNA vac Low Risk Adult  Completed    Cancer Screenings:  Colorectal Screening: Completed 03/02/17. Repeat every 5 years.  Mammogram: Completed 02/25/18.   Bone Density: Completed 02/25/16. Results reflect OSTEOPENIA. Repeat every 5  years.   Lung Cancer Screening: (Low Dose CT Chest recommended if Age 62-80 years, 30 pack-year currently smoking OR have quit w/in 15years.) does not qualify.   Additional Screening:  Hepatitis C Screening: Up to date  Vision Screening: Recommended annual ophthalmology exams for early detection of glaucoma and other disorders of the eye.  Dental Screening: Recommended annual dental exams for proper oral hygiene  Community Resource Referral:  CRR required this visit?  No       Plan:  I have personally reviewed and addressed the Medicare Annual Wellness questionnaire and have noted the following in the patient's chart:  A. Medical and social history B. Use of alcohol, tobacco or illicit drugs  C. Current medications and supplements D. Functional ability and status E.  Nutritional status F.  Physical activity G. Advance directives H. List of other physicians I.  Hospitalizations, surgeries, and ER visits in previous 12 months J.  [place] such as hearing and vision if needed, cognitive and depression L. Referrals and appointments   In addition, I have reviewed and discussed with patient certain preventive protocols, quality metrics, and best practice recommendations. A written personalized care plan for preventive services as well as general preventive health recommendations were provided to patient. Nurse Health Advisor  Signed,    Larnie Heart [place], [place]  07/27/1592 Nurse Health Advisor   Nurse Notes: None.

## 2019-03-05 NOTE — Progress Notes (Signed)
Patient: Cindy Vega, Female    DOB: 06-10-1946, 73 y.o.   MRN: [id number] Visit Date: 03/06/2019  Today's Provider: Mar Daring, PA-C   Chief Complaint  Patient presents with  . Annual Exam   Subjective:     Patient had AWV with [facility] on 02/13/19.   Complete Physical2 Cindy Vega is a 73 y.o. female. She feels well. She reports exercising some. She reports she is sleeping well. ----------------------------------------------------------- Mammogram scheduled for 03/11/2019  Review of Systems  Constitutional: Negative.   HENT: Negative.   Eyes: Negative.   Respiratory: Negative.   Cardiovascular: Negative.   Gastrointestinal: Negative.   Endocrine: Negative.   Genitourinary: Negative.   Musculoskeletal: Positive for arthralgias.  Skin: Negative.   Allergic/Immunologic: Negative.   Neurological: Negative.   Hematological: Negative.   Psychiatric/Behavioral: Negative.     Social History   Socioeconomic History  . Marital status: Married    Spouse name: Not on file  . Number of children: 2  . Years of education: Not on file  . Highest education level: Bachelor's degree (e.g., BA, AB, BS)  Occupational History  . Occupation: retired  Scientific laboratory technician  . Financial resource strain: Not hard at all  . Food insecurity    Worry: Never true    Inability: Never true  . Transportation needs    Medical: No    Non-medical: No  Tobacco Use  . Smoking status: Never Smoker  . Smokeless tobacco: Never Used  Substance and Sexual Activity  . Alcohol use: Yes    Alcohol/week: 3.0 standard drinks    Types: 3 Glasses of wine per week  . Drug use: No  . Sexual activity: Not on file  Lifestyle  . Physical activity    Days per week: 0 days    Minutes per session: 0 min  . Stress: Not at all  Relationships  . Social Herbalist on phone: Patient refused    Gets together: Patient refused    Attends religious service: Patient refused   Active member of club or organization: Patient refused    Attends meetings of clubs or organizations: Patient refused    Relationship status: Patient refused  . Intimate partner violence    Fear of current or ex partner: Patient refused    Emotionally abused: Patient refused    Physically abused: Patient refused    Forced sexual activity: Patient refused  Other Topics Concern  . Not on file  Social History Narrative  . Not on file    Past Medical History:  Diagnosis Date  . Arthritis    fingers  . Cancer ([place])    basal cell  . Hypertension   . PONV (postoperative nausea and vomiting)    after hysterectomy     Patient Active Problem List   Diagnosis Date Noted  . Special screening for malignant neoplasms, colon   . Benign neoplasm of ascending colon   . Benign neoplasm of descending colon   . Anxiety 10/02/2015  . Abnormal liver enzymes 10/02/2015  . Hypercholesteremia 10/02/2015  . Blood glucose elevated 10/02/2015  . BP (high blood pressure) 10/02/2015  . Arthritis, degenerative 10/02/2015  . Apnea, sleep 10/02/2015  . Upper respiratory infection 10/02/2015  . Insomnia 09/02/2015  . Family history of breast cancer in first degree relative 01/05/2012  . Bloodgood disease 01/05/2012  . Personal history of surgery to heart and great vessels, presenting hazards to health 01/05/2012  .  H/O total hysterectomy 01/05/2012  . Glaucoma 11/22/2007  . Hay fever 06/15/2004    Past Surgical History:  Procedure Laterality Date  . ABDOMINAL HYSTERECTOMY    . BREAST CYST EXCISION Right 1996   cyst removed-neg  . CATARACT EXTRACTION    . COLONOSCOPY WITH PROPOFOL N/A 03/02/2017   Procedure: COLONOSCOPY WITH PROPOFOL;  Surgeon: Lucilla Lame, MD;  Location: [facility];  Service: Endoscopy;  Laterality: N/A;  . POLYPECTOMY N/A 03/02/2017   Procedure: POLYPECTOMY;  Surgeon: Lucilla Lame, MD;  Location: [facility];  Service: Endoscopy;  Laterality: N/A;    Her  family history includes Breast cancer in her sister; Breast cancer (age of onset: 24) in her sister; Diabetes in her father and mother; Berenice Primas' disease in her sister; Heart disease in her mother; Hypertension in her mother; Prostate cancer in her father.   Current Outpatient Medications:  .  aspirin 81 MG tablet, Take by mouth every other day. , Disp: , Rfl:  .  Biotin 5 MG CAPS, daily. , Disp: , Rfl:  .  cholecalciferol (VITAMIN D) 1000 units tablet, Take 1,000 Units by mouth daily. , Disp: , Rfl:  .  FLUoxetine (PROZAC) 10 MG capsule, TAKE 1 CAPSULE EVERY DAY, Disp: 90 capsule, Rfl: 3 .  latanoprost (XALATAN) 0.005 % ophthalmic solution, 1 drop at bedtime., Disp: , Rfl:  .  MULTIPLE VITAMIN PO, Take by mouth daily. , Disp: , Rfl:  .  Omega-3 Fatty Acids (FISH OIL) 1000 MG CAPS, daily. , Disp: , Rfl:  .  psyllium (METAMUCIL) 58.6 % packet, Take 1 packet by mouth daily., Disp: , Rfl:  .  triamterene-hydrochlorothiazide (MAXZIDE-25) 37.5-25 MG tablet, TAKE 1 TABLET EVERY DAY, Disp: 90 tablet, Rfl: 3  Patient Care Team: Mar Daring, PA-C as PCP - General (Family Medicine) Eulogio Bear, MD as Consulting Physician (Ophthalmology)     Objective:    Vitals: BP 137/83 (BP Location: Right Arm, Patient Position: Sitting, Cuff Size: Large)   Pulse 84   Temp 98 F (36.7 C) (Oral)   Resp 16   Ht 5\' 6"  (1.676 m)   Wt 168 lb (76.2 kg)   SpO2 97%   BMI 27.12 kg/m   Physical Exam Vitals signs reviewed.  Constitutional:      General: She is not in acute distress.    Appearance: Normal appearance. She is well-developed and normal weight. She is not ill-appearing or diaphoretic.  HENT:     Head: Normocephalic and atraumatic.     Right Ear: Tympanic membrane, ear canal and external ear normal.     Left Ear: Tympanic membrane, ear canal and external ear normal.     Nose: Nose normal.     Mouth/Throat:     Mouth: Mucous membranes are moist.     Pharynx: No oropharyngeal exudate.   Eyes:     General: No scleral icterus.       Right eye: No discharge.        Left eye: No discharge.     Extraocular Movements: Extraocular movements intact.     Conjunctiva/sclera: Conjunctivae normal.     Pupils: Pupils are equal, round, and reactive to light.  Neck:     Musculoskeletal: Normal range of motion and neck supple.     Thyroid: No thyromegaly.     Vascular: No carotid bruit or JVD.     Trachea: No tracheal deviation.  Cardiovascular:     Rate and Rhythm: Normal rate and regular rhythm.  Pulses: Normal pulses.     Heart sounds: Normal heart sounds. No murmur. No friction rub. No gallop.   Pulmonary:     Effort: Pulmonary effort is normal. No respiratory distress.     Breath sounds: Normal breath sounds. No wheezing or rales.  Chest:     Chest wall: No tenderness.  Abdominal:     General: Abdomen is flat. Bowel sounds are normal. There is no distension.     Palpations: Abdomen is soft. There is no mass.     Tenderness: There is no abdominal tenderness. There is no guarding or rebound.  Musculoskeletal:     Right hand: She exhibits decreased range of motion (right thumb), tenderness, bony tenderness (near 1st MCP joint palmar surface) and swelling. She exhibits normal capillary refill and no deformity. Normal sensation noted. Decreased strength noted. She exhibits thumb/finger opposition.       Hands:     Right lower leg: No edema.     Left lower leg: No edema.  Lymphadenopathy:     Cervical: No cervical adenopathy.  Skin:    General: Skin is warm and dry.     Capillary Refill: Capillary refill takes less than 2 seconds.     Findings: No rash.  Neurological:     General: No focal deficit present.     Mental Status: She is alert and oriented to person, place, and time. Mental status is at baseline.     Cranial Nerves: No cranial nerve deficit.     Motor: No weakness.     Coordination: Coordination normal.     Gait: Gait normal.  Psychiatric:        Mood and  Affect: Mood normal.        Behavior: Behavior normal.        Thought Content: Thought content normal.        Judgment: Judgment normal.     Activities of Daily Living In your present state of health, do you have any difficulty performing the following activities: 02/13/2019  Hearing? N  Vision? N  Difficulty concentrating or making decisions? N  Walking or climbing stairs? N  Dressing or bathing? N  Doing errands, shopping? N  Preparing Food and eating ? N  Using the Toilet? N  In the past six months, have you accidently leaked urine? N  Do you have problems with loss of bowel control? N  Managing your Medications? N  Managing your Finances? N  Housekeeping or managing your Housekeeping? N  Some recent data might be hidden    Fall Risk Assessment Fall Risk  02/13/2019 02/07/2018 12/05/2016 12/03/2015  Falls in the past year? 0 No No No     Depression Screen PHQ 2/9 Scores 02/13/2019 02/07/2018 02/07/2018 12/05/2016  PHQ - 2 Score 0 0 0 0  PHQ- 9 Score - 0 - 1    No flowsheet data found.     Assessment & Plan:    Annual Physical Reviewed patient's Family Medical History Reviewed and updated list of patient's medical providers Assessment of cognitive impairment was done Assessed patient's functional ability Established a written schedule for health screening [place] Completed and Reviewed  Exercise Activities and Dietary recommendations Goals    . DIET - INCREASE WATER INTAKE     Recommend increasing water intake to 6-8 glasses a day.        Immunization History  Administered Date(s) Administered  . Influenza, High Dose Seasonal PF 05/13/2014, 04/01/2016, 05/03/2017  . Influenza,inj,Quad  PF,6+ Mos 05/08/2013  . Influenza-Unspecified 05/26/2015, 04/17/2018  . Pneumococcal Conjugate-13 05/13/2014  . Pneumococcal Polysaccharide-23 05/08/2013  . Td 04/25/2003  . Tdap 08/18/2010  . Zoster 06/02/2008  . Zoster Recombinat (Shingrix)  02/20/2018, 06/01/2018, 06/25/2018    Health Maintenance  Topic Date Due  . INFLUENZA VACCINE  02/23/2019  . MAMMOGRAM  03/07/2020  . TETANUS/TDAP  08/18/2020  . DEXA SCAN  02/24/2021  . COLONOSCOPY  03/02/2022  . Hepatitis C Screening  Completed  . PNA vac Low Risk Adult  Completed     Discussed health benefits of physical activity, and encouraged her to engage in regular exercise appropriate for her age and condition.    1. Annual physical exam Normal physical exam today. Will check labs as below and f/u pending lab results. If labs are stable and WNL she will not need to have these rechecked for one year at her next annual physical exam. She is to call the office in the meantime if she has any acute issue, questions or concerns. - CBC w/Diff/Platelet - Comprehensive Metabolic Panel (CMET) - TSH - Lipid Profile - HgB A1c  2. Breast cancer screening Mammogram scheduled 03/11/19. Does have family history of breast cancer. Does not perform regular self checks.   3. Essential hypertension Stable. Continue Maxzide 37.5-25mg . Will check labs as below and f/u pending results. - CBC w/Diff/Platelet - Comprehensive Metabolic Panel (CMET) - Lipid Profile - HgB A1c  4. Blood glucose elevated Diet controlled. Will check labs as below and f/u pending results. - CBC w/Diff/Platelet - Comprehensive Metabolic Panel (CMET) - Lipid Profile - HgB A1c  5. Abnormal liver enzymes Diet controlled. Will check labs as below and f/u pending results. - CBC w/Diff/Platelet - Comprehensive Metabolic Panel (CMET) - Lipid Profile - HgB A1c  6. Hypercholesteremia Diet controlled. Will check labs as below and f/u pending results. - CBC w/Diff/Platelet - Comprehensive Metabolic Panel (CMET) - Lipid Profile - HgB A1c  7. Trigger thumb of right hand Referral placed. OA vs trigger finger vs both.  - Ambulatory referral to Orthopedic Surgery   ------------------------------------------------------------------------------------------------------------    Mar Daring, PA-C  [facility]

## 2019-03-06 ENCOUNTER — Encounter: Payer: Self-pay | Admitting: Physician Assistant

## 2019-03-06 ENCOUNTER — Ambulatory Visit (INDEPENDENT_AMBULATORY_CARE_PROVIDER_SITE_OTHER): Payer: Medicare HMO | Admitting: Physician Assistant

## 2019-03-06 ENCOUNTER — Other Ambulatory Visit: Payer: Self-pay

## 2019-03-06 VITALS — BP 137/83 | HR 84 | Temp 98.0°F | Resp 16 | Ht 66.0 in | Wt 168.0 lb

## 2019-03-06 DIAGNOSIS — I10 Essential (primary) hypertension: Secondary | ICD-10-CM

## 2019-03-06 DIAGNOSIS — Z1239 Encounter for other screening for malignant neoplasm of breast: Secondary | ICD-10-CM | POA: Diagnosis not present

## 2019-03-06 DIAGNOSIS — E7800 Pure hypercholesterolemia, unspecified: Secondary | ICD-10-CM

## 2019-03-06 DIAGNOSIS — R739 Hyperglycemia, unspecified: Secondary | ICD-10-CM

## 2019-03-06 DIAGNOSIS — M65311 Trigger thumb, right thumb: Secondary | ICD-10-CM

## 2019-03-06 DIAGNOSIS — R748 Abnormal levels of other serum enzymes: Secondary | ICD-10-CM

## 2019-03-06 DIAGNOSIS — Z0000 Encounter for general adult medical examination without abnormal findings: Secondary | ICD-10-CM | POA: Diagnosis not present

## 2019-03-06 DIAGNOSIS — G4733 Obstructive sleep apnea (adult) (pediatric): Secondary | ICD-10-CM | POA: Diagnosis not present

## 2019-03-06 NOTE — Patient Instructions (Signed)
Health Maintenance After Age 72 After age 72, you are at a higher risk for certain long-term diseases and infections as well as injuries from falls. Falls are a major cause of broken bones and head injuries in people who are older than age 72. Getting regular preventive care can help to keep you healthy and well. Preventive care includes getting regular testing and making lifestyle changes as recommended by your health care provider. Talk with your health care provider about:  Which screenings and tests you should have. A screening is a test that checks for a disease when you have no symptoms.  A diet and exercise plan that is right for you. What should I know about screenings and tests to prevent falls? Screening and testing are the best ways to find a health problem early. Early diagnosis and treatment give you the best chance of managing medical conditions that are common after age 72. Certain conditions and lifestyle choices may make you more likely to have a fall. Your health care provider may recommend:  Regular vision checks. Poor vision and conditions such as cataracts can make you more likely to have a fall. If you wear glasses, make sure to get your prescription updated if your vision changes.  Medicine review. Work with your health care provider to regularly review all of the medicines you are taking, including over-the-counter medicines. Ask your health care provider about any side effects that may make you more likely to have a fall. Tell your health care provider if any medicines that you take make you feel dizzy or sleepy.  Osteoporosis screening. Osteoporosis is a condition that causes the bones to get weaker. This can make the bones weak and cause them to break more easily.  Blood pressure screening. Blood pressure changes and medicines to control blood pressure can make you feel dizzy.  Strength and balance checks. Your health care provider may recommend certain tests to check your  strength and balance while standing, walking, or changing positions.  Foot health exam. Foot pain and numbness, as well as not wearing proper footwear, can make you more likely to have a fall.  Depression screening. You may be more likely to have a fall if you have a fear of falling, feel emotionally low, or feel unable to do activities that you used to do.  Alcohol use screening. Using too much alcohol can affect your balance and may make you more likely to have a fall. What actions can I take to lower my risk of falls? General instructions  Talk with your health care provider about your risks for falling. Tell your health care provider if: ? You fall. Be sure to tell your health care provider about all falls, even ones that seem minor. ? You feel dizzy, sleepy, or off-balance.  Take over-the-counter and prescription medicines only as told by your health care provider. These include any supplements.  Eat a healthy diet and maintain a healthy weight. A healthy diet includes low-fat dairy products, low-fat (lean) meats, and fiber from whole grains, beans, and lots of fruits and vegetables. Home safety  Remove any tripping hazards, such as rugs, cords, and clutter.  Install safety equipment such as grab bars in bathrooms and safety rails on stairs.  Keep rooms and walkways well-lit. Activity   Follow a regular exercise program to stay fit. This will help you maintain your balance. Ask your health care provider what types of exercise are appropriate for you.  If you need a cane or   walker, use it as recommended by your health care provider.  Wear supportive shoes that have nonskid soles. Lifestyle  Do not drink alcohol if your health care provider tells you not to drink.  If you drink alcohol, limit how much you have: ? 0-1 drink a day for women. ? 0-2 drinks a day for men.  Be aware of how much alcohol is in your drink. In the [place] one drink equals one typical bottle of beer (12  oz), one-half glass of wine (5 oz), or one shot of hard liquor (1 oz).  Do not use any products that contain nicotine or tobacco, such as cigarettes and e-cigarettes. If you need help quitting, ask your health care provider. Summary  Having a healthy lifestyle and getting preventive care can help to protect your health and wellness after age 56.  Screening and testing are the best way to find a health problem early and help you avoid having a fall. Early diagnosis and treatment give you the best chance for managing medical conditions that are more common for people who are older than age 67.  Falls are a major cause of broken bones and head injuries in people who are older than age 21. Take precautions to prevent a fall at home.  Work with your health care provider to learn what changes you can make to improve your health and wellness and to prevent falls. This information is not intended to replace advice given to you by your health care provider. Make sure you discuss any questions you have with your health care provider. Document Released: 05/24/2017 Document Revised: 11/01/2018 Document Reviewed: 05/24/2017 Elsevier Patient Education  [name].   Trigger Finger  Trigger finger (stenosing tenosynovitis) is a condition that causes a finger to get stuck in a bent position. Each finger has a tough, cord-like tissue that connects muscle to bone (tendon), and each tendon is surrounded by a tunnel of tissue (tendon sheath). To move your finger, your tendon needs to slide freely through the sheath. Trigger finger happens when the tendon or the sheath thickens, making it difficult to move your finger. Trigger finger can affect any finger or a thumb. It may affect more than one finger. Mild cases may clear up with rest and medicine. Severe cases require more treatment. What are the causes? Trigger finger is caused by a thickened finger tendon or tendon sheath. The cause of this thickening  is not known. What increases the risk? The following factors may make you more likely to develop this condition:  Doing activities that require a strong grip.  Having rheumatoid arthritis, gout, or diabetes.  Being 20-15 years old.  Being a woman. What are the signs or symptoms? Symptoms of this condition include:  Pain when bending or straightening your finger.  Tenderness or swelling where your finger attaches to the palm of your hand.  A lump in the palm of your hand or on the inside of your finger.  Hearing a popping sound when you try to straighten your finger.  Feeling a popping, catching, or locking sensation when you try to straighten your finger.  Being unable to straighten your finger. How is this diagnosed? This condition is diagnosed based on your symptoms and a physical exam. How is this treated? This condition may be treated by:  Resting your finger and avoiding activities that make symptoms worse.  Wearing a finger splint to keep your finger in a slightly bent position.  Taking NSAIDs to relieve pain  and swelling.  Injecting medicine (steroids) into the tendon sheath to reduce swelling and irritation. Injections may need to be repeated.  Having surgery to open the tendon sheath. This may be done if other treatments do not work and you cannot straighten your finger. You may need physical therapy after surgery. Follow these instructions at home:   Use moist heat to help reduce pain and swelling as told by your health care provider.  Rest your finger and avoid activities that make pain worse. Return to normal activities as told by your health care provider.  If you have a splint, wear it as told by your health care provider.  Take over-the-counter and prescription medicines only as told by your health care provider.  Keep all follow-up visits as told by your health care provider. This is important. Contact a health care provider if:  Your symptoms are  not improving with home care. Summary  Trigger finger (stenosing tenosynovitis) causes your finger to get stuck in a bent position, and it can make it difficult and painful to straighten your finger.  This condition develops when a finger tendon or tendon sheath thickens.  Treatment starts with resting, wearing a splint, and taking NSAIDs.  In severe cases, surgery to open the tendon sheath may be needed. This information is not intended to replace advice given to you by your health care provider. Make sure you discuss any questions you have with your health care provider. Document Released: 04/30/2004 Document Revised: 06/23/2017 Document Reviewed: 06/21/2016 Elsevier Patient Education  2020 [employer].

## 2019-03-07 ENCOUNTER — Telehealth: Payer: Self-pay

## 2019-03-07 LAB — COMPREHENSIVE METABOLIC PANEL
ALT: 26 IU/L (ref 0–32)
AST: 22 IU/L (ref 0–40)
Albumin/Globulin Ratio: 2 (ref 1.2–2.2)
Albumin: 4.4 g/dL (ref 3.7–4.7)
Alkaline Phosphatase: 47 IU/L (ref 39–117)
BUN/Creatinine Ratio: 17 (ref 12–28)
BUN: 17 mg/dL (ref 8–27)
Bilirubin Total: 0.3 mg/dL (ref 0.0–1.2)
CO2: 22 mmol/L (ref 20–29)
Calcium: 9.7 mg/dL (ref 8.7–10.3)
Chloride: 100 mmol/L (ref 96–106)
Creatinine, Ser: 1.03 mg/dL — ABNORMAL HIGH (ref 0.57–1.00)
GFR calc Af Amer: 63 mL/min/{1.73_m2} (ref >59)
GFR calc non Af Amer: 55 mL/min/{1.73_m2} — ABNORMAL LOW (ref >59)
Globulin, Total: 2.2 g/dL (ref 1.5–4.5)
Glucose: 100 mg/dL — ABNORMAL HIGH (ref 65–99)
Potassium: 4 mmol/L (ref 3.5–5.2)
Sodium: 137 mmol/L (ref 134–144)
Total Protein: 6.6 g/dL (ref 6.0–8.5)

## 2019-03-07 LAB — CBC WITH DIFFERENTIAL/PLATELET
Basophils Absolute: 0 10*3/uL (ref 0.0–0.2)
Basos: 1 %
EOS (ABSOLUTE): 0.1 10*3/uL (ref 0.0–0.4)
Eos: 3 %
Hematocrit: 37.5 % (ref 34.0–46.6)
Hemoglobin: 13.1 g/dL (ref 11.1–15.9)
Immature Grans (Abs): 0 10*3/uL (ref 0.0–0.1)
Immature Granulocytes: 0 %
Lymphocytes Absolute: 1.9 10*3/uL (ref 0.7–3.1)
Lymphs: 33 %
MCH: 32.7 pg (ref 26.6–33.0)
MCHC: 34.9 g/dL (ref 31.5–35.7)
MCV: 94 fL (ref 79–97)
Monocytes Absolute: 0.7 10*3/uL (ref 0.1–0.9)
Monocytes: 12 %
Neutrophils Absolute: 2.9 10*3/uL (ref 1.4–7.0)
Neutrophils: 51 %
Platelets: 273 10*3/uL (ref 150–450)
RBC: 4.01 x10E6/uL (ref 3.77–5.28)
RDW: 13 % (ref 11.7–15.4)
WBC: 5.7 10*3/uL (ref 3.4–10.8)

## 2019-03-07 LAB — LIPID PANEL
Chol/HDL Ratio: 3.6 ratio (ref 0.0–4.4)
Cholesterol, Total: 234 mg/dL — ABNORMAL HIGH (ref 100–199)
HDL: 65 mg/dL (ref >39)
LDL Calculated: 121 mg/dL — ABNORMAL HIGH (ref 0–99)
Triglycerides: 238 mg/dL — ABNORMAL HIGH (ref 0–149)
VLDL Cholesterol Cal: 48 mg/dL — ABNORMAL HIGH (ref 5–40)

## 2019-03-07 LAB — TSH: TSH: 2.25 u[IU]/mL (ref 0.450–4.500)

## 2019-03-07 LAB — HEMOGLOBIN A1C
Est. average glucose Bld gHb Est-mCnc: 114 mg/dL
Hgb A1c MFr Bld: 5.6 % (ref 4.8–5.6)

## 2019-03-07 NOTE — Telephone Encounter (Signed)
Patient advised as directed below. 

## 2019-03-07 NOTE — Telephone Encounter (Signed)
-----   Message from Mar Daring, [place] sent at 03/07/2019  8:47 AM EDT ----- Blood count is normal. Kidney and liver enzymes are normal and stable. Sugar is normal. Thyroid is normal. Cholesterol is still borderline high but lower than last year. A1c down to normal at 5.6 from 5.8 last year.

## 2019-03-11 ENCOUNTER — Telehealth: Payer: Self-pay

## 2019-03-11 ENCOUNTER — Ambulatory Visit
Admission: RE | Admit: 2019-03-11 | Discharge: 2019-03-11 | Disposition: A | Discharge status: Home / Self Care | Payer: Medicare HMO | Source: Ambulatory Visit | Attending: Physician Assistant | Admitting: Physician Assistant

## 2019-03-11 ENCOUNTER — Other Ambulatory Visit: Payer: Self-pay

## 2019-03-11 DIAGNOSIS — Z1231 Encounter for screening mammogram for malignant neoplasm of breast: Secondary | ICD-10-CM

## 2019-03-11 NOTE — Telephone Encounter (Signed)
-----   Message from Mar Daring, [place] sent at 03/11/2019 11:02 AM EDT ----- Normal mammogram. Repeat screening in one year.

## 2019-03-11 NOTE — Telephone Encounter (Signed)
Patient advised as directed below. 

## 2019-03-19 DIAGNOSIS — M65311 Trigger thumb, right thumb: Secondary | ICD-10-CM | POA: Diagnosis not present

## 2019-04-02 DIAGNOSIS — G4733 Obstructive sleep apnea (adult) (pediatric): Secondary | ICD-10-CM | POA: Diagnosis not present

## 2019-04-06 DIAGNOSIS — G4733 Obstructive sleep apnea (adult) (pediatric): Secondary | ICD-10-CM | POA: Diagnosis not present

## 2019-04-12 DIAGNOSIS — Z85828 Personal history of other malignant neoplasm of skin: Secondary | ICD-10-CM | POA: Diagnosis not present

## 2019-04-12 DIAGNOSIS — Z08 Encounter for follow-up examination after completed treatment for malignant neoplasm: Secondary | ICD-10-CM | POA: Diagnosis not present

## 2019-04-12 DIAGNOSIS — L821 Other seborrheic keratosis: Secondary | ICD-10-CM | POA: Diagnosis not present

## 2019-04-12 DIAGNOSIS — L608 Other nail disorders: Secondary | ICD-10-CM | POA: Diagnosis not present

## 2019-05-06 DIAGNOSIS — G4733 Obstructive sleep apnea (adult) (pediatric): Secondary | ICD-10-CM | POA: Diagnosis not present

## 2019-06-06 DIAGNOSIS — G4733 Obstructive sleep apnea (adult) (pediatric): Secondary | ICD-10-CM | POA: Diagnosis not present

## 2019-07-23 DIAGNOSIS — M65311 Trigger thumb, right thumb: Secondary | ICD-10-CM | POA: Diagnosis not present

## 2019-08-07 ENCOUNTER — Other Ambulatory Visit: Payer: Self-pay | Admitting: Physician Assistant

## 2019-08-07 DIAGNOSIS — F320 Major depressive disorder, single episode, mild: Secondary | ICD-10-CM

## 2019-08-30 ENCOUNTER — Ambulatory Visit: Payer: Medicare HMO | Attending: Internal Medicine

## 2019-08-30 DIAGNOSIS — Z23 Encounter for immunization: Secondary | ICD-10-CM | POA: Insufficient documentation

## 2019-08-30 NOTE — Progress Notes (Signed)
   Covid-19 Vaccination Clinic  Name:  Cindy Vega    MRN: [id number] DOB: Oct 17, 1945  08/30/2019  [name] was observed post Covid-19 immunization for 15 minutes without incidence. She was provided with Vaccine Information Sheet and instruction to access the V-Safe system.   [name] was instructed to call 911 with any severe reactions post vaccine: Marland Kitchen Difficulty breathing  . Swelling of your face and throat  . A fast heartbeat  . A bad rash all over your body  . Dizziness and weakness    Immunizations Administered    Name Date Dose VIS Date Route   Moderna COVID-19 Vaccine 08/30/2019  1:51 PM 0.5 mL 06/25/2019 Intramuscular   Manufacturer: Moderna   Lot: [id number]   [id number]

## 2019-09-14 ENCOUNTER — Other Ambulatory Visit: Payer: Self-pay | Admitting: Physician Assistant

## 2019-09-14 DIAGNOSIS — I10 Essential (primary) hypertension: Secondary | ICD-10-CM

## 2019-09-15 NOTE — Telephone Encounter (Signed)
30 day courtesy refill. Has scheduled appt but not until 02/17/2020

## 2019-09-30 DIAGNOSIS — G4733 Obstructive sleep apnea (adult) (pediatric): Secondary | ICD-10-CM | POA: Diagnosis not present

## 2019-10-01 ENCOUNTER — Ambulatory Visit: Payer: Medicare HMO | Attending: Internal Medicine

## 2019-10-01 DIAGNOSIS — G4733 Obstructive sleep apnea (adult) (pediatric): Secondary | ICD-10-CM | POA: Diagnosis not present

## 2019-10-01 DIAGNOSIS — Z23 Encounter for immunization: Secondary | ICD-10-CM

## 2019-10-01 NOTE — Progress Notes (Signed)
   Covid-19 Vaccination Clinic  Name:  ROMESHA KUMP    MRN: [id number] DOB: Dec 11, 1945  10/01/2019  Ms. Bainum was observed post Covid-19 immunization for 15 minutes without incident. She was provided with Vaccine Information Sheet and instruction to access the V-Safe system.   Ms. Santell was instructed to call 911 with any severe reactions post vaccine: Marland Kitchen Difficulty breathing  . Swelling of face and throat  . A fast heartbeat  . A bad rash all over body  . Dizziness and weakness   Immunizations Administered    Name Date Dose VIS Date Route   Moderna COVID-19 Vaccine 10/01/2019  1:17 PM 0.5 mL 06/25/2019 Intramuscular   Manufacturer: Moderna   Lot: [id number]   [id number]

## 2019-10-08 ENCOUNTER — Other Ambulatory Visit: Payer: Self-pay | Admitting: Physician Assistant

## 2019-10-08 DIAGNOSIS — I10 Essential (primary) hypertension: Secondary | ICD-10-CM

## 2019-10-08 NOTE — Telephone Encounter (Signed)
Requested medications are due for refill today? Yes  Requested medications are on active medication list?  Yes  Last Refill:   09/15/2019 # 30 with no refills. Courtesy refill.    Future visit scheduled?  No  Notes to Clinic:  Per RX refill protocol patient is overdue for office visit.

## 2019-10-23 ENCOUNTER — Encounter: Payer: Self-pay | Admitting: Physician Assistant

## 2019-10-31 ENCOUNTER — Other Ambulatory Visit: Payer: Self-pay | Admitting: Physician Assistant

## 2019-10-31 DIAGNOSIS — I10 Essential (primary) hypertension: Secondary | ICD-10-CM

## 2019-11-12 IMAGING — MG DIGITAL SCREENING BILATERAL MAMMOGRAM WITH TOMO AND CAD
8 series · 8 of 24 positions shown · non-contrast
Comparison: Previous exam(s).

CLINICAL DATA: Screening.

EXAM:
DIGITAL SCREENING BILATERAL MAMMOGRAM WITH TOMO AND CAD

[R CC synth-2D]
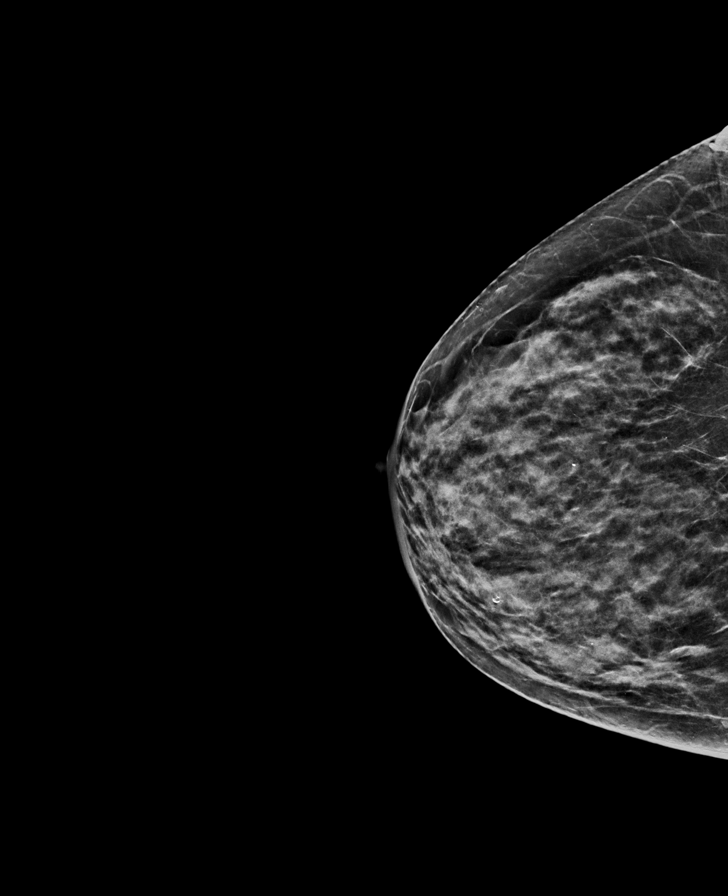

[R MLO synth-2D]
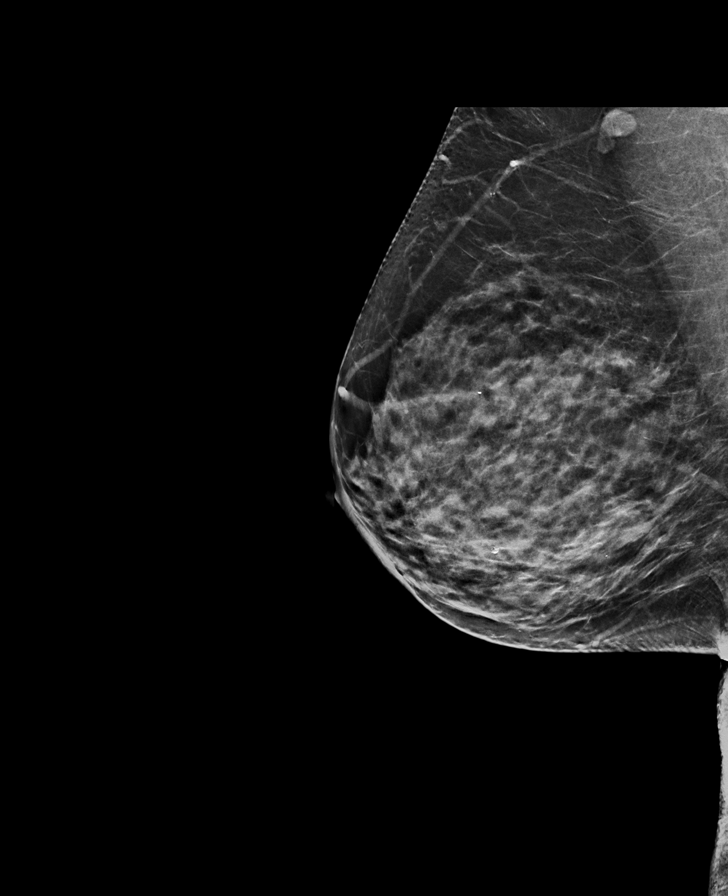

[L MLO synth-2D]
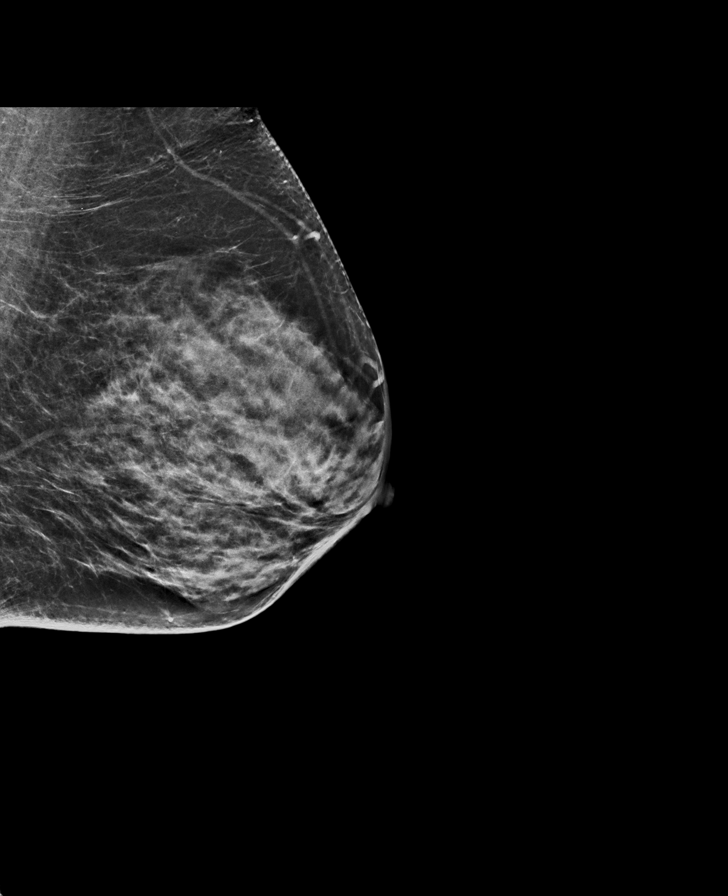

[L CC synth-2D]
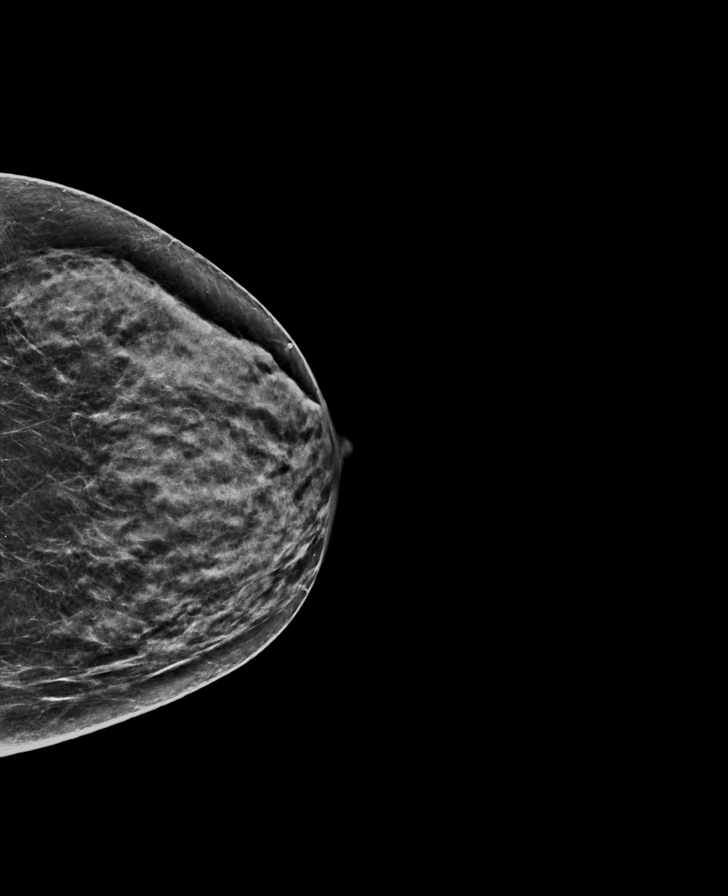

[R MLO tomo · tomo slice 34/67.0]
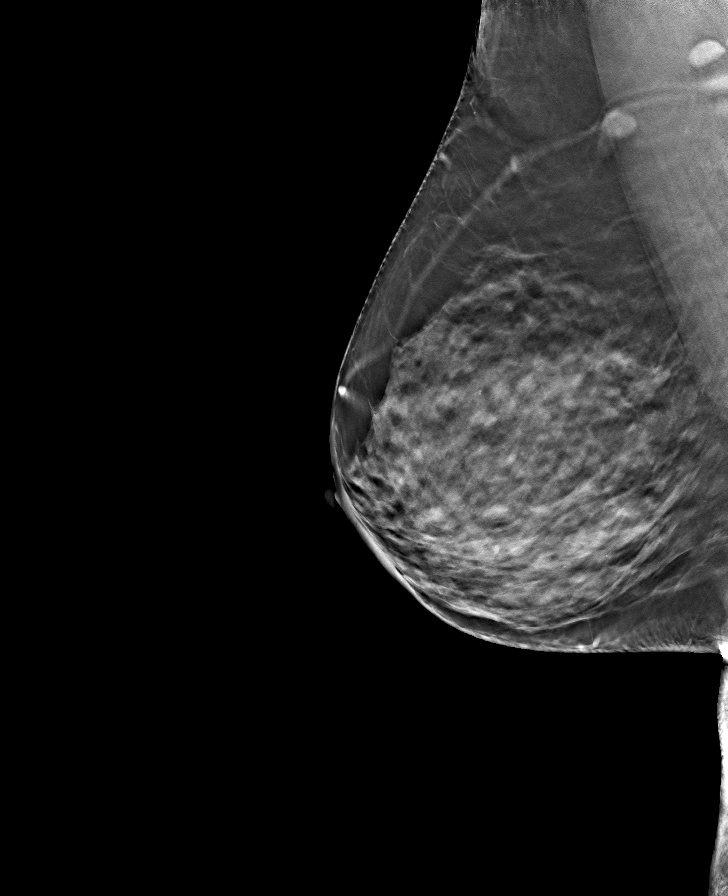

[R CC tomo · tomo slice 30/59.0]
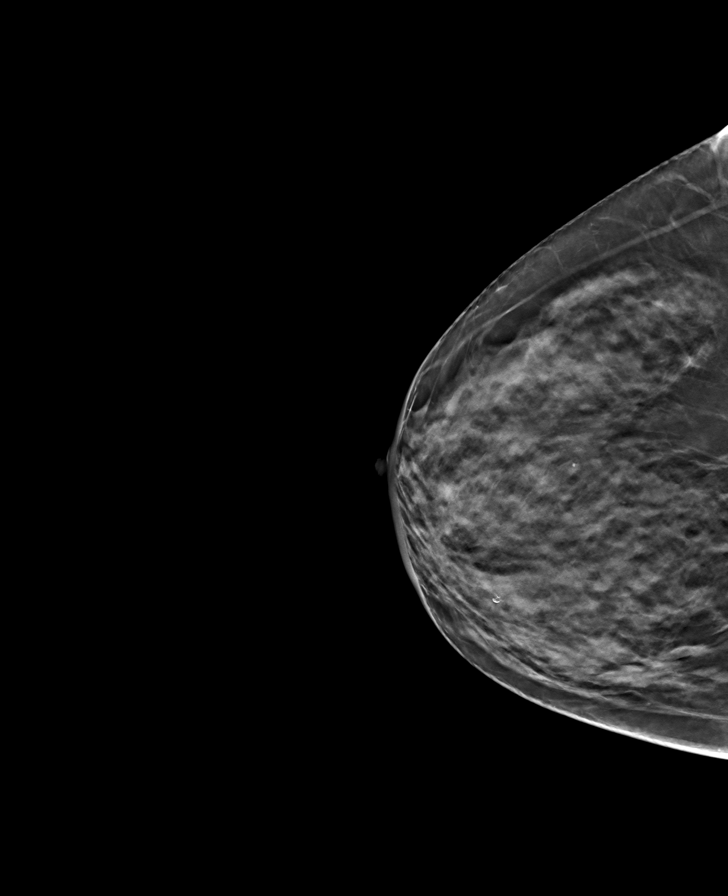

[L CC tomo · tomo slice 30/59.0]
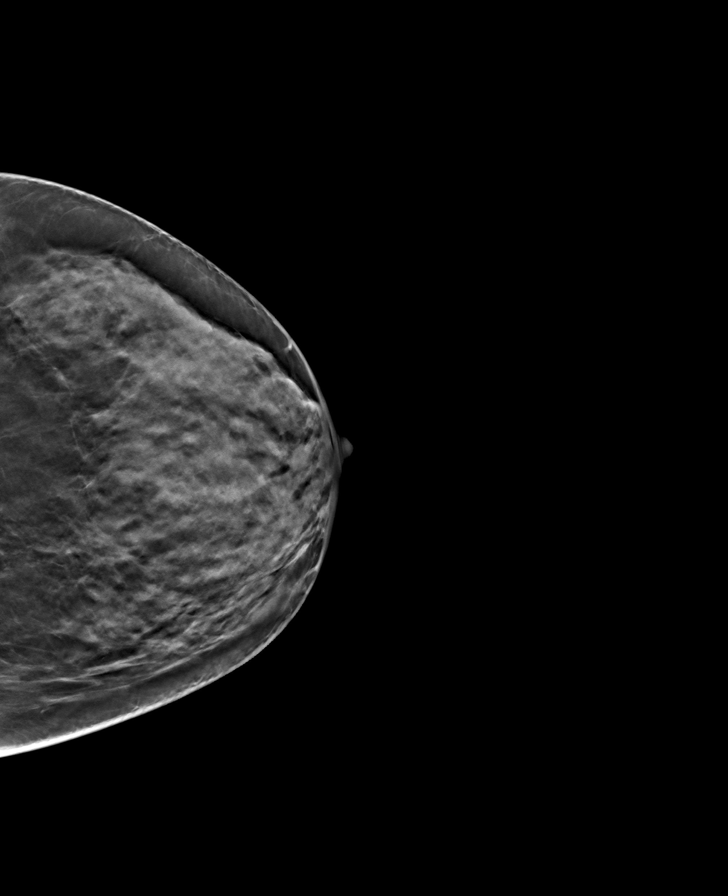

[L MLO tomo · tomo slice 33/64.0]
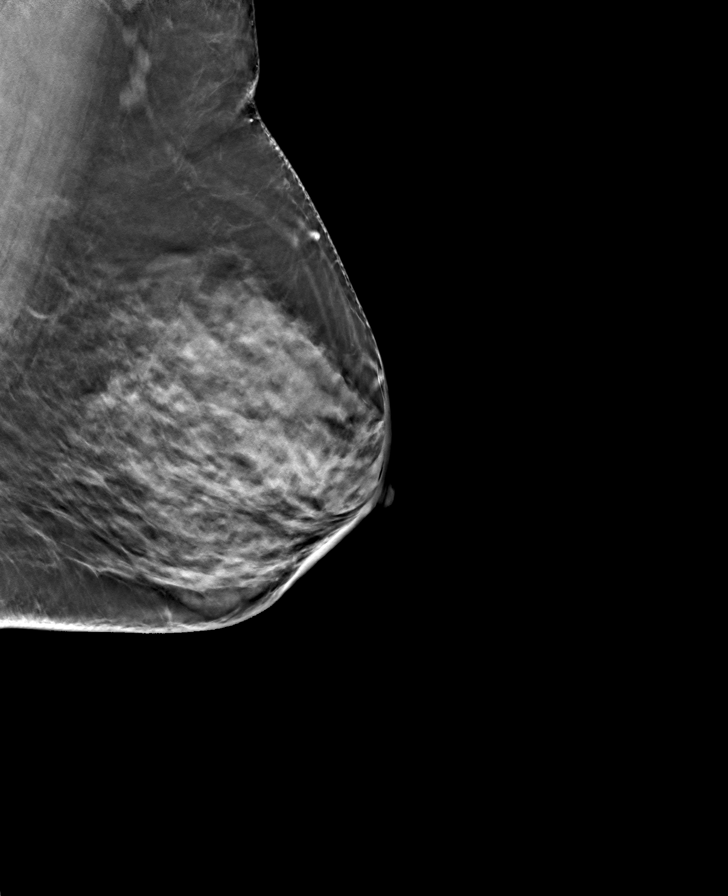

[8 of 24 positions shown; findings below may reference images not displayed]

ACR Breast Density Category c: The breast tissue is heterogeneously
dense, which may obscure small masses.
FINDINGS: There are no findings suspicious for malignancy. Images were
processed with CAD.
IMPRESSION: No mammographic evidence of malignancy. A result letter of this
screening mammogram will be mailed directly to the patient.

RECOMMENDATION:
Screening mammogram in one year. (Code:[id number])

BI-RADS CATEGORY  1: Negative.

## 2019-11-23 ENCOUNTER — Other Ambulatory Visit: Payer: Self-pay | Admitting: Physician Assistant

## 2019-11-23 DIAGNOSIS — I10 Essential (primary) hypertension: Secondary | ICD-10-CM

## 2019-11-23 NOTE — Telephone Encounter (Signed)
Requested  medications are  due for refill today  Not quite  Requested medications are on the active medication list yes  Last refill 4/12  Future visit scheduled yes but not until July  Last visit 8 months ago  Notes to clinic Looks like she had curtesy refill last time and next appt is still 2 months away, my protocol is failed due to no visit within 6 months.

## 2019-12-06 ENCOUNTER — Telehealth: Payer: Self-pay | Admitting: Physician Assistant

## 2019-12-06 DIAGNOSIS — I10 Essential (primary) hypertension: Secondary | ICD-10-CM

## 2019-12-06 MED ORDER — TRIAMTERENE-HCTZ 37.5-25 MG PO TABS: 1.0000 | ORAL_TABLET | Freq: Every day | ORAL | 1 refills | Status: DC

## 2019-12-06 NOTE — Telephone Encounter (Signed)
[facility] faxed refill request for the following medications:  triamterene-hydrochlorothiazide (MAXZIDE-25) 37.5-25 MG tablet   Please advise.  Thanks, [employer]

## 2019-12-06 NOTE — Telephone Encounter (Signed)
refilled 

## 2019-12-06 NOTE — Telephone Encounter (Signed)
It looks like a prescription was sent in on 11/23/2019

## 2019-12-17 ENCOUNTER — Other Ambulatory Visit: Payer: Self-pay | Admitting: Physician Assistant

## 2019-12-17 DIAGNOSIS — I10 Essential (primary) hypertension: Secondary | ICD-10-CM

## 2019-12-20 ENCOUNTER — Other Ambulatory Visit: Payer: Self-pay | Admitting: Physician Assistant

## 2019-12-20 DIAGNOSIS — I10 Essential (primary) hypertension: Secondary | ICD-10-CM

## 2019-12-20 MED ORDER — TRIAMTERENE-HCTZ 37.5-25 MG PO TABS: 1.0000 | ORAL_TABLET | Freq: Every day | ORAL | 1 refills | Status: DC

## 2019-12-20 NOTE — Telephone Encounter (Signed)
[facility] faxed refill request for the following medications:  triamterene-hydrochlorothiazide (MAXZIDE-25) 37.5-25 MG tablet   Please advise.  Thanks, [employer]

## 2020-01-01 DIAGNOSIS — G4733 Obstructive sleep apnea (adult) (pediatric): Secondary | ICD-10-CM | POA: Diagnosis not present

## 2020-01-19 ENCOUNTER — Other Ambulatory Visit: Payer: Self-pay

## 2020-01-19 ENCOUNTER — Ambulatory Visit (INDEPENDENT_AMBULATORY_CARE_PROVIDER_SITE_OTHER): Payer: Medicare HMO

## 2020-01-19 ENCOUNTER — Ambulatory Visit
Admission: EM | Admit: 2020-01-19 | Discharge: 2020-01-19 | Disposition: A | Discharge status: Home / Self Care | Payer: Medicare HMO | Attending: Emergency Medicine | Admitting: Emergency Medicine

## 2020-01-19 ENCOUNTER — Encounter: Payer: Self-pay | Admitting: Emergency Medicine

## 2020-01-19 DIAGNOSIS — S61239A Puncture wound without foreign body of unspecified finger without damage to nail, initial encounter: Secondary | ICD-10-CM | POA: Diagnosis not present

## 2020-01-19 DIAGNOSIS — S6991XA Unspecified injury of right wrist, hand and finger(s), initial encounter: Secondary | ICD-10-CM | POA: Diagnosis not present

## 2020-01-19 DIAGNOSIS — S61432A Puncture wound without foreign body of left hand, initial encounter: Secondary | ICD-10-CM | POA: Diagnosis not present

## 2020-01-19 DIAGNOSIS — S61236A Puncture wound without foreign body of right little finger without damage to nail, initial encounter: Secondary | ICD-10-CM

## 2020-01-19 NOTE — ED Triage Notes (Signed)
Patient states that 2 weeks ago her coffee mug broke in her right hand.  Patient states that she can still a piece of ceramic in her right hand.

## 2020-01-19 NOTE — Discharge Instructions (Signed)
You were seen for a possible foreign body in your hand.  We did an x-ray to check for any residual ceramic in your hand.  It was confirmed by radiologist that no radiopaque foreign bodies are seen in your x-ray.  The site also does not look infected. Continue to monitor the pain to the site. Follow-up with your primary care provider if the pain does not resolve in about a week.  Take care, Dr. Marland Kitchen, NP-c

## 2020-01-19 NOTE — ED Provider Notes (Signed)
[facility] - [place], [place]   Name: Cindy Vega DOB: 1945/12/25 MRN: [id number] CSN: [id number] PCP: Mar Daring, PA-C  Arrival date and time:  01/19/20 1426  Chief Complaint:  Hand Pain (right)   NOTE: Prior to seeing the patient today, I have reviewed the triage nursing documentation and vital signs. Clinical staff has updated patient's PMH/PSHx, current medication list, and drug allergies/intolerances to ensure comprehensive history available to assist in medical decision making.   History:   HPI: Cindy Vega is a 74 y.o. female who presents today with complaints of pain to her right hand.  Patient states she broke a coffee mug in her hand about 2 weeks ago. She was able to clean the area and stop the bleeding without any additional interventions. [place] the beach [place] and she started to feel pain in the area, as if there was still a piece of ceramic in her pain. She states the pain comes and goes, but she believes there still might be ceramic in her hand. Her sister-in-law is a Marine scientist and attempted to remove the ceramic, but nothing was removed.   She denies any continued pain to the area or warmth. No radiating pain throughout her hand.   Past Medical History:  Diagnosis Date   Arthritis    fingers   Cancer ([place])    basal cell   Hypertension    PONV (postoperative nausea and vomiting)    after hysterectomy    Past Surgical History:  Procedure Laterality Date   ABDOMINAL HYSTERECTOMY     BREAST CYST EXCISION Right 1996   cyst removed-neg   CATARACT EXTRACTION     COLONOSCOPY WITH PROPOFOL N/A 03/02/2017   Procedure: COLONOSCOPY WITH PROPOFOL;  Surgeon: Lucilla Lame, MD;  Location: [facility];  Service: Endoscopy;  Laterality: N/A;   POLYPECTOMY N/A 03/02/2017   Procedure: POLYPECTOMY;  Surgeon: Lucilla Lame, MD;  Location: [facility];  Service: Endoscopy;  Laterality: N/A;    Family History  Problem  Relation Age of Onset   Breast cancer Sister 5   Hypertension Mother    Heart disease Mother    Diabetes Mother    Diabetes Father    Prostate cancer Father    Graves' disease Sister    Breast cancer Sister     Social History   Tobacco Use   Smoking status: Never Smoker   Smokeless tobacco: Never Used  Vaping Use   Vaping Use: Never used  Substance Use Topics   Alcohol use: Yes    Alcohol/week: 3.0 standard drinks    Types: 3 Glasses of wine per week   Drug use: No    Patient Active Problem List   Diagnosis Date Noted   Special screening for malignant neoplasms, colon    Benign neoplasm of ascending colon    Benign neoplasm of descending colon    Anxiety 10/02/2015   Abnormal liver enzymes 10/02/2015   Hypercholesteremia 10/02/2015   Blood glucose elevated 10/02/2015   BP (high blood pressure) 10/02/2015   Arthritis, degenerative 10/02/2015   Apnea, sleep 10/02/2015   Insomnia 09/02/2015   Family history of breast cancer in first degree relative 01/05/2012   Bloodgood disease 01/05/2012   Personal history of surgery to heart and great vessels, presenting hazards to health 01/05/2012   H/O total hysterectomy 01/05/2012   Glaucoma 11/22/2007   Hay fever 06/15/2004    Home Medications:    Current Meds  Medication Sig  aspirin 81 MG tablet Take by mouth every other day.    Biotin 5 MG CAPS daily.    cholecalciferol (VITAMIN D) 1000 units tablet Take 1,000 Units by mouth daily.    FLUoxetine (PROZAC) 10 MG capsule TAKE 1 CAPSULE EVERY DAY   latanoprost (XALATAN) 0.005 % ophthalmic solution 1 drop at bedtime.   MULTIPLE VITAMIN PO Take by mouth daily.    Omega-3 Fatty Acids (FISH OIL) 1000 MG CAPS daily.    triamterene-hydrochlorothiazide (MAXZIDE-25) 37.5-25 MG tablet Take 1 tablet by mouth daily.    Allergies:   Silver nitrate  Review of Systems (ROS): Review of Systems  Musculoskeletal: Positive for myalgias.  Negative for arthralgias.  Skin: Positive for wound.  All other systems reviewed and are negative.    Vital Signs: Today's Vitals   01/19/20 1508 01/19/20 1511 01/19/20 1544  BP:  (!) 163/77   Pulse:  80   Resp:  14   Temp:  98.5 F (36.9 C)   TempSrc:  Oral   SpO2:  98%   Weight: 165 lb (74.8 kg)    Height: 5\' 6"  (1.676 m)    PainSc: 0-No pain  0-No pain    Physical Exam: Physical Exam Vitals and nursing note reviewed.  Constitutional:      Appearance: Normal appearance.  Cardiovascular:     Rate and Rhythm: Normal rate and regular rhythm.     Pulses: Normal pulses.     Heart sounds: Normal heart sounds.  Pulmonary:     Effort: Pulmonary effort is normal.     Breath sounds: Normal breath sounds.  Musculoskeletal:     Right hand: No tenderness.     Comments: Small puncture noted along fifth metacarpal.  No obvious signs of infection or foreign body palpated.  Skin:    General: Skin is warm and dry.  Neurological:     General: No focal deficit present.     Mental Status: She is alert and oriented to person, place, and time.  Psychiatric:        Mood and Affect: Mood normal.        Behavior: Behavior normal.      Urgent Care Treatments / Results:   LABS: PLEASE NOTE: all labs that were ordered this encounter are listed, however only abnormal results are displayed. Labs Reviewed - No data to display  EKG: -None  RADIOLOGY: DG Hand Complete Right  Result Date: 01/19/2020 CLINICAL DATA:  Possible ceramic foreign body at fifth metacarpal region, broke a coffee mug in her RIGHT hand 2 weeks ago EXAM: RIGHT HAND - COMPLETE 3+ VIEW COMPARISON:  None FINDINGS: Osseous demineralization. Joint space narrowing and irregularity at DIP joint index finger. Remaining joint spaces fairly well preserved. No acute fracture, dislocation, or bone destruction. No radiopaque foreign bodies identified at the palm of the RIGHT hand at the region of the fifth metacarpal. IMPRESSION:  No definite radiopaque foreign bodies identified. Electronically Signed   By: Lavonia Dana M.D.   On: 01/19/2020 15:35    PROCEDURES: Procedures  MEDICATIONS RECEIVED THIS VISIT: Medications - No data to display  PERTINENT CLINICAL COURSE NOTES/UPDATES:   Initial Impression / Assessment and Plan / Urgent Care Course:  Pertinent labs & imaging results that were available during my care of the patient were personally reviewed by me and considered in my medical decision making (see lab/imaging section of note for values and interpretations).  Cindy Vega is a 74 y.o. female who presents to [facility] today with complaints of possible foreign body, diagnosed with hand pain, and treated as such with the directions below. NP and patient reviewed discharge instructions below during visit.   Patient is well appearing overall in clinic today. She does not appear to be in any acute distress. Presenting symptoms (see HPI) and exam as documented above.   I have reviewed the follow up and strict return precautions for any new or worsening symptoms. Patient is aware of symptoms that would be deemed urgent/emergent, and would thus require further evaluation either here or in the emergency department. At the time of discharge, she verbalized understanding and consent with the discharge plan as it was reviewed with her. All questions were fielded by provider and/or clinic staff prior to patient discharge.    Final Clinical Impressions / Urgent Care Diagnoses:   Final diagnoses:  Puncture wound of left hand without foreign body, initial encounter    New Prescriptions:  [place] Controlled Substance Registry consulted? Not Applicable  No orders of the defined types were placed in this encounter.     Discharge Instructions     You were seen for a possible foreign body in your hand.  We did an x-ray to check for any residual ceramic in your hand.  It was confirmed by radiologist that no  radiopaque foreign bodies are seen in your x-ray.  The site also does not look infected. Continue to monitor the pain to the site. Follow-up with your primary care provider if the pain does not resolve in about a week.  Take care, Dr. Marland Kitchen, NP-c     Recommended Follow up Care:  Patient encouraged to follow up with the following provider within the specified time frame, or sooner as dictated by the severity of her symptoms. As always, she was instructed that for any urgent/emergent care needs, she should seek care either here or in the emergency department for more immediate evaluation.   Gertie Baron, DNP, NP-c    Gertie Baron, NP 01/19/20 [phone]

## 2020-02-07 ENCOUNTER — Other Ambulatory Visit: Payer: Self-pay | Admitting: Physician Assistant

## 2020-02-07 DIAGNOSIS — Z1231 Encounter for screening mammogram for malignant neoplasm of breast: Secondary | ICD-10-CM

## 2020-02-13 NOTE — Progress Notes (Signed)
Subjective:   Cindy Vega is a 74 y.o. female who presents for Medicare Annual (Subsequent) preventive examination.  I connected with Cindy Vega today by telephone and verified that I am speaking with the correct person using two identifiers. Location patient: home Location provider: work Persons participating in the virtual visit: patient, provider.   I discussed the limitations, risks, security and privacy concerns of performing an evaluation and management service by telephone and the availability of in person appointments. I also discussed with the patient that there may be a patient responsible charge related to this service. The patient expressed understanding and verbally consented to this telephonic visit.    Interactive audio and video telecommunications were attempted between this provider and patient, however failed, due to patient having technical difficulties OR patient did not have access to video capability.  We continued and completed visit with audio only.   Review of Systems    N/A  Cardiac Risk Factors include: advanced age (>73men, >78 women);hypertension     Objective:    There were no vitals filed for this visit. There is no height or weight on file to calculate BMI.  Advanced Directives 02/17/2020 01/19/2020 02/13/2019 02/07/2018 01/28/2018 03/02/2017 12/05/2016  Does Patient Have a Medical Advance Directive? Yes No Yes Yes Yes Yes No  Type of Paramedic of [place];Living will - Healthcare Power of [place] - -  Does patient want to make changes to medical advance directive? - - - - - Yes (MAU/Ambulatory/Procedural Areas - Information given) -  Copy of [place] in Chart? No - copy requested - No - copy requested No - copy requested - - -    Current Medications (verified) Outpatient Encounter Medications as of 02/17/2020  Medication Sig  . aspirin 81 MG  tablet Take by mouth every other day.   . Biotin 5 MG CAPS daily.   . cholecalciferol (VITAMIN D) 1000 units tablet Take 1,000 Units by mouth daily.   Marland Kitchen FLUoxetine (PROZAC) 10 MG capsule TAKE 1 CAPSULE EVERY DAY  . latanoprost (XALATAN) 0.005 % ophthalmic solution 1 drop at bedtime.  . MULTIPLE VITAMIN PO Take by mouth daily.   . Omega-3 Fatty Acids (FISH OIL) 1000 MG CAPS daily.   . psyllium (METAMUCIL) 58.6 % packet Take 1 packet by mouth daily.  Marland Kitchen triamterene-hydrochlorothiazide (MAXZIDE-25) 37.5-25 MG tablet Take 1 tablet by mouth daily.   No facility-administered encounter medications on file as of 02/17/2020.    Allergies (verified) Silver nitrate   History: Past Medical History:  Diagnosis Date  . Arthritis    fingers  . Cancer ([place])    basal cell  . Hypertension   . PONV (postoperative nausea and vomiting)    after hysterectomy   Past Surgical History:  Procedure Laterality Date  . ABDOMINAL HYSTERECTOMY    . BREAST CYST EXCISION Right 1996   cyst removed-neg  . CATARACT EXTRACTION    . COLONOSCOPY WITH PROPOFOL N/A 03/02/2017   Procedure: COLONOSCOPY WITH PROPOFOL;  Surgeon: Lucilla Lame, MD;  Location: [facility];  Service: Endoscopy;  Laterality: N/A;  . POLYPECTOMY N/A 03/02/2017   Procedure: POLYPECTOMY;  Surgeon: Lucilla Lame, MD;  Location: [facility];  Service: Endoscopy;  Laterality: N/A;   Family History  Problem Relation Age of Onset  . Breast cancer Sister 19  . Hypertension Mother   . Heart disease Mother   . Diabetes Mother   .  Diabetes Father   . Prostate cancer Father   . Graves' disease Sister   . Breast cancer Sister    Social History   Socioeconomic History  . Marital status: Married    Spouse name: Not on file  . Number of children: 2  . Years of education: Not on file  . Highest education level: Bachelor's degree (e.g., BA, AB, BS)  Occupational History  . Occupation: retired  Tobacco Use  . Smoking status: Never  Smoker  . Smokeless tobacco: Never Used  Vaping Use  . Vaping Use: Never used  Substance and Sexual Activity  . Alcohol use: Yes    Alcohol/week: 3.0 standard drinks    Types: 3 Glasses of wine per week  . Drug use: No  . Sexual activity: Not on file  Other Topics Concern  . Not on file  Social History Narrative  . Not on file   Social Determinants of Health   Financial Resource Strain: Low Risk   . Difficulty of Paying Living Expenses: Not hard at all  Food Insecurity: No Food Insecurity  . Worried About Charity fundraiser in the Last Year: Never true  . Ran Out of Food in the Last Year: Never true  Transportation Needs: No Transportation Needs  . Lack of Transportation (Medical): No  . Lack of Transportation (Non-Medical): No  Physical Activity: Inactive  . Days of Exercise per Week: 0 days  . Minutes of Exercise per Session: 0 min  Stress: Stress Concern Present  . Feeling of Stress : To some extent  Social Connections: Socially Integrated  . Frequency of Communication with Friends and Family: More than three times a week  . Frequency of Social Gatherings with Friends and Family: More than three times a week  . Attends Religious Services: More than 4 times per year  . Active Member of Clubs or Organizations: Yes  . Attends Archivist Meetings: More than 4 times per year  . Marital Status: Married    Tobacco Counseling Counseling given: Not Answered   Clinical Intake:  Pre-visit preparation completed: Yes  Pain : No/denies pain     Nutritional Risks: None Diabetes: No  How often do you need to have someone help you when you read instructions, pamphlets, or other written materials from your doctor or pharmacy?: 1 - Never  Diabetic? No  Interpreter Needed?: No  Information entered by :: [facility], LPN   Activities of Daily Living In your present state of health, do you have any difficulty performing the following activities: 02/17/2020    Hearing? N  Vision? N  Difficulty concentrating or making decisions? N  Walking or climbing stairs? N  Dressing or bathing? N  Doing errands, shopping? N  Preparing Food and eating ? N  Using the Toilet? N  In the past six months, have you accidently leaked urine? N  Do you have problems with loss of bowel control? N  Managing your Medications? N  Managing your Finances? N  Housekeeping or managing your Housekeeping? N  Some recent data might be hidden    Patient Care Team: Rubye Beach as PCP - General (Family Medicine) Eulogio Bear, MD as Consulting Physician (Ophthalmology) Oneta Rack, MD (Dermatology)  Indicate any recent Medical Services you may have received from other than [facility] providers in the past year (date may be approximate).     Assessment:   This is a routine wellness examination for Shanaye.  Hearing/Vision screen No  exam data present  Dietary issues and exercise activities discussed: Current Exercise Habits: The patient does not participate in regular exercise at present, Exercise limited by: None identified  Goals    . DIET - INCREASE WATER INTAKE     Recommend increasing water intake to 6-8 glasses a day.     . Exercise 3x per week (30 min per time)     Recommend to start exercising 3 days a week for at least 30 minutes at a time.      Depression Screen PHQ 2/9 Scores 02/17/2020 02/13/2019 02/07/2018 02/07/2018 12/05/2016 12/03/2015  PHQ - 2 Score 0 0 0 0 0 0  PHQ- 9 Score - - 0 - 1 -    Fall Risk Fall Risk  02/17/2020 02/13/2019 02/07/2018 12/05/2016 12/03/2015  Falls in the past year? 0 0 No No No  Number falls in past yr: 0 - - - -  Injury with Fall? 0 - - - -    Any stairs in or around the home? Yes  If so, are there any without handrails? No  Home free of loose throw rugs in walkways, pet beds, electrical cords, etc? Yes  Adequate lighting in your home to reduce risk of falls? Yes   ASSISTIVE DEVICES UTILIZED TO PREVENT  FALLS:  Life alert? No  Use of a cane, walker or w/c? No  Grab bars in the bathroom? Yes  Shower chair or bench in shower? No  Elevated toilet seat or a handicapped toilet? Yes    Cognitive Function: Declined today.        Immunizations Immunization History  Administered Date(s) Administered  . Influenza, High Dose Seasonal PF 05/13/2014, 04/01/2016, 05/03/2017  . Influenza,inj,Quad PF,6+ Mos 05/08/2013  . Influenza-Unspecified 05/26/2015, 04/17/2018  . Moderna SARS-COVID-2 Vaccination 08/30/2019, 10/01/2019  . Pneumococcal Conjugate-13 05/13/2014  . Pneumococcal Polysaccharide-23 05/08/2013  . Td 04/25/2003  . Tdap 08/18/2010  . Zoster 06/02/2008  . Zoster Recombinat (Shingrix) 02/20/2018, 06/01/2018, 06/25/2018    TDAP status: Up to date Flu Vaccine status: Due fall 2021 Pneumococcal vaccine status: Up to date Covid-19 vaccine status: Completed vaccines  Qualifies for Shingles Vaccine? Yes   Zostavax completed Yes   Shingrix Completed?: Yes  Screening Tests Health Maintenance  Topic Date Due  . INFLUENZA VACCINE  02/23/2020  . TETANUS/TDAP  08/18/2020  . DEXA SCAN  02/24/2021  . MAMMOGRAM  03/10/2021  . COLONOSCOPY  03/02/2022  . COVID-19 Vaccine  Completed  . Hepatitis C Screening  Completed  . PNA vac Low Risk Adult  Completed    Health Maintenance  There are no preventive care reminders to display for this patient.  Colorectal cancer screening: Completed 03/02/17. Repeat every 5 years Mammogram status: Completed 03/11/19. Repeat every year Bone Density status: Completed 02/25/16. Results reflect: Bone density results: OSTEOPENIA. Repeat every 5 years.  Lung Cancer Screening: (Low Dose CT Chest recommended if Age 59-80 years, 30 pack-year currently smoking OR have quit w/in 15years.) does not qualify.    Additional Screening:  Hepatitis C Screening: Up to date  Vision Screening: Recommended annual ophthalmology exams for early detection of glaucoma and  other disorders of the eye. Is the patient up to date with their annual eye exam?  Yes  Who is the provider or what is the name of the office in which the patient attends annual eye exams? Dr Edison Pace at [facility] If pt is not established with a provider, would they like to be referred to a provider to establish care?  No .   Dental Screening: Recommended annual dental exams for proper oral hygiene  Community Resource Referral / Chronic Care Management: CRR required this visit?  No   CCM required this visit?  No      Plan:     I have personally reviewed and noted the following in the patient's chart:   . Medical and social history . Use of alcohol, tobacco or illicit drugs  . Current medications and supplements . Functional ability and status . Nutritional status . Physical activity . Advanced directives . List of other physicians . Hospitalizations, surgeries, and ER visits in previous 12 months . Vitals . Screenings to include cognitive, depression, and falls . Referrals and appointments  In addition, I have reviewed and discussed with patient certain preventive protocols, quality metrics, and best practice recommendations. A written personalized care plan for preventive services as well as general preventive health recommendations were provided to patient.     Lean Jaeger Jennings, [place]   10/01/4074   Nurse Notes: None.

## 2020-02-17 ENCOUNTER — Other Ambulatory Visit: Payer: Self-pay

## 2020-02-17 ENCOUNTER — Ambulatory Visit (INDEPENDENT_AMBULATORY_CARE_PROVIDER_SITE_OTHER): Payer: Medicare HMO

## 2020-02-17 DIAGNOSIS — Z0000 Encounter for general adult medical examination without abnormal findings: Secondary | ICD-10-CM

## 2020-02-17 NOTE — Patient Instructions (Signed)
Cindy Vega , Thank you for taking time to come for your Medicare Wellness Visit. I appreciate your ongoing commitment to your health goals. Please review the following plan we discussed and let me know if I can assist you in the future.   Screening recommendations/referrals: Colonoscopy: Up to date, due 02/2022 Mammogram: Up to date, scheduled 03/12/20 Bone Density: Up to date, due 02/2021 Recommended yearly ophthalmology/optometry visit for glaucoma screening and checkup Recommended yearly dental visit for hygiene and checkup  Vaccinations: Influenza vaccine: Due fall 2021 Pneumococcal vaccine: Completed series Tdap vaccine: Up to date, due 07/2020 Shingles vaccine: Completed series    Advanced directives: Please bring a copy of your POA (Power of [place]) and/or Living Will to your next appointment.   Conditions/risks identified: Recommend to start exercising 3 days a week for at least 30 minutes at a time. Also recommend to increase water intake to 6-8 8 oz glasses a day.   Next appointment: 03/31/20 @ 2:00 PM with [place] 65 Years and Older, Female Preventive care refers to lifestyle choices and visits with your health care provider that can promote health and wellness. What does preventive care include?  A yearly physical exam. This is also called an annual well check.  Dental exams once or twice a year.  Routine eye exams. Ask your health care provider how often you should have your eyes checked.  Personal lifestyle choices, including:  Daily care of your teeth and gums.  Regular physical activity.  Eating a healthy diet.  Avoiding tobacco and drug use.  Limiting alcohol use.  Practicing safe sex.  Taking low-dose aspirin every day.  Taking vitamin and mineral supplements as recommended by your health care provider. What happens during an annual well check? The services and screenings done by your health care provider during your  annual well check will depend on your age, overall health, lifestyle risk factors, and family history of disease. Counseling  Your health care provider may ask you questions about your:  Alcohol use.  Tobacco use.  Drug use.  Emotional well-being.  Home and relationship well-being.  Sexual activity.  Eating habits.  History of falls.  Memory and ability to understand (cognition).  Work and work Statistician.  Reproductive health. Screening  You may have the following tests or measurements:  Height, weight, and BMI.  Blood pressure.  Lipid and cholesterol levels. These may be checked every 5 years, or more frequently if you are over 20 years old.  Skin check.  Lung cancer screening. You may have this screening every year starting at age 10 if you have a 30-pack-year history of smoking and currently smoke or have quit within the past 15 years.  Fecal occult blood test (FOBT) of the stool. You may have this test every year starting at age 25.  Flexible sigmoidoscopy or colonoscopy. You may have a sigmoidoscopy every 5 years or a colonoscopy every 10 years starting at age 63.  Hepatitis C blood test.  Hepatitis B blood test.  Sexually transmitted disease (STD) testing.  Diabetes screening. This is done by checking your blood sugar (glucose) after you have not eaten for a while (fasting). You may have this done every 1-3 years.  Bone density scan. This is done to screen for osteoporosis. You may have this done starting at age 90.  Mammogram. This may be done every 1-2 years. Talk to your health care provider about how often you should have regular mammograms. Talk with your  health care provider about your test results, treatment options, and if necessary, the need for more tests. Vaccines  Your health care provider may recommend certain vaccines, such as:  Influenza vaccine. This is recommended every year.  Tetanus, diphtheria, and acellular pertussis (Tdap, Td)  vaccine. You may need a Td booster every 10 years.  Zoster vaccine. You may need this after age 58.  Pneumococcal 13-valent conjugate (PCV13) vaccine. One dose is recommended after age 94.  Pneumococcal polysaccharide (PPSV23) vaccine. One dose is recommended after age 30. Talk to your health care provider about which screenings and vaccines you need and how often you need them. This information is not intended to replace advice given to you by your health care provider. Make sure you discuss any questions you have with your health care provider. Document Released: 08/07/2015 Document Revised: 03/30/2016 Document Reviewed: 05/12/2015 Elsevier Interactive Patient Education  2017 [place] Prevention in the Home Falls can cause injuries. They can happen to people of all ages. There are many things you can do to make your home safe and to help prevent falls. What can I do on the outside of my home?  Regularly fix the edges of walkways and driveways and fix any cracks.  Remove anything that might make you trip as you walk through a door, such as a raised step or threshold.  Trim any bushes or trees on the path to your home.  Use bright outdoor lighting.  Clear any walking paths of anything that might make someone trip, such as rocks or tools.  Regularly check to see if handrails are loose or broken. Make sure that both sides of any steps have handrails.  Any raised decks and porches should have guardrails on the edges.  Have any leaves, snow, or ice cleared regularly.  Use sand or salt on walking paths during winter.  Clean up any spills in your garage right away. This includes oil or grease spills. What can I do in the bathroom?  Use night lights.  Install grab bars by the toilet and in the tub and shower. Do not use towel bars as grab bars.  Use non-skid mats or decals in the tub or shower.  If you need to sit down in the shower, use a plastic, non-slip  stool.  Keep the floor dry. Clean up any water that spills on the floor as soon as it happens.  Remove soap buildup in the tub or shower regularly.  Attach bath mats securely with double-sided non-slip rug tape.  Do not have throw rugs and other things on the floor that can make you trip. What can I do in the bedroom?  Use night lights.  Make sure that you have a light by your bed that is easy to reach.  Do not use any sheets or blankets that are too big for your bed. They should not hang down onto the floor.  Have a firm chair that has side arms. You can use this for support while you get dressed.  Do not have throw rugs and other things on the floor that can make you trip. What can I do in the kitchen?  Clean up any spills right away.  Avoid walking on wet floors.  Keep items that you use a lot in easy-to-reach places.  If you need to reach something above you, use a strong step stool that has a grab bar.  Keep electrical cords out of the way.  Do not use  floor polish or wax that makes floors slippery. If you must use wax, use non-skid floor wax.  Do not have throw rugs and other things on the floor that can make you trip. What can I do with my stairs?  Do not leave any items on the stairs.  Make sure that there are handrails on both sides of the stairs and use them. Fix handrails that are broken or loose. Make sure that handrails are as long as the stairways.  Check any carpeting to make sure that it is firmly attached to the stairs. Fix any carpet that is loose or worn.  Avoid having throw rugs at the top or bottom of the stairs. If you do have throw rugs, attach them to the floor with carpet tape.  Make sure that you have a light switch at the top of the stairs and the bottom of the stairs. If you do not have them, ask someone to add them for you. What else can I do to help prevent falls?  Wear shoes that:  Do not have high heels.  Have rubber bottoms.  Are  comfortable and fit you well.  Are closed at the toe. Do not wear sandals.  If you use a stepladder:  Make sure that it is fully opened. Do not climb a closed stepladder.  Make sure that both sides of the stepladder are locked into place.  Ask someone to hold it for you, if possible.  Clearly mark and make sure that you can see:  Any grab bars or handrails.  First and last steps.  Where the edge of each step is.  Use tools that help you move around (mobility aids) if they are needed. These include:  Canes.  Walkers.  Scooters.  Crutches.  Turn on the lights when you go into a dark area. Replace any light bulbs as soon as they burn out.  Set up your furniture so you have a clear path. Avoid moving your furniture around.  If any of your floors are uneven, fix them.  If there are any pets around you, be aware of where they are.  Review your medicines with your doctor. Some medicines can make you feel dizzy. This can increase your chance of falling. Ask your doctor what other things that you can do to help prevent falls. This information is not intended to replace advice given to you by your health care provider. Make sure you discuss any questions you have with your health care provider. Document Released: 05/07/2009 Document Revised: 12/17/2015 Document Reviewed: 08/15/2014 Elsevier Interactive Patient Education  2017 [employer].

## 2020-03-12 ENCOUNTER — Other Ambulatory Visit: Payer: Self-pay

## 2020-03-12 ENCOUNTER — Ambulatory Visit
Admission: RE | Admit: 2020-03-12 | Discharge: 2020-03-12 | Disposition: A | Discharge status: Home / Self Care | Payer: Medicare HMO | Source: Ambulatory Visit | Attending: Physician Assistant | Admitting: Physician Assistant

## 2020-03-12 DIAGNOSIS — Z1231 Encounter for screening mammogram for malignant neoplasm of breast: Secondary | ICD-10-CM | POA: Insufficient documentation

## 2020-03-13 ENCOUNTER — Telehealth: Payer: Self-pay

## 2020-03-13 NOTE — Telephone Encounter (Signed)
Patient advised as directed below. 

## 2020-03-13 NOTE — Telephone Encounter (Signed)
-----   Message from Mar Daring, [place] sent at 03/13/2020  4:53 PM EDT ----- Normal mammogram. Repeat screening in one year.

## 2020-03-31 ENCOUNTER — Ambulatory Visit (INDEPENDENT_AMBULATORY_CARE_PROVIDER_SITE_OTHER): Payer: Medicare HMO | Admitting: Physician Assistant

## 2020-03-31 ENCOUNTER — Other Ambulatory Visit: Payer: Self-pay

## 2020-03-31 ENCOUNTER — Encounter: Payer: Self-pay | Admitting: Physician Assistant

## 2020-03-31 VITALS — BP 135/87 | HR 89 | Temp 98.8°F | Resp 16 | Ht 66.0 in | Wt 173.4 lb

## 2020-03-31 DIAGNOSIS — E7800 Pure hypercholesterolemia, unspecified: Secondary | ICD-10-CM | POA: Diagnosis not present

## 2020-03-31 DIAGNOSIS — Z0000 Encounter for general adult medical examination without abnormal findings: Secondary | ICD-10-CM

## 2020-03-31 DIAGNOSIS — I10 Essential (primary) hypertension: Secondary | ICD-10-CM | POA: Diagnosis not present

## 2020-03-31 DIAGNOSIS — I951 Orthostatic hypotension: Secondary | ICD-10-CM

## 2020-03-31 DIAGNOSIS — G4733 Obstructive sleep apnea (adult) (pediatric): Secondary | ICD-10-CM | POA: Diagnosis not present

## 2020-03-31 DIAGNOSIS — R748 Abnormal levels of other serum enzymes: Secondary | ICD-10-CM

## 2020-03-31 DIAGNOSIS — Z23 Encounter for immunization: Secondary | ICD-10-CM | POA: Diagnosis not present

## 2020-03-31 DIAGNOSIS — R739 Hyperglycemia, unspecified: Secondary | ICD-10-CM

## 2020-03-31 NOTE — Progress Notes (Addendum)
Complete physical exam   Patient: Cindy Vega   DOB: Sep 24, 1945   74 y.o. Female  MRN: [id number] Visit Date: 03/31/2020  Today's healthcare provider: Mar Daring, PA-C   Chief Complaint  Patient presents with  . Annual Exam   Subjective    Cindy Vega is a 74 y.o. female who presents today for a complete physical exam.  She reports consuming a general diet. The patient does not participate in regular exercise at present. She generally feels well. She reports sleeping well. She does have additional problems to discuss today.  HPI  Patient had AWV with [facility] on 02/17/2020.  She reports that in the last week she has been feeling lightheaded when she stands up. Mostly first thing in the morning and if she bends over with her head down. Only last for a few seconds.    Past Medical History:  Diagnosis Date  . Arthritis    fingers  . Cancer ([place])    basal cell  . Hypertension   . PONV (postoperative nausea and vomiting)    after hysterectomy   Past Surgical History:  Procedure Laterality Date  . ABDOMINAL HYSTERECTOMY    . BREAST CYST EXCISION Right 1996   cyst removed-neg  . CATARACT EXTRACTION    . COLONOSCOPY WITH PROPOFOL N/A 03/02/2017   Procedure: COLONOSCOPY WITH PROPOFOL;  Surgeon: Lucilla Lame, MD;  Location: [facility];  Service: Endoscopy;  Laterality: N/A;  . POLYPECTOMY N/A 03/02/2017   Procedure: POLYPECTOMY;  Surgeon: Lucilla Lame, MD;  Location: [facility];  Service: Endoscopy;  Laterality: N/A;   Social History   Socioeconomic History  . Marital status: Married    Spouse name: Not on file  . Number of children: 2  . Years of education: Not on file  . Highest education level: Bachelor's degree (e.g., BA, AB, BS)  Occupational History  . Occupation: retired  Tobacco Use  . Smoking status: Never Smoker  . Smokeless tobacco: Never Used  Vaping Use  . Vaping Use: Never used  Substance and Sexual Activity  . Alcohol  use: Yes    Alcohol/week: 3.0 standard drinks    Types: 3 Glasses of wine per week  . Drug use: No  . Sexual activity: Not on file  Other Topics Concern  . Not on file  Social History Narrative  . Not on file   Social Determinants of Health   Financial Resource Strain: Low Risk   . Difficulty of Paying Living Expenses: Not hard at all  Food Insecurity: No Food Insecurity  . Worried About Charity fundraiser in the Last Year: Never true  . Ran Out of Food in the Last Year: Never true  Transportation Needs: No Transportation Needs  . Lack of Transportation (Medical): No  . Lack of Transportation (Non-Medical): No  Physical Activity: Inactive  . Days of Exercise per Week: 0 days  . Minutes of Exercise per Session: 0 min  Stress: Stress Concern Present  . Feeling of Stress : To some extent  Social Connections: Socially Integrated  . Frequency of Communication with Friends and Family: More than three times a week  . Frequency of Social Gatherings with Friends and Family: More than three times a week  . Attends Religious Services: More than 4 times per year  . Active Member of Clubs or Organizations: Yes  . Attends Archivist Meetings: More than 4 times per year  . Marital Status: Married  Intimate Partner Violence: Not At Risk  . Fear of Current or Ex-Partner: No  . Emotionally Abused: No  . Physically Abused: No  . Sexually Abused: No   Family Status  Relation Name Status  . Sister  Alive  . Mother  Alive  . Father  Deceased at age 58  . Brother  Alive  . Sister  Alive  . Sister  Alive  . Daughter  Alive  . Son  Alive   Family History  Problem Relation Age of Onset  . Breast cancer Sister 73  . Hypertension Mother   . Heart disease Mother   . Diabetes Mother   . Diabetes Father   . Prostate cancer Father   . Graves' disease Sister   . Breast cancer Sister    Allergies  Allergen Reactions  . Silver Nitrate     LIQUID NITRATE used to remove warts      Patient Care Team: Mar Daring, PA-C as PCP - General (Family Medicine) Eulogio Bear, MD as Consulting Physician (Ophthalmology) Oneta Rack, MD (Dermatology)   Medications: Outpatient Medications Prior to Visit  Medication Sig  . aspirin 81 MG tablet Take by mouth every other day.   . Biotin 5 MG CAPS daily.   . cholecalciferol (VITAMIN D) 1000 units tablet Take 1,000 Units by mouth daily.   Marland Kitchen FLUoxetine (PROZAC) 10 MG capsule TAKE 1 CAPSULE EVERY DAY  . latanoprost (XALATAN) 0.005 % ophthalmic solution 1 drop at bedtime.  . MULTIPLE VITAMIN PO Take by mouth daily.   . Omega-3 Fatty Acids (FISH OIL) 1000 MG CAPS daily.   . psyllium (METAMUCIL) 58.6 % packet Take 1 packet by mouth daily.  Marland Kitchen triamterene-hydrochlorothiazide (MAXZIDE-25) 37.5-25 MG tablet Take 1 tablet by mouth daily.   No facility-administered medications prior to visit.    Review of Systems  Constitutional: Negative.   HENT: Negative.   Eyes: Negative.   Respiratory: Negative.   Cardiovascular: Negative.   Gastrointestinal: Negative.   Endocrine: Negative.   Genitourinary: Negative.   Musculoskeletal: Negative.   Skin: Negative.   Allergic/Immunologic: Negative.   Neurological: Positive for light-headedness.  Hematological: Negative.   Psychiatric/Behavioral: Negative.     Last CBC Lab Results  Component Value Date   WBC 5.7 03/06/2019   HGB 13.1 03/06/2019   HCT 37.5 03/06/2019   MCV 94 03/06/2019   MCH 32.7 03/06/2019   RDW 13.0 03/06/2019   PLT 273 52/77/8242   Last metabolic panel Lab Results  Component Value Date   GLUCOSE 100 (H) 03/06/2019   NA 137 03/06/2019   K 4.0 03/06/2019   CL 100 03/06/2019   CO2 22 03/06/2019   BUN 17 03/06/2019   CREATININE 1.03 (H) 03/06/2019   GFRNONAA 55 (L) 03/06/2019   GFRAA 63 03/06/2019   CALCIUM 9.7 03/06/2019   PROT 6.6 03/06/2019   ALBUMIN 4.4 03/06/2019   LABGLOB 2.2 03/06/2019   AGRATIO 2.0 03/06/2019   BILITOT 0.3  03/06/2019   ALKPHOS 47 03/06/2019   AST 22 03/06/2019   ALT 26 03/06/2019   ANIONGAP 6 (L) 08/07/2012      Objective    BP 135/87 (BP Location: Left Arm, Patient Position: Sitting, Cuff Size: Large)   Pulse 89   Temp 98.8 F (37.1 C) (Oral)   Resp 16   Ht 5\' 6"  (1.676 m)   Wt 173 lb 6.4 oz (78.7 kg)   BMI 27.99 kg/m  BP Readings from Last 3 Encounters:  03/31/20  135/87  01/19/20 (!) 163/77  03/06/19 137/83   Wt Readings from Last 3 Encounters:  03/31/20 173 lb 6.4 oz (78.7 kg)  01/19/20 165 lb (74.8 kg)  03/06/19 168 lb (76.2 kg)      Physical Exam Vitals reviewed.  Constitutional:      General: She is not in acute distress.    Appearance: Normal appearance. She is well-developed, well-groomed and overweight. She is not ill-appearing or diaphoretic.  HENT:     Head: Normocephalic and atraumatic.     Right Ear: Tympanic membrane, ear canal and external ear normal.     Left Ear: Tympanic membrane, ear canal and external ear normal.     Nose: Nose normal.     Mouth/Throat:     Mouth: Mucous membranes are moist.     Pharynx: Oropharynx is clear. No oropharyngeal exudate.  Eyes:     General: No scleral icterus.       Right eye: No discharge.        Left eye: No discharge.     Extraocular Movements: Extraocular movements intact.     Conjunctiva/sclera: Conjunctivae normal.     Pupils: Pupils are equal, round, and reactive to light.  Neck:     Thyroid: No thyromegaly.     Vascular: No carotid bruit or JVD.     Trachea: No tracheal deviation.  Cardiovascular:     Rate and Rhythm: Normal rate and regular rhythm.     Pulses: Normal pulses.     Heart sounds: Normal heart sounds. No murmur heard.  No friction rub. No gallop.   Pulmonary:     Effort: Pulmonary effort is normal. No respiratory distress.     Breath sounds: Normal breath sounds. No wheezing or rales.  Chest:     Chest wall: No tenderness.  Abdominal:     General: Abdomen is flat. Bowel sounds are  normal. There is no distension.     Palpations: Abdomen is soft. There is no mass.     Tenderness: There is no abdominal tenderness. There is no guarding or rebound.  Musculoskeletal:        General: No tenderness. Normal range of motion.     Cervical back: Normal range of motion and neck supple.     Right lower leg: No edema.     Left lower leg: No edema.  Lymphadenopathy:     Cervical: No cervical adenopathy.  Skin:    General: Skin is warm and dry.     Capillary Refill: Capillary refill takes less than 2 seconds.     Findings: No rash.  Neurological:     General: No focal deficit present.     Mental Status: She is alert and oriented to person, place, and time. Mental status is at baseline.  Psychiatric:        Mood and Affect: Mood normal.        Behavior: Behavior normal. Behavior is cooperative.        Thought Content: Thought content normal.        Judgment: Judgment normal.      Last depression screening scores PHQ 2/9 Scores 03/31/2020 02/17/2020 02/13/2019  PHQ - 2 Score 0 0 0  PHQ- 9 Score 0 - -   Last fall risk screening Fall Risk  02/17/2020  Falls in the past year? 0  Number falls in past yr: 0  Injury with Fall? 0   Last Audit-C alcohol use screening Alcohol Use Disorder Test (AUDIT) 02/17/2020  1. How often do you have a drink containing alcohol? 3  2. How many drinks containing alcohol do you have on a typical day when you are drinking? 0  3. How often do you have six or more drinks on one occasion? 0  AUDIT-C Score 3  4. How often during the last year have you found that you were not able to stop drinking once you had started? 0  5. How often during the last year have you failed to do what was normally expected from you because of drinking? 0  6. How often during the last year have you needed a first drink in the morning to get yourself going after a heavy drinking session? 0  7. How often during the last year have you had a feeling of guilt of remorse after  drinking? 0  8. How often during the last year have you been unable to remember what happened the night before because you had been drinking? 0  9. Have you or someone else been injured as a result of your drinking? 0  10. Has a relative or friend or a doctor or another health worker been concerned about your drinking or suggested you cut down? 0  Alcohol Use Disorder Identification Test Final Score (AUDIT) 3  Alcohol Brief Interventions/Follow-up AUDIT Score <7 follow-up not indicated   A score of 3 or more in women, and 4 or more in men indicates increased risk for alcohol abuse, EXCEPT if all of the points are from question 1   No results found for any visits on 03/31/20.  Assessment & Plan    Routine Health Maintenance and Physical Exam  Exercise Activities and Dietary recommendations Goals    . DIET - INCREASE WATER INTAKE     Recommend increasing water intake to 6-8 glasses a day.     . Exercise 3x per week (30 min per time)     Recommend to start exercising 3 days a week for at least 30 minutes at a time.       Immunization History  Administered Date(s) Administered  . Influenza, High Dose Seasonal PF 05/13/2014, 04/01/2016, 05/03/2017  . Influenza,inj,Quad PF,6+ Mos 05/08/2013  . Influenza-Unspecified 05/26/2015, 04/17/2018  . Moderna SARS-COVID-2 Vaccination 08/30/2019, 10/01/2019  . Pneumococcal Conjugate-13 05/13/2014  . Pneumococcal Polysaccharide-23 05/08/2013  . Td 04/25/2003  . Tdap 08/18/2010  . Zoster 06/02/2008  . Zoster Recombinat (Shingrix) 02/20/2018, 06/01/2018, 06/25/2018    Health Maintenance  Topic Date Due  . INFLUENZA VACCINE  02/23/2020  . TETANUS/TDAP  08/18/2020  . DEXA SCAN  02/24/2021  . COLONOSCOPY  03/02/2022  . MAMMOGRAM  03/12/2022  . COVID-19 Vaccine  Completed  . Hepatitis C Screening  Completed  . PNA vac Low Risk Adult  Completed    Discussed health benefits of physical activity, and encouraged her to engage in regular  exercise appropriate for her age and condition.  1. Annual physical exam Normal physical exam today. Will check labs as below and f/u pending lab results. If labs are stable and WNL she will not need to have these rechecked for one year at her next annual physical exam. She is to call the office in the meantime if she has any acute issue, questions or concerns.  2. Hypercholesteremia Diet controlled. Will check labs as below and f/u pending results. - CBC with Differential/Platelet - Comprehensive metabolic panel - Hemoglobin A1c - Lipid panel - TSH  3. Essential hypertension Stable on Maxzide 37.5-25mg . Will check labs  as below and f/u pending results. - CBC with Differential/Platelet - Comprehensive metabolic panel - Hemoglobin A1c - Lipid panel - TSH  4. Blood glucose elevated Diet controlled. Will check labs as below and f/u pending results. - CBC with Differential/Platelet - Comprehensive metabolic panel - Hemoglobin A1c - TSH  5. Abnormal liver enzymes Diet controlled. Will check labs as below and f/u pending results. - CBC with Differential/Platelet - Comprehensive metabolic panel - Lipid panel - TSH  6. Need for influenza vaccination Flu vaccine given today without complication. Patient sat upright for 15 minutes to check for adverse reaction before being released. - Flu Vaccine QUAD High Dose(Fluad)  7. Orthostatic hypotension Discussed possible causes such as dehydration. Advised to push fluids. Change positions slowly.   8. Obstructive sleep apnea syndrome Uses CPAP nightly. Has improvement in daytime fatigue symptoms. Continue CPAP use.    No follow-ups on file.     Reynolds Bowl, PA-C, have reviewed all documentation for this visit. The documentation on 03/31/20 for the exam, diagnosis, procedures, and orders are all accurate and complete.   Rubye Beach  [facility] [phone] (phone) [phone]  (fax)  [place]

## 2020-03-31 NOTE — Patient Instructions (Signed)

## 2020-04-02 DIAGNOSIS — G4733 Obstructive sleep apnea (adult) (pediatric): Secondary | ICD-10-CM | POA: Diagnosis not present

## 2020-04-10 DIAGNOSIS — E7800 Pure hypercholesterolemia, unspecified: Secondary | ICD-10-CM | POA: Diagnosis not present

## 2020-04-10 DIAGNOSIS — R748 Abnormal levels of other serum enzymes: Secondary | ICD-10-CM | POA: Diagnosis not present

## 2020-04-10 DIAGNOSIS — L821 Other seborrheic keratosis: Secondary | ICD-10-CM | POA: Diagnosis not present

## 2020-04-10 DIAGNOSIS — D2371 Other benign neoplasm of skin of right lower limb, including hip: Secondary | ICD-10-CM | POA: Diagnosis not present

## 2020-04-10 DIAGNOSIS — Z85828 Personal history of other malignant neoplasm of skin: Secondary | ICD-10-CM | POA: Diagnosis not present

## 2020-04-10 DIAGNOSIS — Z08 Encounter for follow-up examination after completed treatment for malignant neoplasm: Secondary | ICD-10-CM | POA: Diagnosis not present

## 2020-04-10 DIAGNOSIS — R739 Hyperglycemia, unspecified: Secondary | ICD-10-CM | POA: Diagnosis not present

## 2020-04-10 DIAGNOSIS — I10 Essential (primary) hypertension: Secondary | ICD-10-CM | POA: Diagnosis not present

## 2020-04-11 LAB — CBC WITH DIFFERENTIAL/PLATELET
Basophils Absolute: 0.1 10*3/uL (ref 0.0–0.2)
Basos: 1 %
EOS (ABSOLUTE): 0.3 10*3/uL (ref 0.0–0.4)
Eos: 5 %
Hematocrit: 39.6 % (ref 34.0–46.6)
Hemoglobin: 13.4 g/dL (ref 11.1–15.9)
Immature Grans (Abs): 0 10*3/uL (ref 0.0–0.1)
Immature Granulocytes: 0 %
Lymphocytes Absolute: 2.1 10*3/uL (ref 0.7–3.1)
Lymphs: 38 %
MCH: 32.4 pg (ref 26.6–33.0)
MCHC: 33.8 g/dL (ref 31.5–35.7)
MCV: 96 fL (ref 79–97)
Monocytes Absolute: 0.6 10*3/uL (ref 0.1–0.9)
Monocytes: 12 %
Neutrophils Absolute: 2.5 10*3/uL (ref 1.4–7.0)
Neutrophils: 44 %
Platelets: 287 10*3/uL (ref 150–450)
RBC: 4.13 x10E6/uL (ref 3.77–5.28)
RDW: 13.2 % (ref 11.7–15.4)
WBC: 5.5 10*3/uL (ref 3.4–10.8)

## 2020-04-11 LAB — COMPREHENSIVE METABOLIC PANEL
ALT: 41 IU/L — ABNORMAL HIGH (ref 0–32)
AST: 25 IU/L (ref 0–40)
Albumin/Globulin Ratio: 2 (ref 1.2–2.2)
Albumin: 4.4 g/dL (ref 3.7–4.7)
Alkaline Phosphatase: 57 IU/L (ref 44–121)
BUN/Creatinine Ratio: 13 (ref 12–28)
BUN: 13 mg/dL (ref 8–27)
Bilirubin Total: 0.4 mg/dL (ref 0.0–1.2)
CO2: 23 mmol/L (ref 20–29)
Calcium: 9.7 mg/dL (ref 8.7–10.3)
Chloride: 97 mmol/L (ref 96–106)
Creatinine, Ser: 1.01 mg/dL — ABNORMAL HIGH (ref 0.57–1.00)
GFR calc Af Amer: 64 mL/min/{1.73_m2} (ref >59)
GFR calc non Af Amer: 56 mL/min/{1.73_m2} — ABNORMAL LOW (ref >59)
Globulin, Total: 2.2 g/dL (ref 1.5–4.5)
Glucose: 102 mg/dL — ABNORMAL HIGH (ref 65–99)
Potassium: 4.1 mmol/L (ref 3.5–5.2)
Sodium: 135 mmol/L (ref 134–144)
Total Protein: 6.6 g/dL (ref 6.0–8.5)

## 2020-04-11 LAB — LIPID PANEL
Chol/HDL Ratio: 3.6 ratio (ref 0.0–4.4)
Cholesterol, Total: 231 mg/dL — ABNORMAL HIGH (ref 100–199)
HDL: 64 mg/dL (ref >39)
LDL Chol Calc (NIH): 135 mg/dL — ABNORMAL HIGH (ref 0–99)
Triglycerides: 183 mg/dL — ABNORMAL HIGH (ref 0–149)
VLDL Cholesterol Cal: 32 mg/dL (ref 5–40)

## 2020-04-11 LAB — TSH: TSH: 2.16 u[IU]/mL (ref 0.450–4.500)

## 2020-04-11 LAB — HEMOGLOBIN A1C
Est. average glucose Bld gHb Est-mCnc: 126 mg/dL
Hgb A1c MFr Bld: 6 % — ABNORMAL HIGH (ref 4.8–5.6)

## 2020-04-13 ENCOUNTER — Telehealth: Payer: Self-pay

## 2020-04-13 NOTE — Telephone Encounter (Signed)
-----   Message from Mar Daring, PA-C sent at 04/11/2020  7:19 PM EDT ----- Blood count is normal. Kidney function is stable. Sodium, potassium, and calcium are normal. Liver enzymes are physiologically normal. A1c/sugar has increased from a normal reading last year at 5.6 to now being prediabetic at 6.0. Cholesterol is stable but remains elevated. Limit fatty foods, red meats, processed meats, sugars and simple carbohydrates in diet. Increase physical activity as tolerated. Thyroid is normal.

## 2020-04-13 NOTE — Telephone Encounter (Signed)
Patient sent a message through my chart that she saw her labs and that she will work on her diet and exercise

## 2020-05-27 ENCOUNTER — Other Ambulatory Visit: Payer: Self-pay | Admitting: Physician Assistant

## 2020-05-27 DIAGNOSIS — F320 Major depressive disorder, single episode, mild: Secondary | ICD-10-CM

## 2020-05-27 NOTE — Telephone Encounter (Signed)
Requested Prescriptions  Pending Prescriptions Disp Refills   FLUoxetine (PROZAC) 10 MG capsule [Pharmacy Med Name: FLUOXETINE HYDROCHLORIDE 10 MG Capsule] 90 capsule 1    Sig: TAKE 1 CAPSULE EVERY DAY     Psychiatry:  Antidepressants - SSRI Passed - 05/27/2020  4:45 AM      Passed - Valid encounter within last 6 months    Recent Outpatient Visits          1 month ago Annual physical exam   [facility] [place], Clearnce Sorrel, [place]   1 year ago Annual physical exam   [place], [place], [place]   2 years ago Annual physical exam   [facility] [place], [place], [place]   3 years ago Medicare annual wellness visit, subsequent   [place], [place], [place]   4 years ago Medicare annual wellness visit, subsequent   [facility] Margarita Rana, [place]

## 2020-07-02 DIAGNOSIS — G4733 Obstructive sleep apnea (adult) (pediatric): Secondary | ICD-10-CM | POA: Diagnosis not present

## 2020-07-20 ENCOUNTER — Other Ambulatory Visit: Payer: Self-pay | Admitting: Physician Assistant

## 2020-07-20 DIAGNOSIS — I10 Essential (primary) hypertension: Secondary | ICD-10-CM

## 2020-08-25 DIAGNOSIS — H40053 Ocular hypertension, bilateral: Secondary | ICD-10-CM | POA: Diagnosis not present

## 2020-09-21 IMAGING — CR DG HAND COMPLETE 3+V*R*
3 series · 4 of 4 positions shown · non-contrast
Comparison: None

CLINICAL DATA: Possible ceramic foreign body at fifth metacarpal
region, broke a coffee mug in her RIGHT hand 2 weeks ago

EXAM:
RIGHT HAND - COMPLETE 3+ VIEW

[hand ap]
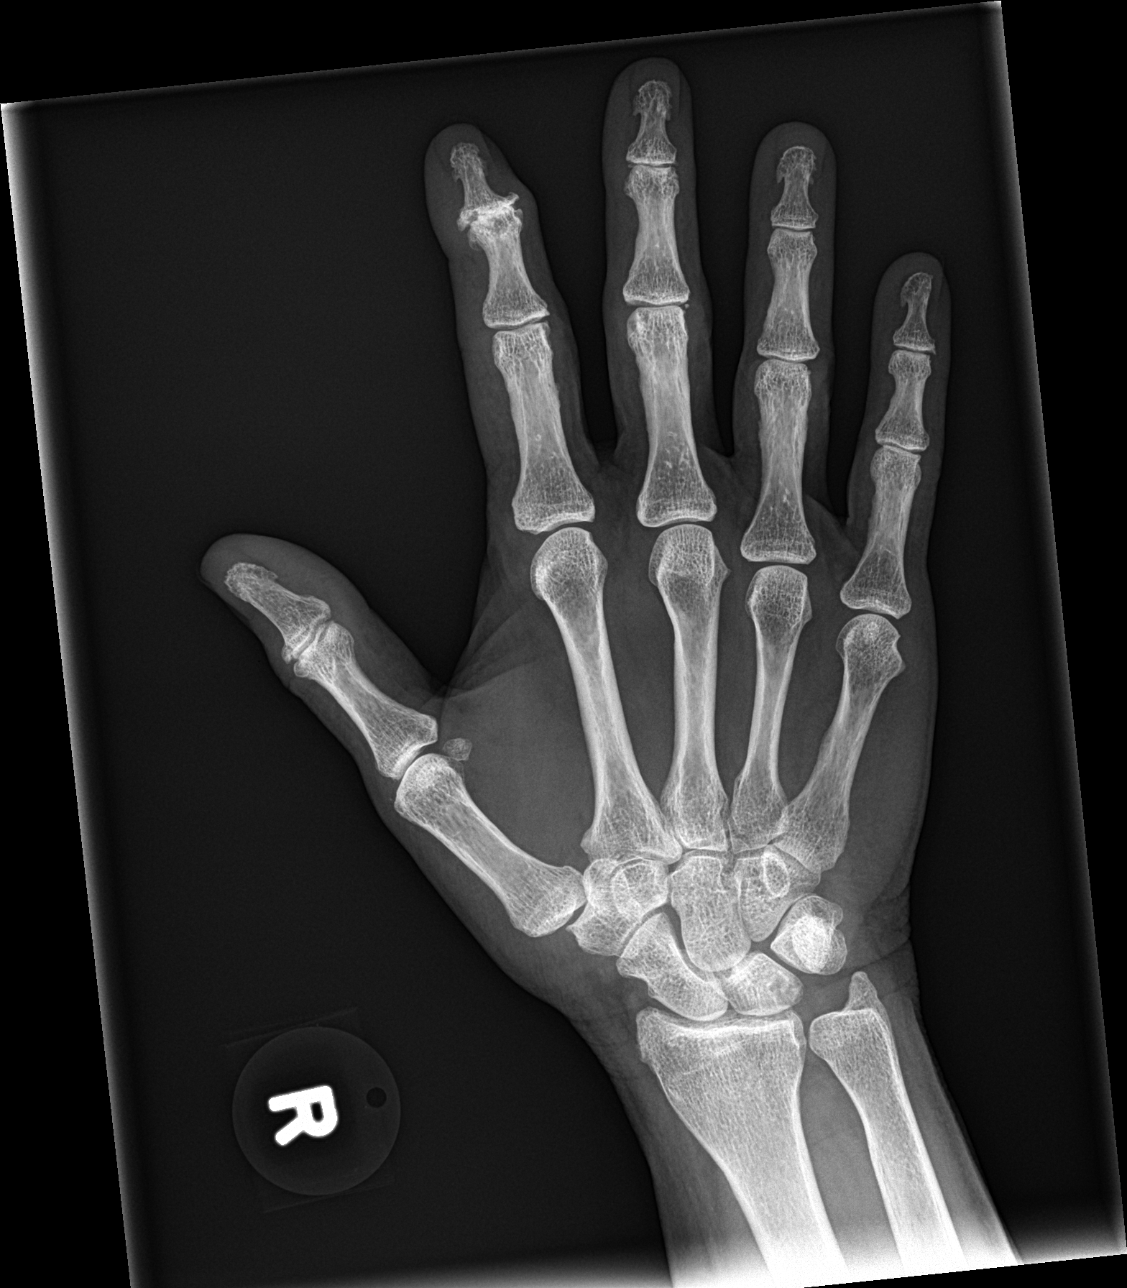

[hand obl]
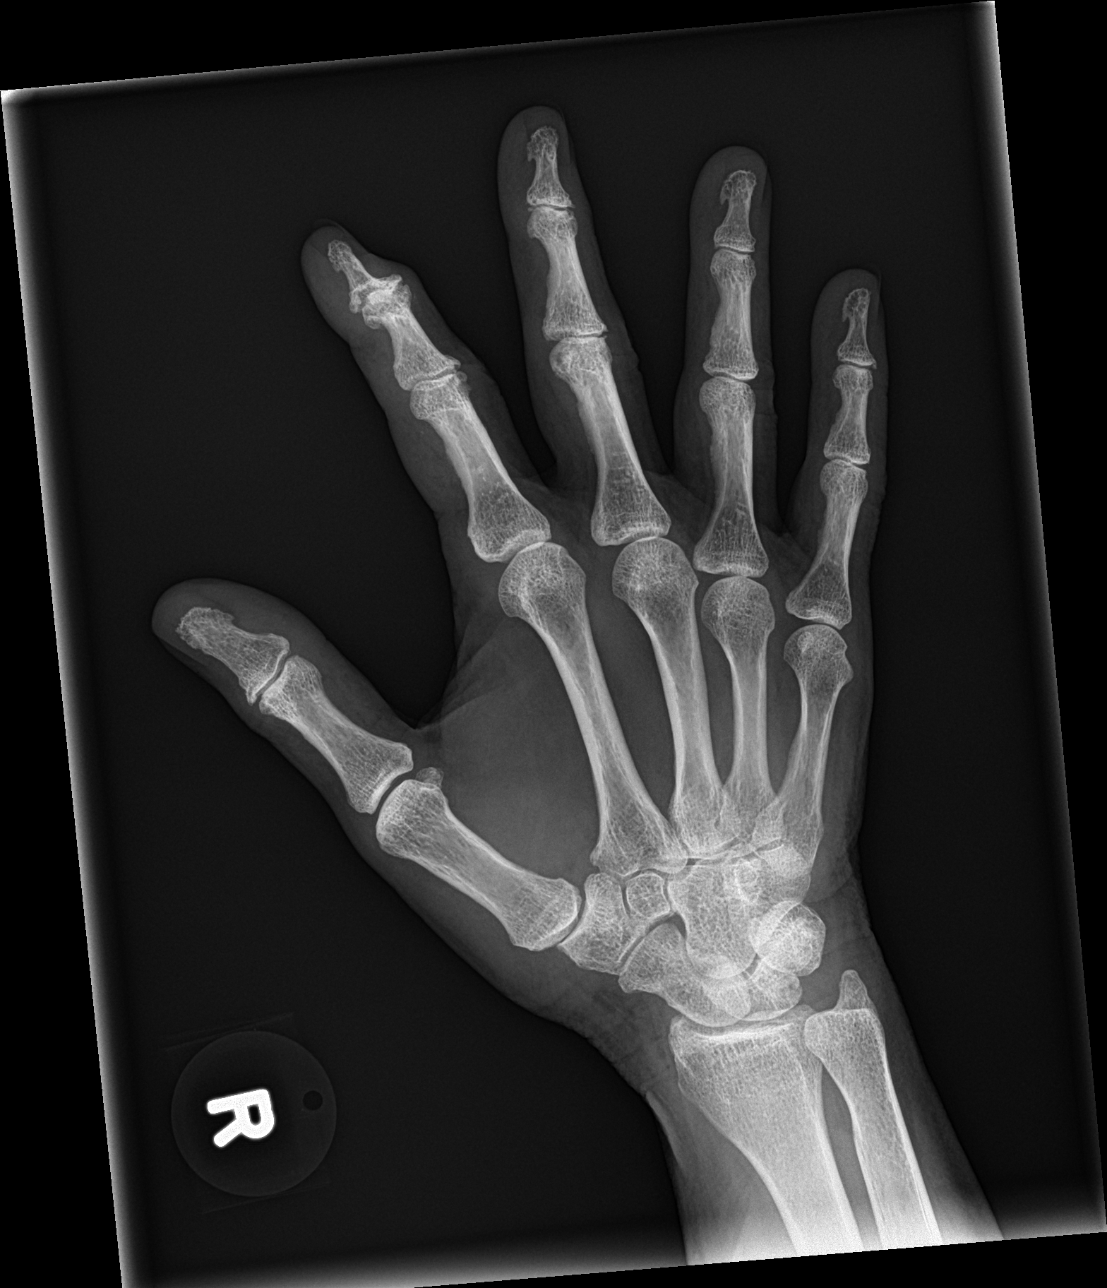

[Series 3: hand lat · 0.14mm/px · 2 of 2 slices shown]
[im 1/2]
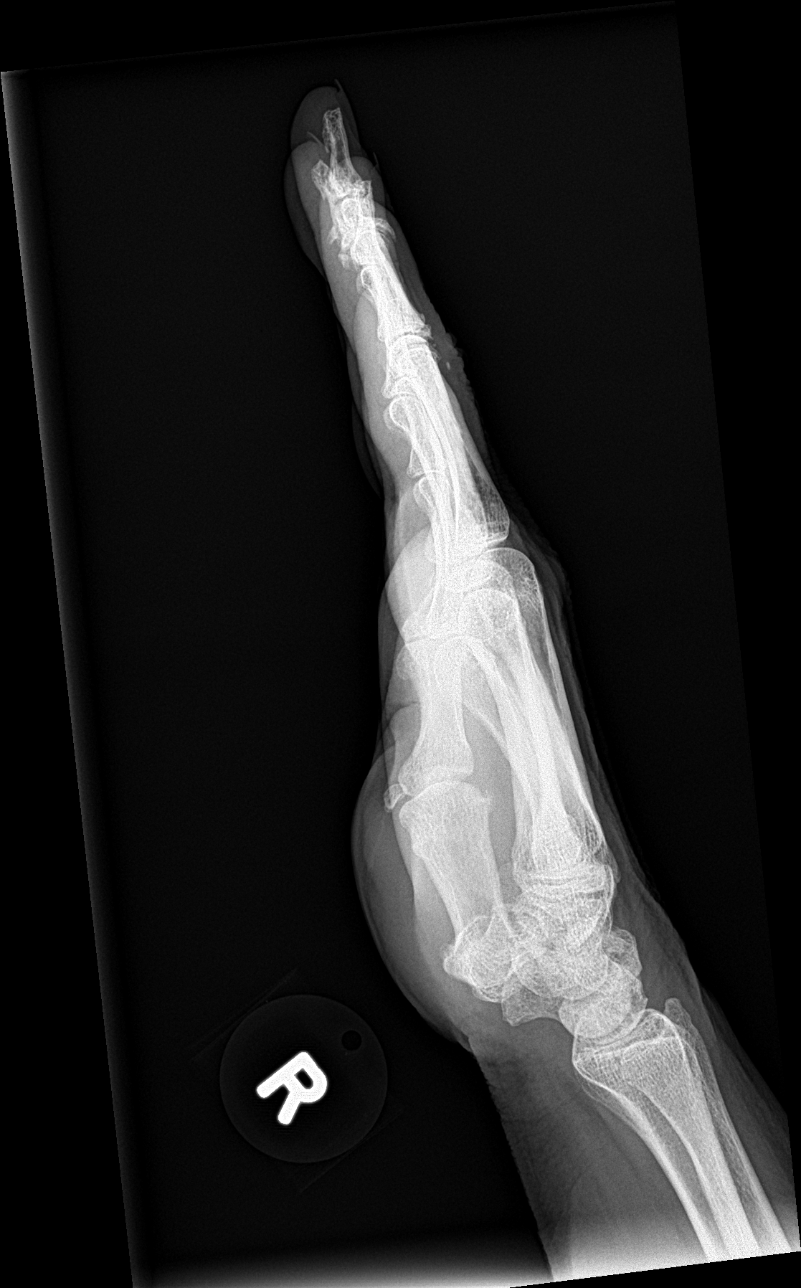
[im 2/2]
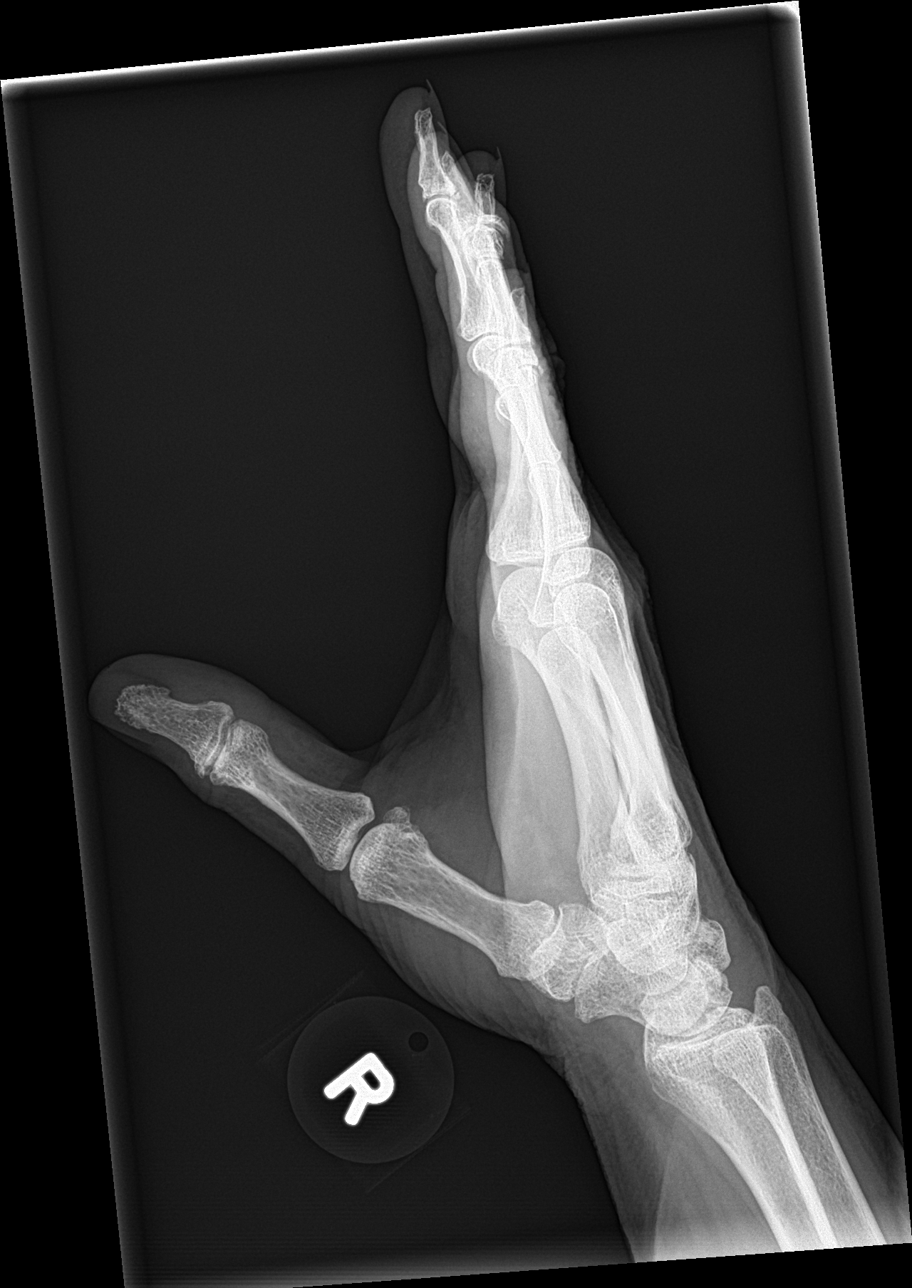

[4 of 4 positions shown; findings below may reference images not displayed]

FINDINGS: Osseous demineralization.

Joint space narrowing and irregularity at DIP joint index finger.

Remaining joint spaces fairly well preserved.

No acute fracture, dislocation, or bone destruction.

No radiopaque foreign bodies identified at the palm of the RIGHT
hand at the region of the fifth metacarpal.
IMPRESSION: No definite radiopaque foreign bodies identified.

## 2020-09-30 DIAGNOSIS — G4733 Obstructive sleep apnea (adult) (pediatric): Secondary | ICD-10-CM | POA: Diagnosis not present

## 2020-10-23 ENCOUNTER — Other Ambulatory Visit: Payer: Self-pay | Admitting: Physician Assistant

## 2020-10-23 DIAGNOSIS — F320 Major depressive disorder, single episode, mild: Secondary | ICD-10-CM

## 2020-12-14 ENCOUNTER — Other Ambulatory Visit: Payer: Self-pay | Admitting: Physician Assistant

## 2020-12-14 DIAGNOSIS — I10 Essential (primary) hypertension: Secondary | ICD-10-CM

## 2020-12-25 ENCOUNTER — Telehealth: Payer: Self-pay | Admitting: Physician Assistant

## 2020-12-25 DIAGNOSIS — I10 Essential (primary) hypertension: Secondary | ICD-10-CM

## 2020-12-25 MED ORDER — TRIAMTERENE-HCTZ 37.5-25 MG PO TABS: 1.0000 | ORAL_TABLET | Freq: Every day | ORAL | 1 refills | Status: DC

## 2020-12-25 NOTE — Telephone Encounter (Signed)
Medication Refill - Medication: triamterene-hydrochlorothiazide (MAXZIDE-25) 37.5-25 MG tablet   Has the patient contacted their pharmacy?: Yes, Pharmacy told pt that they have been trying to fax over a request for her, but have not got a response.   Preferred Pharmacy (with phone number or street name): [place], [place]  Phone: [phone] Fax: [phone]  Agent: Please be advised that RX refills may take up to 3 business days. We ask that you follow-up with your pharmacy.

## 2020-12-31 DIAGNOSIS — G4733 Obstructive sleep apnea (adult) (pediatric): Secondary | ICD-10-CM | POA: Diagnosis not present

## 2021-01-07 ENCOUNTER — Other Ambulatory Visit: Payer: Self-pay | Admitting: Physician Assistant

## 2021-01-07 DIAGNOSIS — F320 Major depressive disorder, single episode, mild: Secondary | ICD-10-CM

## 2021-03-08 ENCOUNTER — Other Ambulatory Visit: Payer: Self-pay | Admitting: Physician Assistant

## 2021-03-08 DIAGNOSIS — F320 Major depressive disorder, single episode, mild: Secondary | ICD-10-CM

## 2021-04-01 ENCOUNTER — Ambulatory Visit (INDEPENDENT_AMBULATORY_CARE_PROVIDER_SITE_OTHER): Payer: Medicare HMO | Admitting: Family Medicine

## 2021-04-01 ENCOUNTER — Other Ambulatory Visit: Payer: Self-pay

## 2021-04-01 ENCOUNTER — Encounter: Payer: Self-pay | Admitting: Family Medicine

## 2021-04-01 VITALS — BP 144/83 | HR 83 | Temp 98.2°F | Resp 16 | Ht 66.0 in | Wt 175.1 lb

## 2021-04-01 DIAGNOSIS — E785 Hyperlipidemia, unspecified: Secondary | ICD-10-CM | POA: Diagnosis not present

## 2021-04-01 DIAGNOSIS — M25531 Pain in right wrist: Secondary | ICD-10-CM | POA: Diagnosis not present

## 2021-04-01 DIAGNOSIS — Z13820 Encounter for screening for osteoporosis: Secondary | ICD-10-CM | POA: Diagnosis not present

## 2021-04-01 DIAGNOSIS — Z0000 Encounter for general adult medical examination without abnormal findings: Secondary | ICD-10-CM | POA: Diagnosis not present

## 2021-04-01 DIAGNOSIS — I10 Essential (primary) hypertension: Secondary | ICD-10-CM

## 2021-04-01 DIAGNOSIS — Z1231 Encounter for screening mammogram for malignant neoplasm of breast: Secondary | ICD-10-CM | POA: Diagnosis not present

## 2021-04-01 DIAGNOSIS — F1010 Alcohol abuse, uncomplicated: Secondary | ICD-10-CM | POA: Diagnosis not present

## 2021-04-01 DIAGNOSIS — R739 Hyperglycemia, unspecified: Secondary | ICD-10-CM

## 2021-04-01 MED ORDER — TRIAMTERENE-HCTZ 37.5-25 MG PO TABS: 1.0000 | ORAL_TABLET | Freq: Every day | ORAL | 3 refills | Status: DC

## 2021-04-01 NOTE — Progress Notes (Signed)
Complete physical exam   Patient: Cindy Vega   DOB: 1945-12-14   75 y.o. Female  MRN: [id number] Visit Date: 04/01/2021  Today's healthcare provider: Gwyneth Sprout, FNP   Chief Complaint  Patient presents with   Annual Exam   Subjective     HPI  Cindy Vega is a 75 y.o. female who presents today for a complete physical exam.  She reports consuming a general diet. The patient does not participate in regular exercise at present. She generally feels well. She reports sleeping fairly well. She does have additional problems to discuss today. Patient would like to address osteoarthritis in right hand, she states that she has a dull achy pain that is present and states that it has been more difficult to grip items/things.   Past Medical History:  Diagnosis Date   Arthritis    fingers   Cancer ([place])    basal cell   Hypertension    PONV (postoperative nausea and vomiting)    after hysterectomy   Past Surgical History:  Procedure Laterality Date   ABDOMINAL HYSTERECTOMY     BREAST CYST EXCISION Right 1996   cyst removed-neg   CATARACT EXTRACTION     COLONOSCOPY WITH PROPOFOL N/A 03/02/2017   Procedure: COLONOSCOPY WITH PROPOFOL;  Surgeon: Lucilla Lame, MD;  Location: [facility];  Service: Endoscopy;  Laterality: N/A;   POLYPECTOMY N/A 03/02/2017   Procedure: POLYPECTOMY;  Surgeon: Lucilla Lame, MD;  Location: [place];  Service: Endoscopy;  Laterality: N/A;   Social History   Socioeconomic History   Marital status: Married    Spouse name: Not on file   Number of children: 2   Years of education: Not on file   Highest education level: Bachelor's degree (e.g., BA, AB, BS)  Occupational History   Occupation: retired  Tobacco Use   Smoking status: Never   Smokeless tobacco: Never  Vaping Use   Vaping Use: Never used  Substance and Sexual Activity   Alcohol use: Yes    Alcohol/week: 3.0 standard drinks    Types: 3 Glasses of wine per week    Drug use: No   Sexual activity: Not on file  Other Topics Concern   Not on file  Social History Narrative   Not on file   Social Determinants of Health   Financial Resource Strain: Not on file  Food Insecurity: Not on file  Transportation Needs: Not on file  Physical Activity: Not on file  Stress: Not on file  Social Connections: Not on file  Intimate Partner Violence: Not on file   Family Status  Relation Name Status   Sister  Alive   Mother  Alive   Father  Deceased at age 63   Brother  Alive   Sister  20   Sister  Alive   Daughter  Alive   Son  Alive   Family History  Problem Relation Age of Onset   Breast cancer Sister 77   Hypertension Mother    Heart disease Mother    Diabetes Mother    Diabetes Father    Prostate cancer Father    Graves' disease Sister    Breast cancer Sister    Allergies  Allergen Reactions   Silver Nitrate     LIQUID NITRATE used to remove warts    Patient Care Team: Gwyneth Sprout, FNP as PCP - General (Family Medicine) Eulogio Bear, MD as Consulting Physician (Ophthalmology) Kellie Moor, Dayle Points,  MD (Dermatology)   Medications: Outpatient Medications Prior to Visit  Medication Sig Note   aspirin 81 MG tablet Take by mouth every other day.     Biotin 5 MG CAPS daily.     cholecalciferol (VITAMIN D) 1000 units tablet Take 1,000 Units by mouth daily.     latanoprost (XALATAN) 0.005 % ophthalmic solution 1 drop at bedtime.    MULTIPLE VITAMIN PO Take by mouth daily.     Omega-3 Fatty Acids (FISH OIL) 1000 MG CAPS daily.     psyllium (METAMUCIL) 58.6 % packet Take 1 packet by mouth daily.    [DISCONTINUED] FLUoxetine (PROZAC) 10 MG capsule TAKE 1 CAPSULE EVERY DAY 04/01/2021: wants to stop- started for menopause s/s   [DISCONTINUED] triamterene-hydrochlorothiazide (MAXZIDE-25) 37.5-25 MG tablet Take 1 tablet by mouth daily.    No facility-administered medications prior to visit.    Review of Systems  Musculoskeletal:   Positive for arthralgias.  All other systems reviewed and are negative.    Objective    BP (!) 144/83   Pulse 83   Temp 98.2 F (36.8 C) (Oral)   Resp 16   Ht '5\' 6"'$  (1.676 m)   Wt 175 lb 1.6 oz (79.4 kg)   SpO2 99%   BMI 28.26 kg/m    Physical Exam Vitals and nursing note reviewed.  Constitutional:      General: She is awake. She is not in acute distress.    Appearance: Normal appearance. She is well-developed, well-groomed and overweight. She is not ill-appearing, toxic-appearing or diaphoretic.  HENT:     Head: Normocephalic and atraumatic.     Jaw: There is normal jaw occlusion. No trismus, tenderness, swelling or pain on movement.     Right Ear: Hearing, tympanic membrane, ear canal and external ear normal. There is no impacted cerumen.     Left Ear: Hearing, tympanic membrane, ear canal and external ear normal. There is no impacted cerumen.     Nose: Nose normal. No congestion or rhinorrhea.     Right Turbinates: Not enlarged, swollen or pale.     Left Turbinates: Not enlarged, swollen or pale.     Right Sinus: No maxillary sinus tenderness or frontal sinus tenderness.     Left Sinus: No maxillary sinus tenderness or frontal sinus tenderness.     Mouth/Throat:     Lips: Pink.     Mouth: Mucous membranes are moist. No injury.     Tongue: No lesions.     Pharynx: Oropharynx is clear. Uvula midline. No pharyngeal swelling, oropharyngeal exudate, posterior oropharyngeal erythema or uvula swelling.     Tonsils: No tonsillar exudate or tonsillar abscesses.  Eyes:     General: Lids are normal. Lids are everted, no foreign bodies appreciated. Vision grossly intact. Gaze aligned appropriately. No allergic shiner or visual field deficit.       Right eye: No discharge.        Left eye: No discharge.     Extraocular Movements: Extraocular movements intact.     Conjunctiva/sclera: Conjunctivae normal.     Right eye: Right conjunctiva is not injected. No exudate.    Left eye:  Left conjunctiva is not injected. No exudate.    Pupils: Pupils are equal, round, and reactive to light.  Neck:     Thyroid: No thyroid mass, thyromegaly or thyroid tenderness.     Vascular: No carotid bruit.     Trachea: Trachea normal.  Cardiovascular:     Rate and Rhythm: Normal  rate and regular rhythm.     Pulses: Normal pulses.          Carotid pulses are 2+ on the right side and 2+ on the left side.      Radial pulses are 2+ on the right side and 2+ on the left side.       Dorsalis pedis pulses are 2+ on the right side and 2+ on the left side.       Posterior tibial pulses are 2+ on the right side and 2+ on the left side.     Heart sounds: Normal heart sounds, S1 normal and S2 normal. No murmur heard.   No friction rub. No gallop.  Pulmonary:     Effort: Pulmonary effort is normal. No respiratory distress.     Breath sounds: Normal breath sounds and air entry. No stridor. No wheezing, rhonchi or rales.  Chest:     Chest wall: No tenderness.     Comments: Breast exam deferred; discussed 'know your lemons' campaign and self exam Abdominal:     General: Abdomen is flat. Bowel sounds are normal. There is no distension.     Palpations: Abdomen is soft. There is no mass.     Tenderness: There is no abdominal tenderness. There is no right CVA tenderness, left CVA tenderness, guarding or rebound.     Hernia: No hernia is present.  Genitourinary:    Comments: Exam deferred; denies complaints Musculoskeletal:        General: No swelling, tenderness, deformity or signs of injury.     Right wrist: Decreased range of motion.     Cervical back: Full passive range of motion without pain, normal range of motion and neck supple. No edema, rigidity or tenderness. No muscular tenderness.     Right lower leg: No edema.     Left lower leg: No edema.  Lymphadenopathy:     Cervical: No cervical adenopathy.     Right cervical: No superficial, deep or posterior cervical adenopathy.    Left  cervical: No superficial, deep or posterior cervical adenopathy.  Skin:    General: Skin is warm and dry.     Capillary Refill: Capillary refill takes less than 2 seconds.     Coloration: Skin is not jaundiced or pale.     Findings: No bruising, erythema, lesion or rash.  Neurological:     General: No focal deficit present.     Mental Status: She is alert and oriented to person, place, and time. Mental status is at baseline.     GCS: GCS eye subscore is 4. GCS verbal subscore is 5. GCS motor subscore is 6.     Sensory: Sensation is intact. No sensory deficit.     Motor: Motor function is intact. No weakness.     Coordination: Coordination is intact. Coordination normal.     Gait: Gait is intact. Gait normal.  Psychiatric:        Attention and Perception: Attention and perception normal.        Mood and Affect: Mood and affect normal.        Speech: Speech normal.        Behavior: Behavior normal. Behavior is cooperative.        Thought Content: Thought content normal.        Cognition and Memory: Cognition and memory normal.        Judgment: Judgment normal.    Last depression screening scores PHQ 2/9 Scores 04/01/2021 03/31/2020 02/17/2020  PHQ - 2 Score 0 0 0  PHQ- 9 Score 0 0 -   Last fall risk screening Fall Risk  04/01/2021  Falls in the past year? 0  Number falls in past yr: 0  Injury with Fall? 0   Last Audit-C alcohol use screening Alcohol Use Disorder Test (AUDIT) 04/01/2021  1. How often do you have a drink containing alcohol? 3  2. How many drinks containing alcohol do you have on a typical day when you are drinking? 0  3. How often do you have six or more drinks on one occasion? 0  AUDIT-C Score 3  4. How often during the last year have you found that you were not able to stop drinking once you had started? -  5. How often during the last year have you failed to do what was normally expected from you because of drinking? -  6. How often during the last year have you  needed a first drink in the morning to get yourself going after a heavy drinking session? -  7. How often during the last year have you had a feeling of guilt of remorse after drinking? -  8. How often during the last year have you been unable to remember what happened the night before because you had been drinking? -  9. Have you or someone else been injured as a result of your drinking? -  10. Has a relative or friend or a doctor or another health worker been concerned about your drinking or suggested you cut down? -  Alcohol Use Disorder Identification Test Final Score (AUDIT) -  Alcohol Brief Interventions/Follow-up -   A score of 3 or more in women, and 4 or more in men indicates increased risk for alcohol abuse, EXCEPT if all of the points are from question 1   No results found for any visits on 04/01/21.  Assessment & Plan    Routine Health Maintenance and Physical Exam  Exercise Activities and Dietary recommendations  Goals      DIET - INCREASE WATER INTAKE     Recommend increasing water intake to 6-8 glasses a day.      Exercise 3x per week (30 min per time)     Recommend to start exercising 3 days a week for at least 30 minutes at a time.        Immunization History  Administered Date(s) Administered   Fluad Quad(high Dose 65+) 03/31/2020   Influenza, High Dose Seasonal PF 05/13/2014, 04/01/2016, 05/03/2017   Influenza,inj,Quad PF,6+ Mos 05/08/2013   Influenza-Unspecified 05/26/2015, 04/17/2018   Moderna Sars-Covid-2 Vaccination 08/30/2019, 10/01/2019   Pneumococcal Conjugate-13 05/13/2014   Pneumococcal Polysaccharide-23 05/08/2013   Td 04/25/2003   Tdap 08/18/2010   Zoster Recombinat (Shingrix) 02/20/2018, 06/01/2018, 06/25/2018   Zoster, Live 06/02/2008    Health Maintenance  Topic Date Due   COVID-19 Vaccine (3 - Booster for Moderna series) 03/02/2020   TETANUS/TDAP  08/18/2020   INFLUENZA VACCINE  02/22/2021   DEXA SCAN  02/24/2021   COLONOSCOPY (Pts  45-85yr Insurance coverage will need to be confirmed)  03/02/2022   MAMMOGRAM  03/12/2022   Hepatitis C Screening  Completed   PNA vac Low Risk Adult  Completed   Zoster Vaccines- Shingrix  Completed   HPV VACCINES  Aged Out    Discussed health benefits of physical activity, and encouraged her to engage in regular exercise appropriate for her age and condition.  Problem List Items Addressed This Visit  Cardiovascular and Mediastinum   Essential hypertension    BP has increased today from last visit Weight has also increased Pt denies CP, SOB Slight lower extremity edema      Relevant Medications   triamterene-hydrochlorothiazide (MAXZIDE-25) 37.5-25 MG tablet     Other   Blood glucose elevated    A1c tested today      Wrist pain, acute, right    Refused trial of NSAIDs Has been seen by Ortho previously for trigger finger and splinting      Relevant Orders   Ambulatory referral to Sports Medicine   Visit for preventive health examination - Primary    Things to do to keep yourself healthy  - Exercise at least 30-45 minutes a day, 3-4 days a week.  - Eat a low-fat diet with lots of fruits and vegetables, up to 7-9 servings per day.  - Seatbelts can save your life. Wear them always.  - Smoke detectors on every level of your home, check batteries every year.  - Eye Doctor - have an eye exam every 1-2 years  - Safe sex - if you may be exposed to STDs, use a condom.  - Alcohol -  If you drink, do it moderately, less than 2 drinks per day.  - [place]. Choose someone to speak for you if you are not able.  - Depression is common in our stressful world.If you're feeling down or losing interest in things you normally enjoy, please come in for a visit.  - Violence - If anyone is threatening or hurting you, please call immediately.        Relevant Orders   CBC with Differential/Platelet   Comprehensive metabolic panel   Lipid panel   TSH   T4,  free   Hemoglobin A1c   Breast cancer screening by mammogram    Due for routine screening Family hx      Relevant Orders   MM 3D SCREEN BREAST BILATERAL   Encounter for screening for osteoporosis    Due for DEXA screening; last done 5 years ago      Relevant Orders   DG Bone Density   Alcohol abuse    Frequent alcohol use; does not wish to discuss        Return in about 1 year (around 04/01/2022) for annual examination.    Vonna Kotyk, FNP, have reviewed all documentation for this visit. The documentation on 04/01/21 for the exam, diagnosis, procedures, and orders are all accurate and complete.    Gwyneth Sprout, [place] [phone] (phone) [phone] (fax)  [place]

## 2021-04-01 NOTE — Assessment & Plan Note (Signed)
Frequent alcohol use; does not wish to discuss

## 2021-04-01 NOTE — Assessment & Plan Note (Signed)
Due for DEXA screening; last done 5 years ago

## 2021-04-01 NOTE — Assessment & Plan Note (Signed)
Refused trial of NSAIDs Has been seen by Ortho previously for trigger finger and splinting

## 2021-04-01 NOTE — Assessment & Plan Note (Signed)
BP has increased today from last visit Weight has also increased Pt denies CP, SOB Slight lower extremity edema

## 2021-04-01 NOTE — Assessment & Plan Note (Signed)
Due for routine screening Family hx

## 2021-04-01 NOTE — Assessment & Plan Note (Signed)

## 2021-04-01 NOTE — Assessment & Plan Note (Signed)
A1c tested today

## 2021-04-02 ENCOUNTER — Encounter: Payer: Self-pay | Admitting: Family Medicine

## 2021-04-02 ENCOUNTER — Other Ambulatory Visit: Payer: Self-pay | Admitting: Family Medicine

## 2021-04-02 DIAGNOSIS — G4733 Obstructive sleep apnea (adult) (pediatric): Secondary | ICD-10-CM | POA: Diagnosis not present

## 2021-04-02 DIAGNOSIS — E7800 Pure hypercholesterolemia, unspecified: Secondary | ICD-10-CM

## 2021-04-02 LAB — CBC WITH DIFFERENTIAL/PLATELET
Basophils Absolute: 0 10*3/uL (ref 0.0–0.2)
Basos: 1 %
EOS (ABSOLUTE): 0.2 10*3/uL (ref 0.0–0.4)
Eos: 3 %
Hematocrit: 41.2 % (ref 34.0–46.6)
Hemoglobin: 14.3 g/dL (ref 11.1–15.9)
Immature Grans (Abs): 0 10*3/uL (ref 0.0–0.1)
Immature Granulocytes: 0 %
Lymphocytes Absolute: 2.4 10*3/uL (ref 0.7–3.1)
Lymphs: 29 %
MCH: 33 pg (ref 26.6–33.0)
MCHC: 34.7 g/dL (ref 31.5–35.7)
MCV: 95 fL (ref 79–97)
Monocytes Absolute: 0.7 10*3/uL (ref 0.1–0.9)
Monocytes: 9 %
Neutrophils Absolute: 4.8 10*3/uL (ref 1.4–7.0)
Neutrophils: 58 %
Platelets: 282 10*3/uL (ref 150–450)
RBC: 4.33 x10E6/uL (ref 3.77–5.28)
RDW: 13.1 % (ref 11.7–15.4)
WBC: 8.2 10*3/uL (ref 3.4–10.8)

## 2021-04-02 LAB — COMPREHENSIVE METABOLIC PANEL
ALT: 32 IU/L (ref 0–32)
AST: 25 IU/L (ref 0–40)
Albumin/Globulin Ratio: 2 (ref 1.2–2.2)
Albumin: 4.8 g/dL — ABNORMAL HIGH (ref 3.7–4.7)
Alkaline Phosphatase: 56 IU/L (ref 44–121)
BUN/Creatinine Ratio: 20 (ref 12–28)
BUN: 22 mg/dL (ref 8–27)
Bilirubin Total: 0.3 mg/dL (ref 0.0–1.2)
CO2: 24 mmol/L (ref 20–29)
Calcium: 10.1 mg/dL (ref 8.7–10.3)
Chloride: 96 mmol/L (ref 96–106)
Creatinine, Ser: 1.1 mg/dL — ABNORMAL HIGH (ref 0.57–1.00)
Globulin, Total: 2.4 g/dL (ref 1.5–4.5)
Glucose: 117 mg/dL — ABNORMAL HIGH (ref 65–99)
Potassium: 4.1 mmol/L (ref 3.5–5.2)
Sodium: 135 mmol/L (ref 134–144)
Total Protein: 7.2 g/dL (ref 6.0–8.5)
eGFR: 53 mL/min/{1.73_m2} — ABNORMAL LOW (ref >59)

## 2021-04-02 LAB — LIPID PANEL
Chol/HDL Ratio: 3.2 ratio (ref 0.0–4.4)
Cholesterol, Total: 246 mg/dL — ABNORMAL HIGH (ref 100–199)
HDL: 77 mg/dL (ref >39)
LDL Chol Calc (NIH): 117 mg/dL — ABNORMAL HIGH (ref 0–99)
Triglycerides: 304 mg/dL — ABNORMAL HIGH (ref 0–149)
VLDL Cholesterol Cal: 52 mg/dL — ABNORMAL HIGH (ref 5–40)

## 2021-04-02 LAB — HEMOGLOBIN A1C
Est. average glucose Bld gHb Est-mCnc: 123 mg/dL
Hgb A1c MFr Bld: 5.9 % — ABNORMAL HIGH (ref 4.8–5.6)

## 2021-04-02 LAB — TSH: TSH: 2.06 u[IU]/mL (ref 0.450–4.500)

## 2021-04-02 LAB — T4, FREE: Free T4: 1.33 ng/dL (ref 0.82–1.77)

## 2021-04-02 MED ORDER — ATORVASTATIN CALCIUM 20 MG PO TABS: 20.0000 mg | ORAL_TABLET | Freq: Every day | ORAL | 3 refills | Status: DC

## 2021-04-06 ENCOUNTER — Ambulatory Visit: Payer: Medicare HMO | Admitting: Sports Medicine

## 2021-04-13 ENCOUNTER — Telehealth: Payer: Self-pay

## 2021-04-13 NOTE — Telephone Encounter (Signed)
Copied from [place] [phone]. Topic: Referral - Request for Referral >> Apr 13, 2021 11:57 AM Cindy Vega wrote: 04/01/2021 needs a retroactive referral for Cindy Vega, with a corrected claim for that date. They say the patient is under the care of Cindy Vega (?) [employer] called stating that without referral to see Cindy Vega she is treated as a specialist and billed differently. They need referral and corrected claim.  Best contact: [phone]  [facility]

## 2021-04-14 DIAGNOSIS — Z85828 Personal history of other malignant neoplasm of skin: Secondary | ICD-10-CM | POA: Diagnosis not present

## 2021-04-14 DIAGNOSIS — L821 Other seborrheic keratosis: Secondary | ICD-10-CM | POA: Diagnosis not present

## 2021-04-14 DIAGNOSIS — D2261 Melanocytic nevi of right upper limb, including shoulder: Secondary | ICD-10-CM | POA: Diagnosis not present

## 2021-04-14 DIAGNOSIS — Z08 Encounter for follow-up examination after completed treatment for malignant neoplasm: Secondary | ICD-10-CM | POA: Diagnosis not present

## 2021-04-19 ENCOUNTER — Ambulatory Visit
Admission: RE | Admit: 2021-04-19 | Discharge: 2021-04-19 | Disposition: A | Discharge status: Home / Self Care | Payer: Medicare HMO | Source: Ambulatory Visit | Attending: Family Medicine | Admitting: Family Medicine

## 2021-04-19 ENCOUNTER — Other Ambulatory Visit: Payer: Self-pay

## 2021-04-19 DIAGNOSIS — Z1231 Encounter for screening mammogram for malignant neoplasm of breast: Secondary | ICD-10-CM | POA: Diagnosis not present

## 2021-04-19 DIAGNOSIS — M8580 Other specified disorders of bone density and structure, unspecified site: Secondary | ICD-10-CM | POA: Insufficient documentation

## 2021-04-19 DIAGNOSIS — Z13820 Encounter for screening for osteoporosis: Secondary | ICD-10-CM | POA: Diagnosis present

## 2021-04-19 DIAGNOSIS — Z90710 Acquired absence of both cervix and uterus: Secondary | ICD-10-CM | POA: Diagnosis not present

## 2021-04-19 DIAGNOSIS — Z780 Asymptomatic menopausal state: Secondary | ICD-10-CM | POA: Diagnosis not present

## 2021-04-19 DIAGNOSIS — R928 Other abnormal and inconclusive findings on diagnostic imaging of breast: Secondary | ICD-10-CM | POA: Diagnosis not present

## 2021-04-19 DIAGNOSIS — M85852 Other specified disorders of bone density and structure, left thigh: Secondary | ICD-10-CM | POA: Diagnosis not present

## 2021-04-20 ENCOUNTER — Ambulatory Visit (INDEPENDENT_AMBULATORY_CARE_PROVIDER_SITE_OTHER): Payer: Medicare HMO

## 2021-04-20 DIAGNOSIS — Z0000 Encounter for general adult medical examination without abnormal findings: Secondary | ICD-10-CM | POA: Diagnosis not present

## 2021-04-20 NOTE — Progress Notes (Signed)
Subjective:   Cindy Vega is a 75 y.o. female who presents for Medicare Annual (Subsequent) preventive examination. I connected with  ANVI MANGAL on 04/20/21 by a video enabled telemedicine application and verified that I am speaking with the correct person using two identifiers.   I discussed the limitations of evaluation and management by telemedicine. The patient expressed understanding and agreed to proceed.   Location of patient: Home Location of provider:  Home   Review of Systems    Defer to PCP       Objective:    There were no vitals filed for this visit. There is no height or weight on file to calculate BMI.  Advanced Directives 02/17/2020 01/19/2020 02/13/2019 02/07/2018 01/28/2018 03/02/2017 12/05/2016  Does Patient Have a Medical Advance Directive? Yes No Yes Yes Yes Yes No  Type of Paramedic of [place];Living will - Healthcare Power of [place] - -  Does patient want to make changes to medical advance directive? - - - - - Yes (MAU/Ambulatory/Procedural Areas - Information given) -  Copy of [place] in Chart? No - copy requested - No - copy requested No - copy requested - - -    Current Medications (verified) Outpatient Encounter Medications as of 04/20/2021  Medication Sig   aspirin 81 MG tablet Take by mouth every other day.    atorvastatin (LIPITOR) 20 MG tablet Take 1 tablet (20 mg total) by mouth daily.   Biotin 5 MG CAPS daily.    cholecalciferol (VITAMIN D) 1000 units tablet Take 1,000 Units by mouth daily.    latanoprost (XALATAN) 0.005 % ophthalmic solution 1 drop at bedtime.   MULTIPLE VITAMIN PO Take by mouth daily.    Omega-3 Fatty Acids (FISH OIL) 1000 MG CAPS daily.    psyllium (METAMUCIL) 58.6 % packet Take 1 packet by mouth daily.   triamterene-hydrochlorothiazide (MAXZIDE-25) 37.5-25 MG tablet Take 1 tablet by mouth daily.   No  facility-administered encounter medications on file as of 04/20/2021.    Allergies (verified) Silver nitrate   History: Past Medical History:  Diagnosis Date   Arthritis    fingers   Cancer ([place])    basal cell   Hypertension    PONV (postoperative nausea and vomiting)    after hysterectomy   Past Surgical History:  Procedure Laterality Date   ABDOMINAL HYSTERECTOMY     BREAST CYST EXCISION Right 1996   cyst removed-neg   CATARACT EXTRACTION     COLONOSCOPY WITH PROPOFOL N/A 03/02/2017   Procedure: COLONOSCOPY WITH PROPOFOL;  Surgeon: Lucilla Lame, MD;  Location: [facility];  Service: Endoscopy;  Laterality: N/A;   POLYPECTOMY N/A 03/02/2017   Procedure: POLYPECTOMY;  Surgeon: Lucilla Lame, MD;  Location: [place];  Service: Endoscopy;  Laterality: N/A;   Family History  Problem Relation Age of Onset   Hypertension Mother    Heart disease Mother    Diabetes Mother    Diabetes Father    Prostate cancer Father    Breast cancer Sister 66   Graves' disease Sister    Breast cancer Sister    Social History   Socioeconomic History   Marital status: Married    Spouse name: Not on file   Number of children: 2   Years of education: Not on file   Highest education level: Bachelor's degree (e.g., BA, AB, BS)  Occupational History   Occupation: retired  Tobacco  Use   Smoking status: Never   Smokeless tobacco: Never  Vaping Use   Vaping Use: Never used  Substance and Sexual Activity   Alcohol use: Yes    Alcohol/week: 3.0 standard drinks    Types: 3 Glasses of wine per week   Drug use: No   Sexual activity: Not on file  Other Topics Concern   Not on file  Social History Narrative   Not on file   Social Determinants of Health   Financial Resource Strain: Not on file  Food Insecurity: Not on file  Transportation Needs: Not on file  Physical Activity: Not on file  Stress: Not on file  Social Connections: Not on file    Tobacco  Counseling Counseling given: Not Answered   Clinical Intake:                 Diabetic No         Activities of Daily Living In your present state of health, do you have any difficulty performing the following activities: 04/01/2021  Hearing? N  Vision? N  Difficulty concentrating or making decisions? N  Walking or climbing stairs? N  Dressing or bathing? N  Doing errands, shopping? N  Some recent data might be hidden    Patient Care Team: Gwyneth Sprout, FNP as PCP - General (Family Medicine) Eulogio Bear, MD as Consulting Physician (Ophthalmology) Oneta Rack, MD (Dermatology)  Indicate any recent Medical Services you may have received from other than [facility] providers in the past year (date may be approximate).     Assessment:   This is a routine wellness examination for Cindy Vega.  Hearing/Vision screen No results found.  Dietary issues and exercise activities discussed:     Goals Addressed   None   Depression Screen PHQ 2/9 Scores 04/01/2021 03/31/2020 02/17/2020 02/13/2019 02/07/2018 02/07/2018 12/05/2016  PHQ - 2 Score 0 0 0 0 0 0 0  PHQ- 9 Score 0 0 - - 0 - 1    Fall Risk Fall Risk  04/01/2021 02/17/2020 02/13/2019 02/07/2018 12/05/2016  Falls in the past year? 0 0 0 No No  Number falls in past yr: 0 0 - - -  Injury with Fall? 0 0 - - -    FALL RISK PREVENTION PERTAINING TO THE HOME:  Any stairs in or around the home? Yes  If so, are there any without handrails? No  Home free of loose throw rugs in walkways, pet beds, electrical cords, etc? Yes  Adequate lighting in your home to reduce risk of falls? Yes   ASSISTIVE DEVICES UTILIZED TO PREVENT FALLS:  Life alert? No  Use of a cane, walker or w/c? No  Grab bars in the bathroom? Yes  Shower chair or bench in shower? No  Elevated toilet seat or a handicapped toilet? Yes   TIMED UP AND GO:  Was the test performed?  n/a .  Length of time to ambulate 10 feet: n/a sec.     Cognitive  Function:        Immunizations Immunization History  Administered Date(s) Administered   Fluad Quad(high Dose 65+) 03/31/2020   Influenza, High Dose Seasonal PF 05/13/2014, 04/01/2016, 05/03/2017   Influenza,inj,Quad PF,6+ Mos 05/08/2013   Influenza-Unspecified 05/26/2015, 04/17/2018   Moderna Sars-Covid-2 Vaccination 08/30/2019, 10/01/2019   Pneumococcal Conjugate-13 05/13/2014   Pneumococcal Polysaccharide-23 05/08/2013   Td 04/25/2003   Tdap 08/18/2010   Zoster Recombinat (Shingrix) 02/20/2018, 06/01/2018, 06/25/2018   Zoster, Live 06/02/2008  TDAP status: Up to date  Flu Vaccine status: Up to date  Pneumococcal vaccine status: Up to date  Covid-19 vaccine status: Information provided on how to obtain vaccines.   Qualifies for Shingles Vaccine? Yes   Zostavax completed Yes   Shingrix Completed?: Yes  Screening Tests Health Maintenance  Topic Date Due   COVID-19 Vaccine (3 - Booster for Moderna series) 03/02/2020   TETANUS/TDAP  08/18/2020   INFLUENZA VACCINE  02/22/2021   COLONOSCOPY (Pts 45-78yrs Insurance coverage will need to be confirmed)  03/02/2022   MAMMOGRAM  03/12/2022   DEXA SCAN  04/19/2026   Hepatitis C Screening  Completed   Zoster Vaccines- Shingrix  Completed   HPV VACCINES  Aged Out    Health Maintenance  Health Maintenance Due  Topic Date Due   COVID-19 Vaccine (3 - Booster for Moderna series) 03/02/2020   TETANUS/TDAP  08/18/2020   INFLUENZA VACCINE  02/22/2021    Colorectal cancer screening: Type of screening: Colonoscopy. Completed 03/02/2017. Repeat every 5 years  Mammogram status: Completed 04/19/2021. Repeat every year  Bone Density status: Completed 03/30/2021. Results reflect: Bone density results: OSTEOPOROSIS. Repeat every 5 years.  Lung Cancer Screening: (Low Dose CT Chest recommended if Age 35-80 years, 30 pack-year currently smoking OR have quit w/in 15years.) does not qualify.   Lung Cancer Screening Referral:  n/a  Additional Screening:  Hepatitis C Screening: does qualify; Completed 10/25/2013  Vision Screening: Recommended annual ophthalmology exams for early detection of glaucoma and other disorders of the eye. Is the patient up to date with their annual eye exam?  Yes  Who is the provider or what is the name of the office in which the patient attends annual eye exams? [facility]. Dr. Edison Pace  If pt is not established with a provider, would they like to be referred to a provider to establish care?  N/a .   Dental Screening: Recommended annual dental exams for proper oral hygiene  Community Resource Referral / Chronic Care Management: CRR required this visit?  No   CCM required this visit?  No      Plan:     I have personally reviewed and noted the following in the patient's chart:   Medical and social history Use of alcohol, tobacco or illicit drugs  Current medications and supplements including opioid prescriptions.  Functional ability and status Nutritional status Physical activity Advanced directives List of other physicians Hospitalizations, surgeries, and ER visits in previous 12 months Vitals Screenings to include cognitive, depression, and falls Referrals and appointments  In addition, I have reviewed and discussed with patient certain preventive protocols, quality metrics, and best practice recommendations. A written personalized care plan for preventive services as well as general preventive health recommendations were provided to patient.     Nat Christen, CMA   04/20/2021   Nurse Notes: Non face to face 40 minute visit   Cindy Vega , Thank you for taking time to come for your Medicare Wellness Visit. I appreciate your ongoing commitment to your health goals. Please review the following plan we discussed and let me know if I can assist you in the future.   These are the goals we discussed:  Goals      DIET - INCREASE WATER INTAKE     Recommend  increasing water intake to 6-8 glasses a day.      Exercise 3x per week (30 min per time)     Recommend to start exercising 3 days a week  for at least 30 minutes at a time.        This is a list of the screening recommended for you and due dates:  Health Maintenance  Topic Date Due   COVID-19 Vaccine (3 - Booster for Moderna series) 03/02/2020   Tetanus Vaccine  08/18/2020   Flu Shot  02/22/2021   Colon Cancer Screening  03/02/2022   Mammogram  03/12/2022   DEXA scan (bone density measurement)  04/19/2026   Hepatitis C Screening: USPSTF Recommendation to screen - Ages 18-79 yo.  Completed   Zoster (Shingles) Vaccine  Completed   HPV Vaccine  Aged Out

## 2021-04-26 ENCOUNTER — Other Ambulatory Visit: Payer: Self-pay | Admitting: Family Medicine

## 2021-04-26 DIAGNOSIS — N6489 Other specified disorders of breast: Secondary | ICD-10-CM

## 2021-04-26 DIAGNOSIS — R928 Other abnormal and inconclusive findings on diagnostic imaging of breast: Secondary | ICD-10-CM

## 2021-05-10 ENCOUNTER — Ambulatory Visit
Admission: RE | Admit: 2021-05-10 | Discharge: 2021-05-10 | Disposition: A | Discharge status: Home / Self Care | Payer: Medicare HMO | Source: Ambulatory Visit | Attending: Family Medicine | Admitting: Family Medicine

## 2021-05-10 ENCOUNTER — Other Ambulatory Visit: Payer: Self-pay

## 2021-05-10 DIAGNOSIS — N6489 Other specified disorders of breast: Secondary | ICD-10-CM | POA: Diagnosis not present

## 2021-05-10 DIAGNOSIS — R928 Other abnormal and inconclusive findings on diagnostic imaging of breast: Secondary | ICD-10-CM | POA: Diagnosis not present

## 2021-05-10 DIAGNOSIS — R922 Inconclusive mammogram: Secondary | ICD-10-CM | POA: Diagnosis not present

## 2021-05-25 ENCOUNTER — Telehealth: Payer: Self-pay

## 2021-05-25 NOTE — Telephone Encounter (Signed)
Copied from [place] [phone] [id number]. Topic: Complaint - Billing/Coding >> May 25, 2021  3:20 PM Yvette Rack wrote: DOS: 04/01/21 Details of complaint: Pt stated she was informed by her insurance that the coding was incorrect and it should have been coded under Dr. Lelon Huh instead of Tally Joe How would the patient like to see this issue resolved? Pt request that the coding be corrected and resubmitted through her insurance   Route to Engineer, building services.

## 2021-05-26 ENCOUNTER — Telehealth: Payer: Self-pay

## 2021-05-26 NOTE — Telephone Encounter (Signed)
Copied from [place] [id number]. Topic: Complaint - Billing/Coding >> May 25, 2021  3:20 PM Yvette Rack wrote: DOS: 04/01/21 Details of complaint: Pt stated she was informed by her insurance that the coding was incorrect and it should have been coded under Dr. Lelon Huh instead of Tally Joe How would the patient like to see this issue resolved? Pt request that the coding be corrected and resubmitted through her insurance   Route to Engineer, building services.

## 2021-06-22 ENCOUNTER — Encounter: Payer: Medicare HMO | Admitting: Family Medicine

## 2021-06-25 NOTE — Telephone Encounter (Signed)
Patient called in again about the bill she received from [facility] for $325 for DOS 04/01/21. The rep at the insurance company told patient that they  need to have either a referral from Dr Caryn Section referring patient to Tally Joe or have someone call [employer] and give Tally Joe NPI# so that patient will not have to pay this bill or be sent to collections. Please call [facility]  Ph# [phone]

## 2021-06-29 NOTE — Telephone Encounter (Signed)
Patient called in again about the bill she received from [facility] for $325 for DOS 04/01/21. The rep at the insurance company told patient that they  need to have either a referral from Dr Caryn Section referring patient to Tally Joe or have someone call [employer] and give Tally Joe NPI# so that patient will not have to pay this bill or be sent to collections. Please call [facility]  Ph# [phone]

## 2021-06-29 NOTE — Telephone Encounter (Signed)
Contacted [employer] and gave them Cindy Vega information for NPI along with DOS. [employer] states that when claim was initially filed Cindy Vega was out of network and patient would have to cover all expenses for visit. Claim was ran again with NPI number and is under review. Decision will be made in 15 business days and patient will be notified. KW  Ref# [id number] Case# [id number]

## 2021-08-27 DIAGNOSIS — H40053 Ocular hypertension, bilateral: Secondary | ICD-10-CM | POA: Diagnosis not present

## 2021-10-04 ENCOUNTER — Telehealth: Payer: Self-pay | Admitting: Family Medicine

## 2021-10-04 NOTE — Telephone Encounter (Signed)
Pt needs CPAP supplies from [place]. ?Pt needs her supplies: mask, new filter, new hoses etc. ?Pt states [employer] was going to fax request. ?Pt declined appt for appt now..  She says she is not due for appt until Sept.  Pt made appt for a cpe on 04/05/2022 ?  At that time she would like to request a new machine the ResMed S11 cpap. ? ? ?Huey Romans [phone] ?

## 2021-10-10 ENCOUNTER — Other Ambulatory Visit: Payer: Self-pay | Admitting: Physician Assistant

## 2021-12-27 ENCOUNTER — Other Ambulatory Visit: Payer: Self-pay | Admitting: Family Medicine

## 2021-12-27 DIAGNOSIS — E7800 Pure hypercholesterolemia, unspecified: Secondary | ICD-10-CM

## 2022-03-05 ENCOUNTER — Other Ambulatory Visit: Payer: Self-pay | Admitting: Family Medicine

## 2022-03-05 DIAGNOSIS — I10 Essential (primary) hypertension: Secondary | ICD-10-CM

## 2022-03-07 ENCOUNTER — Other Ambulatory Visit: Payer: Self-pay | Admitting: Family Medicine

## 2022-03-07 DIAGNOSIS — Z1231 Encounter for screening mammogram for malignant neoplasm of breast: Secondary | ICD-10-CM

## 2022-03-07 NOTE — Telephone Encounter (Signed)
Requested Prescriptions  Pending Prescriptions Disp Refills  . triamterene-hydrochlorothiazide (MAXZIDE-25) 37.5-25 MG tablet [Pharmacy Med Name: TRIAMTERENE/HYDROCHLOROTHIAZIDE 37.5-25 MG Tablet] 90 tablet 0    Sig: TAKE 1 TABLET EVERY DAY     Cardiovascular: Diuretic Combos Failed - 03/05/2022  2:57 AM      Failed - K in normal range and within 180 days    Potassium  Date Value Ref Range Status  04/01/2021 4.1 3.5 - 5.2 mmol/L Final  08/07/2012 4.0 3.5 - 5.1 mmol/L Final         Failed - Na in normal range and within 180 days    Sodium  Date Value Ref Range Status  04/01/2021 135 134 - 144 mmol/L Final  08/07/2012 140 136 - 145 mmol/L Final         Failed - Cr in normal range and within 180 days    Creatinine  Date Value Ref Range Status  08/07/2012 0.98 0.60 - 1.30 mg/dL Final   Creatinine, Ser  Date Value Ref Range Status  04/01/2021 1.10 (H) 0.57 - 1.00 mg/dL Final         Failed - Last BP in normal range    BP Readings from Last 1 Encounters:  04/01/21 (!) 144/83         Failed - Valid encounter within last 6 months    Recent Outpatient Visits          11 months ago Visit for preventive health examination   [facility] Gwyneth Sprout, FNP   1 year ago Annual physical exam   [facility] [place], Clearnce Sorrel, [place]   3 years ago Annual physical exam   [facility] [place], Clearnce Sorrel, [place]   4 years ago Annual physical exam   [facility] [place], Clearnce Sorrel, [place]   5 years ago Medicare annual wellness visit, subsequent   [employer], Clearnce Sorrel, [place]      Future Appointments            In 4 weeks Gwyneth Sprout, [place], [facility]

## 2022-04-05 ENCOUNTER — Encounter: Payer: Medicare HMO | Admitting: Family Medicine

## 2022-04-11 ENCOUNTER — Encounter: Payer: Self-pay | Admitting: Family Medicine

## 2022-04-11 ENCOUNTER — Ambulatory Visit (INDEPENDENT_AMBULATORY_CARE_PROVIDER_SITE_OTHER): Payer: Medicare HMO | Admitting: Family Medicine

## 2022-04-11 VITALS — BP 131/71 | HR 82 | Temp 98.2°F | Resp 16 | Ht 66.0 in | Wt 176.0 lb

## 2022-04-11 DIAGNOSIS — Z1211 Encounter for screening for malignant neoplasm of colon: Secondary | ICD-10-CM | POA: Diagnosis not present

## 2022-04-11 DIAGNOSIS — Z23 Encounter for immunization: Secondary | ICD-10-CM

## 2022-04-11 DIAGNOSIS — Z0000 Encounter for general adult medical examination without abnormal findings: Secondary | ICD-10-CM | POA: Diagnosis not present

## 2022-04-11 DIAGNOSIS — R7303 Prediabetes: Secondary | ICD-10-CM | POA: Diagnosis not present

## 2022-04-11 DIAGNOSIS — D619 Aplastic anemia, unspecified: Secondary | ICD-10-CM | POA: Diagnosis not present

## 2022-04-11 NOTE — Assessment & Plan Note (Signed)
Chronic, stable Not on medication Continue to recommend balanced, lower carb meals. Smaller meal size, adding snacks. Choosing water as drink of choice and increasing purposeful exercise.

## 2022-04-11 NOTE — Assessment & Plan Note (Signed)
Consented; VIS made available; no immediate side effects following administration; plan to repeat annually   

## 2022-04-11 NOTE — Assessment & Plan Note (Signed)
UTD on dental and vision Upcoming appt with derm Refused colonoscopy; request to complete colo guard Things to do to keep yourself healthy  - Exercise at least 30-45 minutes a day, 3-4 days a week.  - Eat a low-fat diet with lots of fruits and vegetables, up to 7-9 servings per day.  - Seatbelts can save your life. Wear them always.  - Smoke detectors on every level of your home, check batteries every year.  - Eye Doctor - have an eye exam every 1-2 years  - Safe sex - if you may be exposed to STDs, use a condom.  - Alcohol -  If you drink, do it moderately, less than 2 drinks per day.  - [place]. Choose someone to speak for you if you are not able.  - Depression is common in our stressful world.If you're feeling down or losing interest in things you normally enjoy, please come in for a visit.  - Violence - If anyone is threatening or hurting you, please call immediately.

## 2022-04-11 NOTE — Progress Notes (Signed)
Complete physical exam   Patient: Cindy Vega   DOB: June 19, 1946   76 y.o. Female  MRN: [id number] Visit Date: 04/11/2022  Today's healthcare provider: Gwyneth Sprout, FNP  Re Introduced to nurse practitioner role and practice setting.  All questions answered.  Discussed provider/patient relationship and expectations.   I,Tiffany J Bragg,acting as a scribe for Gwyneth Sprout, FNP.,have documented all relevant documentation on the behalf of Gwyneth Sprout, FNP,as directed by  Gwyneth Sprout, FNP while in the presence of Gwyneth Sprout, FNP.   Chief Complaint  Patient presents with   Annual Exam   Subjective    Cindy Vega is a 76 y.o. female who presents today for a complete physical exam.  She reports consuming a general diet. Gym/ health club routine includes walking on track . She generally feels well. She reports sleeping well. She does not have additional problems to discuss today.  HPI   Past Medical History:  Diagnosis Date   Arthritis    fingers   Cancer ([place])    basal cell   Hypertension    PONV (postoperative nausea and vomiting)    after hysterectomy   Past Surgical History:  Procedure Laterality Date   ABDOMINAL HYSTERECTOMY     BREAST CYST EXCISION Right 1996   cyst removed-neg   CATARACT EXTRACTION     COLONOSCOPY WITH PROPOFOL N/A 03/02/2017   Procedure: COLONOSCOPY WITH PROPOFOL;  Surgeon: Lucilla Lame, MD;  Location: [facility];  Service: Endoscopy;  Laterality: N/A;   POLYPECTOMY N/A 03/02/2017   Procedure: POLYPECTOMY;  Surgeon: Lucilla Lame, MD;  Location: [facility];  Service: Endoscopy;  Laterality: N/A;   Social History   Socioeconomic History   Marital status: Married    Spouse name: Not on file   Number of children: 2   Years of education: Not on file   Highest education level: Bachelor's degree (e.g., BA, AB, BS)  Occupational History   Occupation: retired  Tobacco Use   Smoking status: Never   Smokeless  tobacco: Never  Vaping Use   Vaping Use: Never used  Substance and Sexual Activity   Alcohol use: Yes    Alcohol/week: 3.0 standard drinks of alcohol    Types: 3 Glasses of wine per week   Drug use: No   Sexual activity: Not on file  Other Topics Concern   Not on file  Social History Narrative   Not on file   Social Determinants of Health   Financial Resource Strain: Low Risk  (04/20/2021)   Overall Financial Resource Strain (CARDIA)    Difficulty of Paying Living Expenses: Not hard at all  Food Insecurity: No Food Insecurity (04/20/2021)   Hunger Vital Sign    Worried About Running Out of Food in the Last Year: Never true    Ran Out of Food in the Last Year: Never true  Transportation Needs: No Transportation Needs (04/20/2021)   PRAPARE - Hydrologist (Medical): No    Lack of Transportation (Non-Medical): No  Physical Activity: Insufficiently Active (04/20/2021)   Exercise Vital Sign    Days of Exercise per Week: 3 days    Minutes of Exercise per Session: 20 min  Stress: No Stress Concern Present (04/20/2021)   [place]    Feeling of Stress : Not at all  Social Connections: [place] (04/20/2021)   Social Connection and Isolation Panel [  NHANES]    Frequency of Communication with Friends and Family: More than three times a week    Frequency of Social Gatherings with Friends and Family: More than three times a week    Attends Religious Services: More than 4 times per year    Active Member of Clubs or Organizations: Yes    Attends Archivist Meetings: More than 4 times per year    Marital Status: Married  Human resources officer Violence: Not At Risk (02/17/2020)   Humiliation, Afraid, Rape, and Kick questionnaire    Fear of Current or Ex-Partner: No    Emotionally Abused: No    Physically Abused: No    Sexually Abused: No   Family Status  Relation Name Status    Mother  Alive   Father  Deceased at age 19   Sister  Deceased   Sister  Alive   Sister  Comptroller   Daughter  Alive   Brother  35   Son  [place]   Family History  Problem Relation Age of Onset   Hypertension Mother    Heart disease Mother    Diabetes Mother    Diabetes Father    Prostate cancer Father    Breast cancer Sister 65   Graves' disease Sister    Breast cancer Sister    Allergies  Allergen Reactions   Silver Nitrate     LIQUID NITRATE used to remove warts    Patient Care Team: Gwyneth Sprout, FNP as PCP - General (Family Medicine) Eulogio Bear, MD as Consulting Physician (Ophthalmology) Oneta Rack, MD (Dermatology)   Medications: Outpatient Medications Prior to Visit  Medication Sig   aspirin 81 MG tablet Take by mouth every other day.    atorvastatin (LIPITOR) 20 MG tablet TAKE 1 TABLET EVERY DAY   Biotin 5 MG CAPS daily.    cholecalciferol (VITAMIN D) 1000 units tablet Take 1,000 Units by mouth daily.    influenza vac split quadrivalent (FLUZONE) injection    latanoprost (XALATAN) 0.005 % ophthalmic solution 1 drop at bedtime.   MULTIPLE VITAMIN PO Take by mouth daily.    Omega-3 Fatty Acids (FISH OIL) 1000 MG CAPS daily.    psyllium (METAMUCIL) 58.6 % packet Take 1 packet by mouth daily.   triamterene-hydrochlorothiazide (MAXZIDE-25) 37.5-25 MG tablet TAKE 1 TABLET EVERY DAY   [DISCONTINUED] FLUoxetine (PROZAC) 10 MG capsule Take 1 capsule (10 mg total) by mouth daily. (Patient not taking: Reported on 04/11/2022)   No facility-administered medications prior to visit.    Review of Systems   Objective     BP 131/71 (BP Location: Right Arm, Patient Position: Sitting, Cuff Size: Normal)   Pulse 82   Temp 98.2 F (36.8 C) (Oral)   Resp 16   Ht '5\' 6"'$  (1.676 m)   Wt 176 lb (79.8 kg)   SpO2 100%   BMI 28.41 kg/m   Physical Exam Vitals and nursing note reviewed.  Constitutional:      General: She is awake. She is not in acute distress.     Appearance: Normal appearance. She is well-developed, well-groomed and overweight. She is not ill-appearing, toxic-appearing or diaphoretic.  HENT:     Head: Normocephalic and atraumatic.     Jaw: There is normal jaw occlusion. No trismus, tenderness, swelling or pain on movement.     Right Ear: Hearing, tympanic membrane, ear canal and external ear normal. There is no impacted cerumen.     Left Ear: Hearing, tympanic membrane,  ear canal and external ear normal. There is no impacted cerumen.     Nose: Nose normal. No congestion or rhinorrhea.     Right Turbinates: Not enlarged, swollen or pale.     Left Turbinates: Not enlarged, swollen or pale.     Right Sinus: No maxillary sinus tenderness or frontal sinus tenderness.     Left Sinus: No maxillary sinus tenderness or frontal sinus tenderness.     Mouth/Throat:     Lips: Pink.     Mouth: Mucous membranes are moist. No injury.     Tongue: No lesions.     Pharynx: Oropharynx is clear. Uvula midline. No pharyngeal swelling, oropharyngeal exudate, posterior oropharyngeal erythema or uvula swelling.     Tonsils: No tonsillar exudate or tonsillar abscesses.  Eyes:     General: Lids are normal. Lids are everted, no foreign bodies appreciated. Vision grossly intact. Gaze aligned appropriately. No allergic shiner or visual field deficit.       Right eye: No discharge.        Left eye: No discharge.     Extraocular Movements: Extraocular movements intact.     Conjunctiva/sclera: Conjunctivae normal.     Right eye: Right conjunctiva is not injected. No exudate.    Left eye: Left conjunctiva is not injected. No exudate.    Pupils: Pupils are equal, round, and reactive to light.  Neck:     Thyroid: No thyroid mass, thyromegaly or thyroid tenderness.     Vascular: No carotid bruit.     Trachea: Trachea normal.  Cardiovascular:     Rate and Rhythm: Normal rate and regular rhythm.     Pulses: Normal pulses.          Carotid pulses are 2+ on the  right side and 2+ on the left side.      Radial pulses are 2+ on the right side and 2+ on the left side.       Dorsalis pedis pulses are 2+ on the right side and 2+ on the left side.       Posterior tibial pulses are 2+ on the right side and 2+ on the left side.     Heart sounds: Normal heart sounds, S1 normal and S2 normal. No murmur heard.    No friction rub. No gallop.  Pulmonary:     Effort: Pulmonary effort is normal. No respiratory distress.     Breath sounds: Normal breath sounds and air entry. No stridor. No wheezing, rhonchi or rales.  Chest:     Chest wall: No tenderness.     Comments: Breasts: risk and benefit of breast self-exam was discussed, not examined, patient declines to have breast exam  Abdominal:     General: Abdomen is flat. Bowel sounds are normal. There is no distension.     Palpations: Abdomen is soft. There is no mass.     Tenderness: There is no abdominal tenderness. There is no right CVA tenderness, left CVA tenderness, guarding or rebound.     Hernia: No hernia is present.  Genitourinary:    Comments: Exam deferred; denies complaints Musculoskeletal:        General: No swelling, tenderness, deformity or signs of injury. Normal range of motion.     Cervical back: Full passive range of motion without pain, normal range of motion and neck supple. No edema, rigidity or tenderness. No muscular tenderness.     Right lower leg: No edema.     Left lower leg: No edema.  Lymphadenopathy:     Cervical: No cervical adenopathy.     Right cervical: No superficial, deep or posterior cervical adenopathy.    Left cervical: No superficial, deep or posterior cervical adenopathy.  Skin:    General: Skin is warm and dry.     Capillary Refill: Capillary refill takes less than 2 seconds.     Coloration: Skin is not jaundiced or pale.     Findings: No bruising, erythema, lesion or rash.  Neurological:     General: No focal deficit present.     Mental Status: She is alert and  oriented to person, place, and time. Mental status is at baseline.     GCS: GCS eye subscore is 4. GCS verbal subscore is 5. GCS motor subscore is 6.     Sensory: Sensation is intact. No sensory deficit.     Motor: Motor function is intact. No weakness.     Coordination: Coordination is intact. Coordination normal.     Gait: Gait is intact. Gait normal.  Psychiatric:        Attention and Perception: Attention and perception normal.        Mood and Affect: Mood and affect normal.        Speech: Speech normal.        Behavior: Behavior normal. Behavior is cooperative.        Thought Content: Thought content normal.        Cognition and Memory: Cognition and memory normal.        Judgment: Judgment normal.     Last depression screening scores    04/11/2022   10:05 AM 04/20/2021    6:35 PM 04/01/2021    1:51 PM  PHQ 2/9 Scores  PHQ - 2 Score 0 0 0  PHQ- 9 Score 0 0 0   Last fall risk screening    04/11/2022   10:05 AM  [place] in the past year? 0  Number falls in past yr: 0  Injury with Fall? 0  Risk for fall due to : No Fall Risks  Follow up Falls evaluation completed   Last Audit-C alcohol use screening    04/11/2022   10:05 AM  Alcohol Use Disorder Test (AUDIT)  1. How often do you have a drink containing alcohol? 2  2. How many drinks containing alcohol do you have on a typical day when you are drinking? 0  3. How often do you have six or more drinks on one occasion? 0  AUDIT-C Score 2   A score of 3 or more in women, and 4 or more in men indicates increased risk for alcohol abuse, EXCEPT if all of the points are from question 1   No results found for any visits on 04/11/22.  Assessment & Plan    Routine Health Maintenance and Physical Exam  Exercise Activities and Dietary recommendations  Goals      DIET - INCREASE WATER INTAKE     Recommend increasing water intake to 6-8 glasses a day.      Exercise 3x per week (30 min per time)     Recommend to  start exercising 3 days a week for at least 30 minutes at a time.        Immunization History  Administered Date(s) Administered   Fluad Quad(high Dose 65+) 03/31/2020, 04/11/2022   Influenza, High Dose Seasonal PF 05/13/2014, 04/01/2016, 05/03/2017   Influenza,inj,Quad PF,6+ Mos 05/08/2013, 04/17/2021   Influenza-Unspecified 05/26/2015, 04/17/2018, 04/17/2021  Moderna SARS-COV2 Booster Vaccination 06/25/2020   Moderna Sars-Covid-2 Vaccination 08/30/2019, 10/01/2019   Pneumococcal Conjugate-13 05/13/2014   Pneumococcal Polysaccharide-23 05/08/2013   Td 04/25/2003   Tdap 08/18/2010   Zoster Recombinat (Shingrix) 02/20/2018, 06/01/2018, 06/25/2018   Zoster, Live 06/02/2008    Health Maintenance  Topic Date Due   TETANUS/TDAP  08/18/2020   COVID-19 Vaccine (3 - Moderna series) 08/20/2020   COLONOSCOPY (Pts 45-103yr Insurance coverage will need to be confirmed)  03/02/2022   MAMMOGRAM  04/20/2023   DEXA SCAN  04/19/2026   Pneumonia Vaccine 75 Years old  Completed   INFLUENZA VACCINE  Completed   Hepatitis C Screening  Completed   Zoster Vaccines- Shingrix  Completed   HPV VACCINES  Aged Out    Discussed health benefits of physical activity, and encouraged her to engage in regular exercise appropriate for her age and condition.  Problem List Items Addressed This Visit       Other   Annual physical exam - Primary    UTD on dental and vision Upcoming appt with derm Refused colonoscopy; request to complete colo guard Things to do to keep yourself healthy  - Exercise at least 30-45 minutes a day, 3-4 days a week.  - Eat a low-fat diet with lots of fruits and vegetables, up to 7-9 servings per day.  - Seatbelts can save your life. Wear them always.  - Smoke detectors on every level of your home, check batteries every year.  - Eye Doctor - have an eye exam every 1-2 years  - Safe sex - if you may be exposed to STDs, use a condom.  - Alcohol -  If you drink, do it  moderately, less than 2 drinks per day.  - HBrandon Choose someone to speak for you if you are not able.  - Depression is common in our stressful world.If you're feeling down or losing interest in things you normally enjoy, please come in for a visit.  - Violence - If anyone is threatening or hurting you, please call immediately.        Relevant Orders   Comprehensive metabolic panel   CBC with Differential/Platelet   TSH + free T4   Lipid panel   Need for influenza vaccination    Consented; VIS made available; no immediate side effects following administration; plan to repeat annually       Relevant Orders   Flu Vaccine QUAD High Dose(Fluad) (Completed)   Prediabetes    Chronic, stable Not on medication Continue to recommend balanced, lower carb meals. Smaller meal size, adding snacks. Choosing water as drink of choice and increasing purposeful exercise.       Relevant Orders   Hemoglobin A1c   Screening for colon cancer    Hx of benign neoplasm; request for colo guard at this time in place of traditional colonoscopy       Relevant Orders   Cologuard     Return in about 1 year (around 04/12/2023) for annual examination.     IVonna Kotyk FNP, have reviewed all documentation for this visit. The documentation on 04/11/22 for the exam, diagnosis, procedures, and orders are all accurate and complete.    EGwyneth Sprout FWoods [phone](phone) [phone](fax)  [place]

## 2022-04-11 NOTE — Assessment & Plan Note (Signed)
Hx of benign neoplasm; request for colo guard at this time in place of traditional colonoscopy

## 2022-04-14 DIAGNOSIS — L821 Other seborrheic keratosis: Secondary | ICD-10-CM | POA: Diagnosis not present

## 2022-04-14 DIAGNOSIS — D2272 Melanocytic nevi of left lower limb, including hip: Secondary | ICD-10-CM | POA: Diagnosis not present

## 2022-04-14 DIAGNOSIS — Z85828 Personal history of other malignant neoplasm of skin: Secondary | ICD-10-CM | POA: Diagnosis not present

## 2022-04-14 DIAGNOSIS — E7800 Pure hypercholesterolemia, unspecified: Secondary | ICD-10-CM | POA: Diagnosis not present

## 2022-04-14 DIAGNOSIS — D2262 Melanocytic nevi of left upper limb, including shoulder: Secondary | ICD-10-CM | POA: Diagnosis not present

## 2022-04-14 DIAGNOSIS — D2261 Melanocytic nevi of right upper limb, including shoulder: Secondary | ICD-10-CM | POA: Diagnosis not present

## 2022-04-14 DIAGNOSIS — R7303 Prediabetes: Secondary | ICD-10-CM | POA: Diagnosis not present

## 2022-04-14 DIAGNOSIS — Z1211 Encounter for screening for malignant neoplasm of colon: Secondary | ICD-10-CM | POA: Diagnosis not present

## 2022-04-14 DIAGNOSIS — Z0000 Encounter for general adult medical examination without abnormal findings: Secondary | ICD-10-CM | POA: Diagnosis not present

## 2022-04-15 LAB — COMPREHENSIVE METABOLIC PANEL
ALT: 38 IU/L — ABNORMAL HIGH (ref 0–32)
AST: 26 IU/L (ref 0–40)
Albumin/Globulin Ratio: 1.9 (ref 1.2–2.2)
Albumin: 4.3 g/dL (ref 3.8–4.8)
Alkaline Phosphatase: 56 IU/L (ref 44–121)
BUN/Creatinine Ratio: 15 (ref 12–28)
BUN: 16 mg/dL (ref 8–27)
Bilirubin Total: 0.3 mg/dL (ref 0.0–1.2)
CO2: 22 mmol/L (ref 20–29)
Calcium: 9.8 mg/dL (ref 8.7–10.3)
Chloride: 100 mmol/L (ref 96–106)
Creatinine, Ser: 1.1 mg/dL — ABNORMAL HIGH (ref 0.57–1.00)
Globulin, Total: 2.3 g/dL (ref 1.5–4.5)
Glucose: 105 mg/dL — ABNORMAL HIGH (ref 70–99)
Potassium: 4.5 mmol/L (ref 3.5–5.2)
Sodium: 138 mmol/L (ref 134–144)
Total Protein: 6.6 g/dL (ref 6.0–8.5)
eGFR: 53 mL/min/{1.73_m2} — ABNORMAL LOW (ref >59)

## 2022-04-15 LAB — HEMOGLOBIN A1C
Est. average glucose Bld gHb Est-mCnc: 120 mg/dL
Hgb A1c MFr Bld: 5.8 % — ABNORMAL HIGH (ref 4.8–5.6)

## 2022-04-15 LAB — CBC WITH DIFFERENTIAL/PLATELET
Basophils Absolute: 0 10*3/uL (ref 0.0–0.2)
Basos: 1 %
EOS (ABSOLUTE): 0.3 10*3/uL (ref 0.0–0.4)
Eos: 5 %
Hematocrit: 31.9 % — ABNORMAL LOW (ref 34.0–46.6)
Hemoglobin: 10.1 g/dL — ABNORMAL LOW (ref 11.1–15.9)
Immature Grans (Abs): 0 10*3/uL (ref 0.0–0.1)
Immature Granulocytes: 0 %
Lymphocytes Absolute: 2.1 10*3/uL (ref 0.7–3.1)
Lymphs: 36 %
MCH: 28.6 pg (ref 26.6–33.0)
MCHC: 31.7 g/dL (ref 31.5–35.7)
MCV: 90 fL (ref 79–97)
Monocytes Absolute: 0.8 10*3/uL (ref 0.1–0.9)
Monocytes: 13 %
Neutrophils Absolute: 2.6 10*3/uL (ref 1.4–7.0)
Neutrophils: 45 %
Platelets: 312 10*3/uL (ref 150–450)
RBC: 3.53 x10E6/uL — ABNORMAL LOW (ref 3.77–5.28)
RDW: 12.4 % (ref 11.7–15.4)
WBC: 5.8 10*3/uL (ref 3.4–10.8)

## 2022-04-15 LAB — LIPID PANEL
Chol/HDL Ratio: 2.2 ratio (ref 0.0–4.4)
Cholesterol, Total: 165 mg/dL (ref 100–199)
HDL: 74 mg/dL (ref >39)
LDL Chol Calc (NIH): 70 mg/dL (ref 0–99)
Triglycerides: 121 mg/dL (ref 0–149)
VLDL Cholesterol Cal: 21 mg/dL (ref 5–40)

## 2022-04-15 LAB — TSH+FREE T4
Free T4: 1.35 ng/dL (ref 0.82–1.77)
TSH: 2.99 u[IU]/mL (ref 0.450–4.500)

## 2022-04-15 NOTE — Addendum Note (Signed)
Addended by: Smitty Knudsen on: 04/15/2022 09:33 AM   Modules accepted: Orders

## 2022-04-15 NOTE — Progress Notes (Signed)
Stable A1c- remains in pre-diabetic range. Continue to recommend balanced, lower carb meals. Smaller meal size, adding snacks. Choosing water as drink of choice and increasing purposeful exercise.  Blood counts show decreased red counts and new anemia. Would recommend follow up repeat labs and then referral to hematology and/or GI to assess for source of bleeding if decrease remains.  All other labs that have returned are normal/stable.  Gwyneth Sprout, [address] [place], [place] [address] [phone] (phone) [phone] (fax) [place]

## 2022-04-20 ENCOUNTER — Ambulatory Visit: Payer: Medicare HMO

## 2022-04-24 LAB — COLOGUARD: COLOGUARD: POSITIVE — AB

## 2022-04-25 ENCOUNTER — Telehealth: Payer: Self-pay

## 2022-04-25 DIAGNOSIS — R195 Other fecal abnormalities: Secondary | ICD-10-CM

## 2022-04-25 NOTE — Progress Notes (Signed)
Colo guard screening for colon cancer came back positive; recommend referral to GI for complete colon cancer screening via colonoscopy to ensure best care.  Please let us know where you would like the referral.  Please let us know if you have any questions.  Thank you, Gwyneth Sprout, [address] [place], [place] [address] [phone] (phone) [phone] (fax) [place]

## 2022-04-25 NOTE — Telephone Encounter (Signed)
-----   Message from Gwyneth Sprout, FNP sent at 04/25/2022  7:55 AM EDT ----- Colo guard screening for colon cancer came back positive; recommend referral to GI for complete colon cancer screening via colonoscopy to ensure best care.  Please let us know where you would like the referral.  Please let us know if you have any questions.  Thank you, Gwyneth Sprout, [address] [place], [place] [address] [phone] (phone) [phone] (fax) [place]

## 2022-04-26 ENCOUNTER — Telehealth: Payer: Self-pay

## 2022-04-26 ENCOUNTER — Other Ambulatory Visit: Payer: Self-pay

## 2022-04-26 ENCOUNTER — Ambulatory Visit (INDEPENDENT_AMBULATORY_CARE_PROVIDER_SITE_OTHER): Payer: Medicare HMO

## 2022-04-26 VITALS — Ht 66.0 in | Wt 176.0 lb

## 2022-04-26 DIAGNOSIS — Z86010 Personal history of colonic polyps: Secondary | ICD-10-CM

## 2022-04-26 DIAGNOSIS — Z0000 Encounter for general adult medical examination without abnormal findings: Secondary | ICD-10-CM

## 2022-04-26 DIAGNOSIS — R195 Other fecal abnormalities: Secondary | ICD-10-CM

## 2022-04-26 MED ORDER — PEG 3350-KCL-NA BICARB-NACL 420 G PO SOLR: 4000.0000 mL | Freq: Once | ORAL | 0 refills | Status: AC

## 2022-04-26 NOTE — Telephone Encounter (Signed)
Gastroenterology Pre-Procedure Review/  Request Date: 05/31/22 Requesting Physician: Dr. Allen Norris  PATIENT REVIEW QUESTIONS: The patient responded to the following health history questions as indicated:    1. Are you having any GI issues? No GI Issues noted by patient however referral noted positive colorectal using cologuard 2. Do you have a personal history of Polyps? yes (patients last colonoscopy was performed by Dr. Allen Norris  03/02/17 21m polyp noted in ascending and descending colon) 3. Do you have a family history of Colon Cancer or Polyps? no 4. Diabetes Mellitus? no 5. Joint replacements in the past 12 months?no 6. Major health problems in the past 3 months?no 7. Any artificial heart valves, MVP, or defibrillator?no    MEDICATIONS & ALLERGIES:    Patient reports the following regarding taking any anticoagulation/antiplatelet therapy:   Plavix, Coumadin, Eliquis, Xarelto, Lovenox, Pradaxa, Brilinta, or Effient? no Aspirin? yes (81 mg daily )  Patient confirms/reports the following medications:  Current Outpatient Medications  Medication Sig Dispense Refill   aspirin 81 MG tablet Take by mouth every other day.      atorvastatin (LIPITOR) 20 MG tablet TAKE 1 TABLET EVERY DAY 90 tablet 3   Biotin 5 MG CAPS daily.      cholecalciferol (VITAMIN D) 1000 units tablet Take 1,000 Units by mouth daily.      influenza vac split quadrivalent (FLUZONE) injection      latanoprost (XALATAN) 0.005 % ophthalmic solution 1 drop at bedtime.     MULTIPLE VITAMIN PO Take by mouth daily.      Omega-3 Fatty Acids (FISH OIL) 1000 MG CAPS daily.      psyllium (METAMUCIL) 58.6 % packet Take 1 packet by mouth daily.     triamterene-hydrochlorothiazide (MAXZIDE-25) 37.5-25 MG tablet TAKE 1 TABLET EVERY DAY 90 tablet 0   No current facility-administered medications for this visit.    Patient confirms/reports the following allergies:  Allergies  Allergen Reactions   Silver Nitrate     LIQUID NITRATE used  to remove warts    No orders of the defined types were placed in this encounter.   AUTHORIZATION INFORMATION Primary Insurance: 1D#: Group #:  Secondary Insurance: 1D#: Group #:  SCHEDULE INFORMATION: Date: 05/31/22 Time: Location: [facility]

## 2022-04-26 NOTE — Patient Instructions (Signed)
[name] , Thank you for taking time to come for your Medicare Wellness Visit. I appreciate your ongoing commitment to your health goals. Please review the following plan we discussed and let me know if I can assist you in the future.   Screening recommendations/referrals: Colonoscopy: has one scheduled 05/31/22 Mammogram: has one scheduled 05/10/22 Bone Density: 04/19/21, every 5 years Recommended yearly ophthalmology/optometry visit for glaucoma screening and checkup Recommended yearly dental visit for hygiene and checkup  Vaccinations: Influenza vaccine: 04/11/22 Pneumococcal vaccine: 05/13/14 Tdap vaccine: 08/18/10, due if have injury Shingles vaccine: Zostavax 06/02/08   Shingrix 02/20/18, 06/01/18   Covid-19:08/30/19, 10/01/19, 06/25/20  Advanced directives: yes  Conditions/risks identified: none  Next appointment: Follow up in one year for your annual wellness visit 05/01/23 @ 10:00 am by phone   Preventive Care 65 Years and Older, Female Preventive care refers to lifestyle choices and visits with your health care provider that can promote health and wellness. What does preventive care include? A yearly physical exam. This is also called an annual well check. Dental exams once or twice a year. Routine eye exams. Ask your health care provider how often you should have your eyes checked. Personal lifestyle choices, including: Daily care of your teeth and gums. Regular physical activity. Eating a healthy diet. Avoiding tobacco and drug use. Limiting alcohol use. Practicing safe sex. Taking low-dose aspirin every day. Taking vitamin and mineral supplements as recommended by your health care provider. What happens during an annual well check? The services and screenings done by your health care provider during your annual well check will depend on your age, overall health, lifestyle risk factors, and family history of disease. Counseling  Your health care provider may ask you  questions about your: Alcohol use. Tobacco use. Drug use. Emotional well-being. Home and relationship well-being. Sexual activity. Eating habits. History of falls. Memory and ability to understand (cognition). Work and work Statistician. Reproductive health. Screening  You may have the following tests or measurements: Height, weight, and BMI. Blood pressure. Lipid and cholesterol levels. These may be checked every 5 years, or more frequently if you are over 44 years old. Skin check. Lung cancer screening. You may have this screening every year starting at age 85 if you have a 30-pack-year history of smoking and currently smoke or have quit within the past 15 years. Fecal occult blood test (FOBT) of the stool. You may have this test every year starting at age 31. Flexible sigmoidoscopy or colonoscopy. You may have a sigmoidoscopy every 5 years or a colonoscopy every 10 years starting at age 3. Hepatitis C blood test. Hepatitis B blood test. Sexually transmitted disease (STD) testing. Diabetes screening. This is done by checking your blood sugar (glucose) after you have not eaten for a while (fasting). You may have this done every 1-3 years. Bone density scan. This is done to screen for osteoporosis. You may have this done starting at age 52. Mammogram. This may be done every 1-2 years. Talk to your health care provider about how often you should have regular mammograms. Talk with your health care provider about your test results, treatment options, and if necessary, the need for more tests. Vaccines  Your health care provider may recommend certain vaccines, such as: Influenza vaccine. This is recommended every year. Tetanus, diphtheria, and acellular pertussis (Tdap, Td) vaccine. You may need a Td booster every 10 years. Zoster vaccine. You may need this after age 20. Pneumococcal 13-valent conjugate (PCV13) vaccine. One dose is recommended  after age 75. Pneumococcal polysaccharide  (PPSV23) vaccine. One dose is recommended after age 75. Talk to your health care provider about which screenings and vaccines you need and how often you need them. This information is not intended to replace advice given to you by your health care provider. Make sure you discuss any questions you have with your health care provider. Document Released: 08/07/2015 Document Revised: 03/30/2016 Document Reviewed: 05/12/2015 Elsevier Interactive Patient Education  2017 [employer] Prevention in the Home Falls can cause injuries. They can happen to people of all ages. There are many things you can do to make your home safe and to help prevent falls. What can I do on the outside of my home? Regularly fix the edges of walkways and driveways and fix any cracks. Remove anything that might make you trip as you walk through a door, such as a raised step or threshold. Trim any bushes or trees on the path to your home. Use bright outdoor lighting. Clear any walking paths of anything that might make someone trip, such as rocks or tools. Regularly check to see if handrails are loose or broken. Make sure that both sides of any steps have handrails. Any raised decks and porches should have guardrails on the edges. Have any leaves, snow, or ice cleared regularly. Use sand or salt on walking paths during winter. Clean up any spills in your garage right away. This includes oil or grease spills. What can I do in the bathroom? Use night lights. Install grab bars by the toilet and in the tub and shower. Do not use towel bars as grab bars. Use non-skid mats or decals in the tub or shower. If you need to sit down in the shower, use a plastic, non-slip stool. Keep the floor dry. Clean up any water that spills on the floor as soon as it happens. Remove soap buildup in the tub or shower regularly. Attach bath mats securely with double-sided non-slip rug tape. Do not have throw rugs and other things on the  floor that can make you trip. What can I do in the bedroom? Use night lights. Make sure that you have a light by your bed that is easy to reach. Do not use any sheets or blankets that are too big for your bed. They should not hang down onto the floor. Have a firm chair that has side arms. You can use this for support while you get dressed. Do not have throw rugs and other things on the floor that can make you trip. What can I do in the kitchen? Clean up any spills right away. Avoid walking on wet floors. Keep items that you use a lot in easy-to-reach places. If you need to reach something above you, use a strong step stool that has a grab bar. Keep electrical cords out of the way. Do not use floor polish or wax that makes floors slippery. If you must use wax, use non-skid floor wax. Do not have throw rugs and other things on the floor that can make you trip. What can I do with my stairs? Do not leave any items on the stairs. Make sure that there are handrails on both sides of the stairs and use them. Fix handrails that are broken or loose. Make sure that handrails are as long as the stairways. Check any carpeting to make sure that it is firmly attached to the stairs. Fix any carpet that is loose or worn. Avoid having throw  rugs at the top or bottom of the stairs. If you do have throw rugs, attach them to the floor with carpet tape. Make sure that you have a light switch at the top of the stairs and the bottom of the stairs. If you do not have them, ask someone to add them for you. What else can I do to help prevent falls? Wear shoes that: Do not have high heels. Have rubber bottoms. Are comfortable and fit you well. Are closed at the toe. Do not wear sandals. If you use a stepladder: Make sure that it is fully opened. Do not climb a closed stepladder. Make sure that both sides of the stepladder are locked into place. Ask someone to hold it for you, if possible. Clearly mark and make  sure that you can see: Any grab bars or handrails. First and last steps. Where the edge of each step is. Use tools that help you move around (mobility aids) if they are needed. These include: Canes. Walkers. Scooters. Crutches. Turn on the lights when you go into a dark area. Replace any light bulbs as soon as they burn out. Set up your furniture so you have a clear path. Avoid moving your furniture around. If any of your floors are uneven, fix them. If there are any pets around you, be aware of where they are. Review your medicines with your doctor. Some medicines can make you feel dizzy. This can increase your chance of falling. Ask your doctor what other things that you can do to help prevent falls. This information is not intended to replace advice given to you by your health care provider. Make sure you discuss any questions you have with your health care provider. Document Released: 05/07/2009 Document Revised: 12/17/2015 Document Reviewed: 08/15/2014 Elsevier Interactive Patient Education  2017 [employer].

## 2022-04-26 NOTE — Progress Notes (Signed)
Virtual Visit via Telephone Note  I connected with  Cindy Vega on 04/26/22 at 10:30 AM EDT by telephone and verified that I am speaking with the correct person using two identifiers.  Location: Patient: home Provider: [facility] Persons participating in the virtual visit: [place]   I discussed the limitations, risks, security and privacy concerns of performing an evaluation and management service by telephone and the availability of in person appointments. The patient expressed understanding and agreed to proceed.  Interactive audio and video telecommunications were attempted between this nurse and patient, however failed, due to patient having technical difficulties OR patient did not have access to video capability.  We continued and completed visit with audio only.  Some vital signs may be absent or patient reported.   Dionisio David, LPN  Subjective:   Cindy Vega is a 76 y.o. female who presents for Medicare Annual (Subsequent) preventive examination.  Review of Systems     Cardiac Risk Factors include: advanced age (>15mn, >>40women);hypertension     Objective:    There were no vitals filed for this visit. There is no height or weight on file to calculate BMI.     04/26/2022   10:34 AM 02/17/2020   10:38 AM 01/19/2020    3:10 PM 02/13/2019    9:57 AM 02/07/2018   10:29 AM 01/28/2018   10:11 AM 03/02/2017   10:00 AM  Advanced Directives  Does Patient Have a Medical Advance Directive? Yes Yes No Yes Yes Yes Yes  Type of AParamedicof APe[place]Living will [place] will  Healthcare Power of [place]  Does patient want to make changes to medical advance directive? No - Patient declined      Yes (MAU/Ambulatory/Procedural Areas - Information given)  Copy of HDattoin Chart? Yes - validated most recent copy scanned in chart  (See row information) No - copy requested  No - copy requested No - copy requested      Current Medications (verified) Outpatient Encounter Medications as of 04/26/2022  Medication Sig   aspirin 81 MG tablet Take by mouth every other day.    atorvastatin (LIPITOR) 20 MG tablet TAKE 1 TABLET EVERY DAY   Biotin 5 MG CAPS daily.    cholecalciferol (VITAMIN D) 1000 units tablet Take 1,000 Units by mouth daily.    influenza vac split quadrivalent (FLUZONE) injection    latanoprost (XALATAN) 0.005 % ophthalmic solution 1 drop at bedtime.   MULTIPLE VITAMIN PO Take by mouth daily.    Omega-3 Fatty Acids (FISH OIL) 1000 MG CAPS daily.    polyethylene glycol-electrolytes (NULYTELY) 420 g solution Take 4,000 mLs by mouth once for 1 dose. (Patient not taking: Reported on 04/26/2022)   psyllium (METAMUCIL) 58.6 % packet Take 1 packet by mouth daily.   triamterene-hydrochlorothiazide (MAXZIDE-25) 37.5-25 MG tablet TAKE 1 TABLET EVERY DAY   No facility-administered encounter medications on file as of 04/26/2022.    Allergies (verified) Silver nitrate   History: Past Medical History:  Diagnosis Date   Arthritis    fingers   Cancer ([place]    basal cell   Hypertension    PONV (postoperative nausea and vomiting)    after hysterectomy   Past Surgical History:  Procedure Laterality Date   ABDOMINAL HYSTERECTOMY     BREAST CYST EXCISION Right 1996   cyst removed-neg   CATARACT EXTRACTION  COLONOSCOPY WITH PROPOFOL N/A 03/02/2017   Procedure: COLONOSCOPY WITH PROPOFOL;  Surgeon: Lucilla Lame, MD;  Location: [facility];  Service: Endoscopy;  Laterality: N/A;   POLYPECTOMY N/A 03/02/2017   Procedure: POLYPECTOMY;  Surgeon: Lucilla Lame, MD;  Location: [place];  Service: Endoscopy;  Laterality: N/A;   Family History  Problem Relation Age of Onset   Hypertension Mother    Heart disease Mother    Diabetes Mother    Diabetes Father    Prostate cancer Father    Breast cancer  Sister 73   Graves' disease Sister    Breast cancer Sister    Social History   Socioeconomic History   Marital status: Married    Spouse name: Not on file   Number of children: 2   Years of education: Not on file   Highest education level: Bachelor's degree (e.g., BA, AB, BS)  Occupational History   Occupation: retired  Tobacco Use   Smoking status: Never   Smokeless tobacco: Never  Vaping Use   Vaping Use: Never used  Substance and Sexual Activity   Alcohol use: Yes    Alcohol/week: 3.0 standard drinks of alcohol    Types: 3 Glasses of wine per week   Drug use: No   Sexual activity: Not on file  Other Topics Concern   Not on file  Social History Narrative   Not on file   Social Determinants of Health   Financial Resource Strain: Low Risk  (04/26/2022)   Overall Financial Resource Strain (CARDIA)    Difficulty of Paying Living Expenses: Not hard at all  Food Insecurity: No Food Insecurity (04/26/2022)   Hunger Vital Sign    Worried About Running Out of Food in the Last Year: Never true    Ran Out of Food in the Last Year: Never true  Transportation Needs: No Transportation Needs (04/26/2022)   PRAPARE - Hydrologist (Medical): No    Lack of Transportation (Non-Medical): No  Physical Activity: Insufficiently Active (04/26/2022)   Exercise Vital Sign    Days of Exercise per Week: 2 days    Minutes of Exercise per Session: 60 min  Stress: No Stress Concern Present (04/26/2022)   [place]    Feeling of Stress : Not at all  Social Connections: Moderately Integrated (04/26/2022)   Social Connection and Isolation Panel [NHANES]    Frequency of Communication with Friends and Family: More than three times a week    Frequency of Social Gatherings with Friends and Family: More than three times a week    Attends Religious Services: More than 4 times per year    Active Member of [employer]  or Organizations: No    Attends Music therapist: Never    Marital Status: Married    Tobacco Counseling Counseling given: Not Answered   Clinical Intake:  Pre-visit preparation completed: Yes  Pain : No/denies pain     Nutritional Risks: None Diabetes: No  How often do you need to have someone help you when you read instructions, pamphlets, or other written materials from your doctor or pharmacy?: 1 - Never  Diabetic?no  Interpreter Needed?: No  Information entered by :: Kirke Shaggy, LPN   Activities of Daily Living    04/26/2022   10:35 AM 04/11/2022   10:05 AM  In your present state of health, do you have any difficulty performing the following activities:  Hearing? 0  0  Vision? 0 0  Difficulty concentrating or making decisions? 0 0  Walking or climbing stairs? 0 0  Dressing or bathing? 0 0  Doing errands, shopping? 0 0  Preparing Food and eating ? N   Using the Toilet? N   In the past six months, have you accidently leaked urine? N   Do you have problems with loss of bowel control? N   Managing your Medications? N   Managing your Finances? N   Housekeeping or managing your Housekeeping? N     Patient Care Team: Gwyneth Sprout, FNP as PCP - General (Family Medicine) Eulogio Bear, MD as Consulting Physician (Ophthalmology) Oneta Rack, MD (Dermatology)  Indicate any recent Medical Services you may have received from other than [facility] providers in the past year (date may be approximate).     Assessment:   This is a routine wellness examination for Cindy Vega.  Hearing/Vision screen Hearing Screening - Comments:: No aids Vision Screening - Comments:: Readers- [facility]  Dietary issues and exercise activities discussed: Current Exercise Habits: Home exercise routine, Type of exercise: walking, Time (Minutes): 60, Frequency (Times/Week): 2, Weekly Exercise (Minutes/Week): 120, Intensity: Mild   Goals Addressed              This Visit's Progress    DIET - EAT MORE FRUITS AND VEGETABLES         Depression Screen    04/26/2022   10:32 AM 04/11/2022   10:05 AM 04/20/2021    6:35 PM 04/01/2021    1:51 PM 03/31/2020    2:11 PM 02/17/2020   10:34 AM 02/13/2019    9:57 AM  PHQ 2/9 Scores  PHQ - 2 Score 0 0 0 0 0 0 0  PHQ- 9 Score 0 0 0 0 0      Fall Risk    04/26/2022   10:34 AM 04/11/2022   10:05 AM 04/20/2021    6:31 PM 04/01/2021    1:51 PM 02/17/2020   10:38 AM  Fall Risk   Falls in the past year? 0 0 0 0 0  Number falls in past yr: 0 0  0 0  Injury with Fall? 0 0  0 0  Risk for fall due to : No Fall Risks No Fall Risks     Follow up Falls prevention discussed;Falls evaluation completed Falls evaluation completed       FALL RISK PREVENTION PERTAINING TO THE HOME:  Any stairs in or around the home? No  If so, are there any without handrails? No  Home free of loose throw rugs in walkways, pet beds, electrical cords, etc? Yes  Adequate lighting in your home to reduce risk of falls? Yes   ASSISTIVE DEVICES UTILIZED TO PREVENT FALLS:  Life alert? No  Use of a cane, walker or w/c? No  Grab bars in the bathroom? Yes  Shower chair or bench in shower? No  Elevated toilet seat or a handicapped toilet? Yes   Cognitive Function:        04/26/2022   10:36 AM 04/20/2021    6:36 PM  6CIT Screen  What Year? 0 points   What month? 0 points 0 points  What time? 0 points 0 points  Count back from 20 0 points 0 points  Months in reverse 0 points 0 points  Repeat phrase 0 points 0 points  Total Score 0 points     Immunizations Immunization History  Administered Date(s) Administered   Fluad  Quad(high Dose 65+) 03/31/2020, 04/11/2022   Influenza, High Dose Seasonal PF 05/13/2014, 04/01/2016, 05/03/2017   Influenza,inj,Quad PF,6+ Mos 05/08/2013, 04/17/2021   Influenza-Unspecified 05/26/2015, 04/17/2018, 04/17/2021   Moderna SARS-COV2 Booster Vaccination 06/25/2020   Moderna Sars-Covid-2 Vaccination  08/30/2019, 10/01/2019   Pneumococcal Conjugate-13 05/13/2014   Pneumococcal Polysaccharide-23 05/08/2013   Td 04/25/2003   Tdap 08/18/2010   Zoster Recombinat (Shingrix) 02/20/2018, 06/01/2018, 06/25/2018   Zoster, Live 06/02/2008    TDAP status: Due, Education has been provided regarding the importance of this vaccine. Advised may receive this vaccine at local pharmacy or Health Dept. Aware to provide a copy of the vaccination record if obtained from local pharmacy or Health Dept. Verbalized acceptance and understanding.  Flu Vaccine status: Up to date  Pneumococcal vaccine status: Up to date  Covid-19 vaccine status: Completed vaccines  Qualifies for Shingles Vaccine? Yes   Zostavax completed Yes   Shingrix Completed?: Yes  Screening Tests Health Maintenance  Topic Date Due   TETANUS/TDAP  08/18/2020   COVID-19 Vaccine (3 - Moderna series) 08/20/2020   COLONOSCOPY (Pts 45-33yr Insurance coverage will need to be confirmed)  03/02/2022   MAMMOGRAM  04/20/2023   DEXA SCAN  04/19/2026   Pneumonia Vaccine 75 Years old  Completed   INFLUENZA VACCINE  Completed   Hepatitis C Screening  Completed   Zoster Vaccines- Shingrix  Completed   HPV VACCINES  Aged Out    Health Maintenance  Health Maintenance Due  Topic Date Due   TETANUS/TDAP  08/18/2020   COVID-19 Vaccine (3 - Moderna series) 08/20/2020   COLONOSCOPY (Pts 45-429yrInsurance coverage will need to be confirmed)  03/02/2022    Colorectal cancer screening: Type of screening: Colonoscopy. Completed 03/10/17. Repeat every 5 years- has one scheduled 11/7  Mammogram status: Completed 05/10/21. Repeat every year- scheduled 05/10/22  Bone Density status: Completed 04/19/21. Results reflect: Bone density results: OSTEOPENIA. Repeat every 5 years.  Lung Cancer Screening: (Low Dose CT Chest recommended if Age 75-80ears, 30 pack-year currently smoking OR have quit w/in 15years.) does not qualify.   Additional  Screening:  Hepatitis C Screening: does qualify; Completed 11/14/13  Vision Screening: Recommended annual ophthalmology exams for early detection of glaucoma and other disorders of the eye. Is the patient up to date with their annual eye exam?  Yes  Who is the provider or what is the name of the office in which the patient attends annual eye exams? [place] pt is not established with a provider, would they like to be referred to a provider to establish care? No .   Dental Screening: Recommended annual dental exams for proper oral hygiene  Community Resource Referral / Chronic Care Management: CRR required this visit?  No   CCM required this visit?  No      Plan:     I have personally reviewed and noted the following in the patient's chart:   Medical and social history Use of alcohol, tobacco or illicit drugs  Current medications and supplements including opioid prescriptions. Patient is not currently taking opioid prescriptions. Functional ability and status Nutritional status Physical activity Advanced directives List of other physicians Hospitalizations, surgeries, and ER visits in previous 12 months Vitals Screenings to include cognitive, depression, and falls Referrals and appointments  In addition, I have reviewed and discussed with patient certain preventive protocols, quality metrics, and best practice recommendations. A written personalized care plan for preventive services as well as general preventive health recommendations were provided to patient.  Dionisio David, LPN   [birth year]   Nurse Notes: none

## 2022-05-10 ENCOUNTER — Ambulatory Visit
Admission: RE | Admit: 2022-05-10 | Discharge: 2022-05-10 | Disposition: A | Discharge status: Home / Self Care | Payer: Medicare HMO | Source: Ambulatory Visit | Attending: Family Medicine | Admitting: Family Medicine

## 2022-05-10 DIAGNOSIS — Z1231 Encounter for screening mammogram for malignant neoplasm of breast: Secondary | ICD-10-CM | POA: Insufficient documentation

## 2022-05-11 NOTE — Progress Notes (Signed)
Hi Chisa  Normal mammogram; repeat in 1 year. Hope you have a great birthday :)  Please let [place] know if you have any questions.  Thank you,  Tally Joe, FNP

## 2022-05-16 ENCOUNTER — Ambulatory Visit: Payer: Self-pay | Admitting: *Deleted

## 2022-05-16 ENCOUNTER — Encounter: Payer: Self-pay | Admitting: Family Medicine

## 2022-05-16 ENCOUNTER — Ambulatory Visit (INDEPENDENT_AMBULATORY_CARE_PROVIDER_SITE_OTHER): Payer: Medicare HMO | Admitting: Family Medicine

## 2022-05-16 VITALS — BP 172/75 | HR 87 | Resp 16 | Wt 176.0 lb

## 2022-05-16 DIAGNOSIS — I10 Essential (primary) hypertension: Secondary | ICD-10-CM

## 2022-05-16 DIAGNOSIS — M109 Gout, unspecified: Secondary | ICD-10-CM | POA: Diagnosis not present

## 2022-05-16 MED ORDER — COLCHICINE 0.6 MG PO TABS: 0.6000 mg | ORAL_TABLET | Freq: Every day | ORAL | 0 refills | Status: DC

## 2022-05-16 MED ORDER — COLCHICINE 0.6 MG PO TABS: ORAL_TABLET | ORAL | 0 refills | Status: DC

## 2022-05-16 NOTE — Progress Notes (Signed)
   SUBJECTIVE:   CHIEF COMPLAINT / HPI:   FOOT PAIN Duration: days Involved foot: left Mechanism of injury: unknown Location: L great toe MTP Quality:  intense, constant pain Frequency: constant Radiation: no Aggravating factors: none  Alleviating factors: cherries Status: a little better Treatments attempted: dried cherries,   Relief with NSAIDs?:  No NSAIDs Taken Weakness with weight bearing or walking: no Stiffness: yes Swelling: yes Redness: yes Bruising: no Paresthesias / decreased sensation: no  Fevers:no Has had gout flare about 2 years ago, self resolved. Mom and son with h/o gout.   OBJECTIVE:   BP (!) 172/75 (BP Location: Right Arm, Patient Position: Sitting, Cuff Size: Normal)   Pulse 87   Resp 16   Wt 176 lb (79.8 kg)   SpO2 99%   BMI 28.41 kg/m   Gen: well appearing, in NAD Foot: erythematous, swollen 1st L MTP, TTP and with movement.   ASSESSMENT/PLAN:   Gout Rx colchicine. Obtain BMP, uric acid today. Given previous good BP control on current therapy and this makes only 2nd mild gout flare, likely ok to continue current therapy but if continues to recur, consider discontinuing HCTZ. F/u prn.  Essential hypertension Elevated today, likely 2/2 pain as previously well controlled. Consider changing HCTZ given gout flare if recurs as above.      Myles Gip, DO

## 2022-05-16 NOTE — Telephone Encounter (Signed)
  Chief Complaint: toe pain Symptoms: swelling, red Frequency: started yesterday Pertinent Negatives: Patient denies fever, injury Disposition: '[]'$ ED /'[]'$ Urgent Care (no appt availability in office) / '[x]'$ Appointment(In office/virtual)/ '[]'$  [facility]/ '[]'$ Home Care/ '[]'$ Refused Recommended Disposition /'[]'$ [facility]/ '[]'$  Follow-up with PCP Additional Notes: Appt in office this morning, home care discussed.  Reason for Disposition  [1] SEVERE pain (e.g., excruciating, unable to do any normal activities) AND [2] not improved after 2 hours of pain medicine  Answer Assessment - Initial Assessment Questions 1. ONSET: "When did the pain start?"      yesterday 2. LOCATION: "Where is the pain located?"   (e.g., around nail, entire toe, at foot joint)      Left great toe 3. PAIN: "How bad is the pain?"    (Scale 1-10; or mild, moderate, severe)   -  MILD (1-3): doesn't interfere with normal activities    -  MODERATE (4-7): interferes with normal activities (e.g., work or school) or awakens from sleep, limping    -  SEVERE (8-10): excruciating pain, unable to do any normal activities, unable to walk     9 4. APPEARANCE: "What does the toe look like?" (e.g., redness, swelling, bruising, pallor)     Red, swollen 5. CAUSE: "What do you think is causing the toe pain?"     gout 6. OTHER SYMPTOMS: "Do you have any other symptoms?" (e.g., leg pain, rash, fever, numbness)     no 7. PREGNANCY: "Is there any chance you are pregnant?" "When was your last menstrual period?"     no  Protocols used: Toe Pain-A-AH

## 2022-05-16 NOTE — Assessment & Plan Note (Signed)
Rx colchicine. Obtain BMP, uric acid today. Given previous good BP control on current therapy and this makes only 2nd mild gout flare, likely ok to continue current therapy but if continues to recur, consider discontinuing HCTZ. F/u prn.

## 2022-05-16 NOTE — Assessment & Plan Note (Addendum)
Elevated today, likely 2/2 pain as previously well controlled. Consider changing HCTZ given gout flare if recurs as above.

## 2022-05-17 LAB — BASIC METABOLIC PANEL
BUN/Creatinine Ratio: 15 (ref 12–28)
BUN: 16 mg/dL (ref 8–27)
CO2: 21 mmol/L (ref 20–29)
Calcium: 9.9 mg/dL (ref 8.7–10.3)
Chloride: 100 mmol/L (ref 96–106)
Creatinine, Ser: 1.08 mg/dL — ABNORMAL HIGH (ref 0.57–1.00)
Glucose: 105 mg/dL — ABNORMAL HIGH (ref 70–99)
Potassium: 4.2 mmol/L (ref 3.5–5.2)
Sodium: 138 mmol/L (ref 134–144)
eGFR: 54 mL/min/{1.73_m2} — ABNORMAL LOW (ref >59)

## 2022-05-17 LAB — CBC WITH DIFFERENTIAL
Basophils Absolute: 0.1 10*3/uL (ref 0.0–0.2)
Basos: 1 %
EOS (ABSOLUTE): 0.4 10*3/uL (ref 0.0–0.4)
Eos: 4 %
Hematocrit: 26.6 % — ABNORMAL LOW (ref 34.0–46.6)
Hemoglobin: 8.6 g/dL — ABNORMAL LOW (ref 11.1–15.9)
Immature Grans (Abs): 0 10*3/uL (ref 0.0–0.1)
Immature Granulocytes: 1 %
Lymphocytes Absolute: 2.1 10*3/uL (ref 0.7–3.1)
Lymphs: 27 %
MCH: 26.8 pg (ref 26.6–33.0)
MCHC: 32.3 g/dL (ref 31.5–35.7)
MCV: 83 fL (ref 79–97)
Monocytes Absolute: 0.9 10*3/uL (ref 0.1–0.9)
Monocytes: 12 %
Neutrophils Absolute: 4.5 10*3/uL (ref 1.4–7.0)
Neutrophils: 55 %
RBC: 3.21 x10E6/uL — ABNORMAL LOW (ref 3.77–5.28)
RDW: 13.1 % (ref 11.7–15.4)
WBC: 8 10*3/uL (ref 3.4–10.8)

## 2022-05-17 LAB — URIC ACID: Uric Acid: 7.8 mg/dL (ref 3.1–7.9)

## 2022-05-30 ENCOUNTER — Encounter: Payer: Self-pay | Admitting: Gastroenterology

## 2022-05-31 ENCOUNTER — Ambulatory Visit: Admission: RE | Admit: 2022-05-31 | Payer: Medicare HMO | Source: Home / Self Care | Admitting: Gastroenterology

## 2022-05-31 ENCOUNTER — Encounter: Admission: RE | Payer: Self-pay | Source: Home / Self Care

## 2022-05-31 ENCOUNTER — Telehealth: Payer: Self-pay | Admitting: *Deleted

## 2022-05-31 SURGERY — COLONOSCOPY WITH PROPOFOL
Anesthesia: General

## 2022-05-31 NOTE — Progress Notes (Unsigned)
I,Sulibeya S Dimas,acting as a Education administrator for Gwyneth Sprout, FNP.,have documented all relevant documentation on the behalf of Gwyneth Sprout, FNP,as directed by  Gwyneth Sprout, FNP while in the presence of Gwyneth Sprout, FNP.     Established patient visit   Patient: Cindy Vega   DOB: Jul 19, 1946   76 y.o. Female  MRN: [id number] Visit Date: 06/01/2022  Today's healthcare provider: Gwyneth Sprout, FNP   No chief complaint on file.  Subjective    HPI  Follow up for lab  The patient was last seen for this 2 weeks ago. Changes made at last visit include no changes.       She feels that condition is Worse. She reports she was scheduled for a colonoscopy. However, she was not able to take the prep. She reports vomiting after drinking half of the liquid. She canceled colonoscopy that was scheduled for yesterday. Patient has not rescheduled.  She reports feeling more tired and was seeing "pink" color in her vomit and red blood in her stools.  Patient reports her nails are thin and peeling.  -----------------------------------------------------------------------------------------   Medications: Outpatient Medications Prior to Visit  Medication Sig   aspirin 81 MG tablet Take by mouth every other day.    atorvastatin (LIPITOR) 20 MG tablet TAKE 1 TABLET EVERY DAY   Biotin 5 MG CAPS daily.    cholecalciferol (VITAMIN D) 1000 units tablet Take 1,000 Units by mouth daily.    latanoprost (XALATAN) 0.005 % ophthalmic solution 1 drop at bedtime.   MULTIPLE VITAMIN PO Take by mouth daily.    Omega-3 Fatty Acids (FISH OIL) 1000 MG CAPS daily.    psyllium (METAMUCIL) 58.6 % packet Take 1 packet by mouth daily.   triamterene-hydrochlorothiazide (MAXZIDE-25) 37.5-25 MG tablet TAKE 1 TABLET EVERY DAY   [DISCONTINUED] colchicine 0.6 MG tablet Take 2 tablets on day 1, then 1 tablet by mouth daily after until symptoms resolve. (Patient not taking: Reported on 06/01/2022)   [DISCONTINUED] influenza  vac split quadrivalent (FLUZONE) injection  (Patient not taking: Reported on 06/01/2022)   No facility-administered medications prior to visit.    Review of Systems  Constitutional:  Positive for fatigue. Negative for appetite change, diaphoresis, fever and unexpected weight change.  HENT:  Positive for voice change. Negative for trouble swallowing.   Respiratory:  Positive for cough. Negative for chest tightness and shortness of breath.   Cardiovascular:  Positive for leg swelling. Negative for chest pain and palpitations.  Gastrointestinal:  Positive for abdominal distention, blood in stool, constipation, nausea and vomiting. Negative for abdominal pain, anal bleeding and diarrhea.  Genitourinary:  Negative for dysuria and hematuria.     Objective    BP 135/76 (BP Location: Left Arm, Patient Position: Sitting, Cuff Size: Large)   Pulse 89   Temp 98.4 F (36.9 C) (Oral)   Resp 16   Ht '5\' 6"'$  (1.676 m)   Wt 171 lb 4.8 oz (77.7 kg)   BMI 27.65 kg/m   Physical Exam Vitals and nursing note reviewed.  Constitutional:      General: She is not in acute distress.    Appearance: Normal appearance. She is overweight. She is not ill-appearing, toxic-appearing or diaphoretic.  HENT:     Head: Normocephalic and atraumatic.  Cardiovascular:     Rate and Rhythm: Normal rate and regular rhythm.     Pulses: Normal pulses.     Heart sounds: Normal heart sounds. No murmur heard.  No friction rub. No gallop.  Pulmonary:     Effort: Pulmonary effort is normal. No respiratory distress.     Breath sounds: Normal breath sounds. No stridor. No wheezing, rhonchi or rales.  Chest:     Chest wall: No tenderness.  Abdominal:     General: Bowel sounds are normal.     Palpations: Abdomen is soft.  Musculoskeletal:        General: No swelling, tenderness, deformity or signs of injury. Normal range of motion.     Right lower leg: No edema.     Left lower leg: No edema.  Skin:    General: Skin is  warm and dry.     Capillary Refill: Capillary refill takes less than 2 seconds.     Coloration: Skin is not jaundiced or pale.     Findings: No bruising, erythema, lesion or rash.  Neurological:     General: No focal deficit present.     Mental Status: She is alert and oriented to person, place, and time. Mental status is at baseline.     Cranial Nerves: No cranial nerve deficit.     Sensory: No sensory deficit.     Motor: No weakness.     Coordination: Coordination normal.  Psychiatric:        Mood and Affect: Mood normal.        Behavior: Behavior normal.        Thought Content: Thought content normal.        Judgment: Judgment normal.     No results found for any visits on 06/01/22.  Assessment & Plan     Problem List Items Addressed This Visit       Other   Iron deficiency anemia due to chronic blood loss - Primary    Concern for IDA d/t GI loss Was unable to complete prep for colonoscopy Last performed 02/2017; pt did not wish to repeat; however, colo guard was positive 03/2022 Significant decrease in RBC, HGB, HCT over past year and worsening in last month Repeat CBC and B12/folate today Consult via secure messaging with Dr Allen Norris at [facility]- whose team will reach back out to the patient to schedule with change in prep to prevent nausea and vomiting  05/16/22; 04/14/22; 03/2021 RBC 3.4-3.53-4.33 HGB 8.6--10.1--14.3 HCT 26.6--31.9--41.2  Likely nail complaints may be related to GI loss      Relevant Orders   B12 and Folate Panel   CBC with Differential/Platelet     Return if symptoms worsen or fail to improve.      Vonna Kotyk, FNP, have reviewed all documentation for this visit. The documentation on 06/01/22 for the exam, diagnosis, procedures, and orders are all accurate and complete.    Gwyneth Sprout, [place] [phone] (phone) [phone] (fax)  [place]

## 2022-05-31 NOTE — Telephone Encounter (Signed)
Patient called office because she could not complete her prep solution. Patient was nauseous, vomiting, and believe she saw blood. She called [facility] to let them know she will not be able to come for the colonoscopy this morning.  Patient stated that she will not reschedule as of yet. She wants to check with her primary provider because of the blood.

## 2022-06-01 ENCOUNTER — Ambulatory Visit (INDEPENDENT_AMBULATORY_CARE_PROVIDER_SITE_OTHER): Payer: Medicare HMO | Admitting: Family Medicine

## 2022-06-01 ENCOUNTER — Telehealth: Payer: Self-pay

## 2022-06-01 ENCOUNTER — Encounter: Payer: Self-pay | Admitting: Family Medicine

## 2022-06-01 ENCOUNTER — Other Ambulatory Visit: Payer: Self-pay

## 2022-06-01 VITALS — BP 135/76 | HR 89 | Temp 98.4°F | Resp 16 | Ht 66.0 in | Wt 171.3 lb

## 2022-06-01 DIAGNOSIS — Z86010 Personal history of colonic polyps: Secondary | ICD-10-CM

## 2022-06-01 DIAGNOSIS — D500 Iron deficiency anemia secondary to blood loss (chronic): Secondary | ICD-10-CM

## 2022-06-01 MED ORDER — NA SULFATE-K SULFATE-MG SULF 17.5-3.13-1.6 GM/177ML PO SOLN: 354.0000 mL | Freq: Once | ORAL | 0 refills | Status: AC

## 2022-06-01 MED ORDER — SUTAB 1479-225-188 MG PO TABS: 12.0000 | ORAL_TABLET | Freq: Once | ORAL | 0 refills | Status: AC

## 2022-06-01 NOTE — Telephone Encounter (Signed)
Contacted patient to reschedule her colonoscopy with Dr. Allen Norris using different colonoscopy prep.  Explained that last time she did have Suprep Bowel Prep however it is not preferred by her insurance and this is why she received Nulytely Prep.  She understood.  I apologized for the prep causing her to experience nausea and vomiting.  She and I discussed the two prep prescriptions provided by her PCP which were Suprep and Sutab.  She opted for Suprep.  I told her I would go ahead and call it into the pharmacy for her.  Instructions provided for Suprep and patient has been rescheduled for 06/13/22 with Dr. Allen Norris at [facility].  Pharmacy contacted to give new rx for Suprep.  They advised that this is not on her formulary.  I informed them that patient is aware of this. I've contacted patient to make her aware that the prep will cost her $102.  Thanks,  [employer]

## 2022-06-01 NOTE — Assessment & Plan Note (Signed)
Concern for IDA d/t GI loss Was unable to complete prep for colonoscopy Last performed 02/2017; pt did not wish to repeat; however, colo guard was positive 03/2022 Significant decrease in RBC, HGB, HCT over past year and worsening in last month Repeat CBC and B12/folate today Consult via secure messaging with Dr Allen Norris at [facility]- whose team will reach back out to the patient to schedule with change in prep to prevent nausea and vomiting  05/16/22; 04/14/22; 03/2021 RBC 3.4-3.53-4.33 HGB 8.6--10.1--14.3 HCT 26.6--31.9--41.2  Likely nail complaints may be related to GI loss

## 2022-06-02 ENCOUNTER — Other Ambulatory Visit: Payer: Self-pay | Admitting: Family Medicine

## 2022-06-02 LAB — CBC WITH DIFFERENTIAL/PLATELET
Basophils Absolute: 0 10*3/uL (ref 0.0–0.2)
Basos: 1 %
EOS (ABSOLUTE): 0.3 10*3/uL (ref 0.0–0.4)
Eos: 4 %
Hematocrit: 26.4 % — ABNORMAL LOW (ref 34.0–46.6)
Hemoglobin: 8.2 g/dL — ABNORMAL LOW (ref 11.1–15.9)
Immature Grans (Abs): 0 10*3/uL (ref 0.0–0.1)
Immature Granulocytes: 0 %
Lymphocytes Absolute: 1.5 10*3/uL (ref 0.7–3.1)
Lymphs: 24 %
MCH: 24.7 pg — ABNORMAL LOW (ref 26.6–33.0)
MCHC: 31.1 g/dL — ABNORMAL LOW (ref 31.5–35.7)
MCV: 80 fL (ref 79–97)
Monocytes Absolute: 0.7 10*3/uL (ref 0.1–0.9)
Monocytes: 11 %
Neutrophils Absolute: 3.7 10*3/uL (ref 1.4–7.0)
Neutrophils: 60 %
Platelets: 429 10*3/uL (ref 150–450)
RBC: 3.32 x10E6/uL — ABNORMAL LOW (ref 3.77–5.28)
RDW: 13.5 % (ref 11.7–15.4)
WBC: 6.2 10*3/uL (ref 3.4–10.8)

## 2022-06-02 LAB — B12 AND FOLATE PANEL
Folate: >20 ng/mL (ref >3.0)
Vitamin B-12: 859 pg/mL (ref 232–1245)

## 2022-06-02 NOTE — Progress Notes (Signed)
Continue to recommend immediate follow up with GI given acute blood loss via colonoscopy.  Hemoglobin, Hematocrit are down trending. Red cells are showing changes as well.  Gwyneth Sprout, [address] [place], [place] [address] [phone] (phone) [phone] (fax) [place]

## 2022-06-06 ENCOUNTER — Encounter: Payer: Self-pay | Admitting: Gastroenterology

## 2022-06-13 ENCOUNTER — Other Ambulatory Visit: Payer: Self-pay

## 2022-06-13 ENCOUNTER — Encounter: Payer: Self-pay | Admitting: Surgery

## 2022-06-13 ENCOUNTER — Encounter: Payer: Self-pay | Admitting: Gastroenterology

## 2022-06-13 ENCOUNTER — Encounter
Admission: RE | Disposition: A | Discharge status: Home / Self Care | Payer: Self-pay | Source: Ambulatory Visit | Attending: Gastroenterology

## 2022-06-13 ENCOUNTER — Ambulatory Visit: Payer: Medicare HMO | Admitting: Anesthesiology

## 2022-06-13 ENCOUNTER — Ambulatory Visit
Admission: RE | Admit: 2022-06-13 | Discharge: 2022-06-13 | Disposition: A | Discharge status: Home / Self Care | Payer: Medicare HMO | Source: Ambulatory Visit | Attending: Gastroenterology | Admitting: Gastroenterology

## 2022-06-13 ENCOUNTER — Ambulatory Visit: Payer: Medicare HMO | Admitting: Surgery

## 2022-06-13 ENCOUNTER — Other Ambulatory Visit
Admission: RE | Admit: 2022-06-13 | Discharge: 2022-06-13 | Disposition: A | Discharge status: Home / Self Care | Payer: Medicare HMO | Source: Home / Self Care | Attending: Surgery | Admitting: Surgery

## 2022-06-13 VITALS — BP 172/81 | HR 89 | Temp 98.0°F | Ht 66.0 in | Wt 170.0 lb

## 2022-06-13 DIAGNOSIS — K641 Second degree hemorrhoids: Secondary | ICD-10-CM | POA: Diagnosis not present

## 2022-06-13 DIAGNOSIS — M1990 Unspecified osteoarthritis, unspecified site: Secondary | ICD-10-CM | POA: Insufficient documentation

## 2022-06-13 DIAGNOSIS — K5900 Constipation, unspecified: Secondary | ICD-10-CM

## 2022-06-13 DIAGNOSIS — G4730 Sleep apnea, unspecified: Secondary | ICD-10-CM | POA: Diagnosis not present

## 2022-06-13 DIAGNOSIS — Z86010 Personal history of colonic polyps: Secondary | ICD-10-CM | POA: Diagnosis not present

## 2022-06-13 DIAGNOSIS — D490 Neoplasm of unspecified behavior of digestive system: Secondary | ICD-10-CM

## 2022-06-13 DIAGNOSIS — K5730 Diverticulosis of large intestine without perforation or abscess without bleeding: Secondary | ICD-10-CM | POA: Diagnosis not present

## 2022-06-13 DIAGNOSIS — D509 Iron deficiency anemia, unspecified: Secondary | ICD-10-CM | POA: Insufficient documentation

## 2022-06-13 DIAGNOSIS — K6389 Other specified diseases of intestine: Secondary | ICD-10-CM | POA: Insufficient documentation

## 2022-06-13 DIAGNOSIS — Z8249 Family history of ischemic heart disease and other diseases of the circulatory system: Secondary | ICD-10-CM | POA: Diagnosis not present

## 2022-06-13 DIAGNOSIS — K449 Diaphragmatic hernia without obstruction or gangrene: Secondary | ICD-10-CM | POA: Diagnosis not present

## 2022-06-13 DIAGNOSIS — D500 Iron deficiency anemia secondary to blood loss (chronic): Secondary | ICD-10-CM | POA: Diagnosis not present

## 2022-06-13 DIAGNOSIS — C182 Malignant neoplasm of ascending colon: Secondary | ICD-10-CM | POA: Diagnosis not present

## 2022-06-13 DIAGNOSIS — I10 Essential (primary) hypertension: Secondary | ICD-10-CM | POA: Insufficient documentation

## 2022-06-13 HISTORY — PX: ESOPHAGOGASTRODUODENOSCOPY (EGD) WITH PROPOFOL: SHX5813

## 2022-06-13 HISTORY — DX: Sleep apnea, unspecified: G47.30

## 2022-06-13 HISTORY — PX: COLONOSCOPY WITH PROPOFOL: SHX5780

## 2022-06-13 LAB — COMPREHENSIVE METABOLIC PANEL WITH GFR
ALT: 39 U/L (ref 0–44)
AST: 33 U/L (ref 15–41)
Albumin: 4.4 g/dL (ref 3.5–5.0)
Alkaline Phosphatase: 51 U/L (ref 38–126)
Anion gap: 12 (ref 5–15)
BUN: 11 mg/dL (ref 8–23)
CO2: 24 mmol/L (ref 22–32)
Calcium: 9.4 mg/dL (ref 8.9–10.3)
Chloride: 99 mmol/L (ref 98–111)
Creatinine, Ser: 0.78 mg/dL (ref 0.44–1.00)
GFR, Estimated: >60 mL/min
Glucose, Bld: 115 mg/dL — ABNORMAL HIGH (ref 70–99)
Potassium: 3.4 mmol/L — ABNORMAL LOW (ref 3.5–5.1)
Sodium: 135 mmol/L (ref 135–145)
Total Bilirubin: 0.8 mg/dL (ref 0.3–1.2)
Total Protein: 7.6 g/dL (ref 6.5–8.1)

## 2022-06-13 LAB — CBC WITH DIFFERENTIAL/PLATELET
Abs Immature Granulocytes: 0.01 10*3/uL (ref 0.00–0.07)
Basophils Absolute: 0 10*3/uL (ref 0.0–0.1)
Basophils Relative: 1 %
Eosinophils Absolute: 0.2 10*3/uL (ref 0.0–0.5)
Eosinophils Relative: 3 %
HCT: 27.2 % — ABNORMAL LOW (ref 36.0–46.0)
Hemoglobin: 8.5 g/dL — ABNORMAL LOW (ref 12.0–15.0)
Immature Granulocytes: 0 %
Lymphocytes Relative: 28 %
Lymphs Abs: 1.9 10*3/uL (ref 0.7–4.0)
MCH: 23.5 pg — ABNORMAL LOW (ref 26.0–34.0)
MCHC: 31.3 g/dL (ref 30.0–36.0)
MCV: 75.3 fL — ABNORMAL LOW (ref 80.0–100.0)
Monocytes Absolute: 0.8 10*3/uL (ref 0.1–1.0)
Monocytes Relative: 12 %
Neutro Abs: 3.9 10*3/uL (ref 1.7–7.7)
Neutrophils Relative %: 56 %
Platelets: 424 10*3/uL — ABNORMAL HIGH (ref 150–400)
RBC: 3.61 MIL/uL — ABNORMAL LOW (ref 3.87–5.11)
RDW: 14 % (ref 11.5–15.5)
WBC: 6.9 10*3/uL (ref 4.0–10.5)
nRBC: 0 % (ref 0.0–0.2)

## 2022-06-13 LAB — PROTIME-INR
INR: 1.1 (ref 0.8–1.2)
Prothrombin Time: 13.8 s (ref 11.4–15.2)

## 2022-06-13 SURGERY — COLONOSCOPY WITH PROPOFOL
Anesthesia: General

## 2022-06-13 MED ORDER — SODIUM CHLORIDE 0.9 % IV SOLN: INTRAVENOUS | Status: DC

## 2022-06-13 MED ORDER — METRONIDAZOLE 500 MG PO TABS: ORAL_TABLET | ORAL | 0 refills | Status: DC

## 2022-06-13 MED ORDER — BISACODYL 5 MG PO TBEC: DELAYED_RELEASE_TABLET | ORAL | 0 refills | Status: DC

## 2022-06-13 MED ORDER — POLYETHYLENE GLYCOL 3350 17 GM/SCOOP PO POWD: ORAL | 0 refills | Status: DC

## 2022-06-13 MED ORDER — LACTATED RINGERS IV SOLN: INTRAVENOUS | Status: DC

## 2022-06-13 MED ORDER — NEOMYCIN SULFATE 500 MG PO TABS: ORAL_TABLET | ORAL | 0 refills | Status: DC

## 2022-06-13 MED ORDER — LIDOCAINE HCL (CARDIAC) PF 100 MG/5ML IV SOSY
PREFILLED_SYRINGE | INTRAVENOUS | Status: DC | PRN
  Administered 2022-06-13: 40 mg via INTRAVENOUS

## 2022-06-13 MED ORDER — STERILE WATER FOR IRRIGATION IR SOLN
Status: DC | PRN
  Administered 2022-06-13: 1000 mL

## 2022-06-13 MED ORDER — PROPOFOL 10 MG/ML IV BOLUS
INTRAVENOUS | Status: DC | PRN
  Administered 2022-06-13: 40 mg via INTRAVENOUS
  Administered 2022-06-13: 50 mg via INTRAVENOUS
  Administered 2022-06-13: 90 mg via INTRAVENOUS

## 2022-06-13 SURGICAL SUPPLY — 36 items
BALLN DILATOR 12-15 8 (BALLOONS)
BALLN DILATOR 15-18 8 (BALLOONS)
BALLN DILATOR CRE 0-12 8 (BALLOONS)
BALLN DILATOR ESOPH 8 10 CRE (MISCELLANEOUS) IMPLANT
BALLOON DILATOR 12-15 8 (BALLOONS) IMPLANT
BALLOON DILATOR 15-18 8 (BALLOONS) IMPLANT
BALLOON DILATOR CRE 0-12 8 (BALLOONS) IMPLANT
BLOCK BITE 60FR ADLT L/F GRN (MISCELLANEOUS) ×1 IMPLANT
CLIP HMST 235XBRD CATH ROT (MISCELLANEOUS) IMPLANT
CLIP RESOLUTION 360 11X235 (MISCELLANEOUS)
ELECT REM PT RETURN 9FT ADLT (ELECTROSURGICAL)
ELECTRODE REM PT RTRN 9FT ADLT (ELECTROSURGICAL) IMPLANT
FCP ESCP3.2XJMB 240X2.8X (MISCELLANEOUS)
FORCEPS BIOP RAD 4 LRG CAP 4 (CUTTING FORCEPS) IMPLANT
FORCEPS BIOP RJ4 240 W/NDL (MISCELLANEOUS)
FORCEPS ESCP3.2XJMB 240X2.8X (MISCELLANEOUS) IMPLANT
GOWN CVR UNV OPN BCK APRN NK (MISCELLANEOUS) ×2 IMPLANT
GOWN ISOL THUMB LOOP REG UNIV (MISCELLANEOUS) ×2
INJECTOR VARIJECT VIN23 (MISCELLANEOUS) IMPLANT
KIT DEFENDO VALVE AND CONN (KITS) IMPLANT
KIT PRC NS LF DISP ENDO (KITS) ×1 IMPLANT
KIT PROCEDURE OLYMPUS (KITS) ×1
MANIFOLD NEPTUNE II (INSTRUMENTS) ×1 IMPLANT
MARKER SPOT ENDO TATTOO 5ML (MISCELLANEOUS) IMPLANT
PROBE APC STR FIRE (PROBE) IMPLANT
RETRIEVER NET PLAT FOOD (MISCELLANEOUS) IMPLANT
RETRIEVER NET ROTH 2.5X230 LF (MISCELLANEOUS) IMPLANT
SNARE COLD EXACTO (MISCELLANEOUS) IMPLANT
SNARE SHORT THROW 13M SML OVAL (MISCELLANEOUS) IMPLANT
SNARE SHORT THROW 30M LRG OVAL (MISCELLANEOUS) IMPLANT
SNARE SNG USE RND 15MM (INSTRUMENTS) IMPLANT
SYR INFLATION 60ML (SYRINGE) IMPLANT
TRAP ETRAP POLY (MISCELLANEOUS) IMPLANT
VARIJECT INJECTOR VIN23 (MISCELLANEOUS) ×1
WATER STERILE IRR 250ML POUR (IV SOLUTION) ×1 IMPLANT
WIRE CRE 18-20MM 8CM F G (MISCELLANEOUS) IMPLANT

## 2022-06-13 NOTE — Anesthesia Preprocedure Evaluation (Signed)
Anesthesia Evaluation  Patient identified by MRN, date of birth, ID band Patient awake    Reviewed: Allergy & Precautions, NPO status , Patient's Chart, lab work & pertinent test results  History of Anesthesia Complications (+) PONV and history of anesthetic complications  Airway Mallampati: II  TM Distance: >3 FB Neck ROM: full    Dental  (+) Teeth Intact   Pulmonary sleep apnea and Continuous Positive Airway Pressure Ventilation    Pulmonary exam normal        Cardiovascular hypertension, On Medications negative cardio ROS Normal cardiovascular exam     Neuro/Psych  PSYCHIATRIC DISORDERS Anxiety     negative neurological ROS     GI/Hepatic negative GI ROS, Neg liver ROS,,,  Endo/Other  negative endocrine ROS    Renal/GU negative Renal ROS  negative genitourinary   Musculoskeletal  (+) Arthritis ,    Abdominal   Peds  Hematology negative hematology ROS (+)   Anesthesia Other Findings Past Medical History: No date: Arthritis     Comment:  fingers No date: Cancer ([place])     Comment:  basal cell No date: Hypertension No date: PONV (postoperative nausea and vomiting)     Comment:  after hysterectomy No date: Sleep apnea     Comment:  CPAP  Past Surgical History: No date: ABDOMINAL HYSTERECTOMY 1996: BREAST CYST EXCISION; Right     Comment:  cyst removed-neg No date: CATARACT EXTRACTION 03/02/2017: COLONOSCOPY WITH PROPOFOL; N/A     Comment:  Procedure: COLONOSCOPY WITH PROPOFOL;  Surgeon: Lucilla Lame, MD;  Location: [facility];  Service:               Endoscopy;  Laterality: N/A; 03/02/2017: POLYPECTOMY; N/A     Comment:  Procedure: POLYPECTOMY;  Surgeon: Lucilla Lame, MD;                Location: [facility];  Service: Endoscopy;                Laterality: N/A;  BMI    Body Mass Index: 27.60 kg/m      Reproductive/Obstetrics negative OB ROS                              Anesthesia Physical Anesthesia Plan  ASA: 2  Anesthesia Plan: General   Post-op Pain Management: Minimal or no pain anticipated   Induction: Intravenous  PONV Risk Score and Plan: Propofol infusion and TIVA  Airway Management Planned: Natural Airway and Nasal Cannula  Additional Equipment:   Intra-op Plan:   Post-operative Plan:   Informed Consent: I have reviewed the patients History and Physical, chart, labs and discussed the procedure including the risks, benefits and alternatives for the proposed anesthesia with the patient or authorized representative who has indicated his/her understanding and acceptance.     Dental Advisory Given  Plan Discussed with: Anesthesiologist, CRNA and Surgeon  Anesthesia Plan Comments: (Patient consented for risks of anesthesia including but not limited to:  - adverse reactions to medications - risk of airway placement if required - damage to eyes, teeth, lips or other oral mucosa - nerve damage due to positioning  - sore throat or hoarseness - Damage to heart, brain, nerves, lungs, other parts of body or loss of life  Patient voiced understanding.)       Anesthesia Quick Evaluation

## 2022-06-13 NOTE — Patient Instructions (Addendum)
Go to [facility] [place] entrance to have your blood work done today.  Your CT scan is scheduled at [facility] on Tuesday November 28th. You will need to arrive there by 10:45 am and have nothing to eat or drink for 4 hours prior. We will have you follow up here after we get the results from these tests.   We have discussed removing a portion of your damaged colon through 4 small incisions today. We have scheduled this surgery for 07/05/22 at [facility] with Dr. Dahlia Byes. Please plan a hospital stay of 3-7 days for surgery and recovery time.  We will have you complete a bowel prep prior to your surgery. Please see information provided.  You have also been given a (Blue) Pre-Care Sheet with more information regarding your particular surgery. Our surgery scheduler will call you to verify surgery date and to go over information.  Please review all information given.  You will need to arrange to be out of work for approximately 2 weeks and then you may return with a lifting restriction for 4 more weeks. If you have FMLA or Disability paperwork that needs to be filled out, please have your company fax your paperwork to [phone] or you may drop this by either office. This paperwork will be filled out within 3 days after your surgery has been completed.  Please call our office with any questions or concerns prior to your scheduled surgery.   Laparoscopic Colectomy Laparoscopic colectomy is surgery to remove part or all of the large intestine (colon). This procedure may be used to treat several conditions, including: Inflammation and infection of the colon (diverticulitis). Tumors or masses in the colon. Inflammatory bowel disease, such as Crohn disease or ulcerative colitis. Colectomy is an option when symptoms cannot be controlled with medicines. Bleeding from the colon that cannot be controlled by another method. Blockage or obstruction of the colon.  Tell a health care provider about: Any  allergies you have. All medicines you are taking, including vitamins, herbs, eye drops, creams, and over-the-counter medicines. Any problems you or family members have had with anesthetic medicines. Any blood disorders you have. Any surgeries you have had. Any medical conditions you have. What are the risks? Generally, this is a safe procedure. However, problems may occur, including: Infection. Bleeding. Allergic reactions to medicines or dyes. Damage to other structures or organs. Leaking from where the colon was sewn together. Future blockage of the small intestines from scar tissue. Another surgery may be needed to repair this. Needing to convert to an open procedure. Complications such as damage to other organs or excessive bleeding may require the surgeon to convert from a laparoscopic procedure to an open procedure. This involves making a larger incision in the abdomen.  What happens before the procedure? Staying hydrated Follow instructions from your health care provider about hydration, which may include: Up to 2 hours before the procedure - you may continue to drink clear liquids, such as water, clear fruit juice, black coffee, and plain tea.  Eating and drinking restrictions Follow instructions from your health care provider about eating and drinking, which may include: 8 hours before the procedure - stop eating heavy meals, meals with high fiber, or foods such as meat, fried foods, or fatty foods. 6 hours before the procedure - stop eating light meals or foods, such as toast or cereal. 6 hours before the procedure - stop drinking milk or drinks that contain milk. 2 hours before the procedure - stop  drinking clear liquids.  Medicines Ask your health care provider about: Changing or stopping your regular medicines. This is especially important if you are taking diabetes medicines or blood thinners. Taking medicines such as aspirin and ibuprofen. These medicines can thin your  blood. Do not take these medicines before your procedure if your health care provider instructs you not to. You may be given antibiotic medicine to clean out bacteria from your colon. Follow the directions carefully and take the medicine at the correct time. General instructions You may be prescribed an oral bowel prep to clean out your colon in preparation for the surgery: Follow instructions from your health care provider about how to do this. Do not eat or drink anything else after you have started the bowel prep, unless your health care provider tells you it is safe to do so. Do not use any products that contain nicotine or tobacco, such as cigarettes and e-cigarettes. If you need help quitting, ask your health care provider. What happens during the procedure? To reduce your risk of infection: Your health care team will wash or sanitize their hands. Your skin will be washed with soap. An IV tube will be inserted into one of your veins to deliver fluid and medication. You will be given one of the following: A medicine to help you relax (sedative). A medicine to make you fall asleep (general anesthetic). Small monitors will be connected to your body. They will be used to check your heart, blood pressure, and oxygen level. A breathing tube may be placed into your lungs during the procedure. A thin, flexible tube (catheter) will be placed into your bladder to drain urine. A tube may be placed through your nose and into your stomach to drain stomach fluids (nasogastric tube, or NG tube). Your abdomen will be filled with air so it expands. This gives the surgeon more room to operate and makes your organs easier to see. Several small cuts (incisions) will be made in your abdomen. A thin, lighted tube with a tiny camera on the end (laparoscope) will be put through one of the small incisions. The camera on the laparoscope will send a picture to a computer screen in the operating room. This will give  the surgeon a good view inside your abdomen. Hollow tubes will be put through the other small incisions in your abdomen. The tools that are needed for the procedure will be put through these tubes. Clamps or staples will be put on both ends of the diseased part of the colon. The part of the intestine between the clamps or staples will be removed. If possible, the ends of the healthy colon that remain will be stitched (sutured) or stapled together to allow your body to pass waste (stool). Sometimes, the remaining colon cannot be stitched back together. If this is the case, a colostomy will be needed. If you need a colostomy: An opening to the outside of your body (stoma) will be made through your abdomen. The end of your colon will be brought to the opening. It will be stitched to the skin. A bag will be attached to the opening. Stool will drain into this removable bag. The colostomy may be temporary or permanent. The incisions from the colectomy will be closed with sutures or staples. The procedure may vary among health care providers and hospitals. What happens after the procedure? Your blood pressure, heart rate, breathing rate, and blood oxygen level will be monitored until the medicines you were given have worn  off. You will receive fluids through an IV tube until your bowels start to work properly. Once your bowels are working again, you will be given clear liquids first and then solid food as tolerated. You will be given medicines to control your pain and nausea, if needed. Do not drive for 24 hours if you were given a sedative. This information is not intended to replace advice given to you by your health care provider. Make sure you discuss any questions you have with your health care provider. Document Released: 10/01/2002 Document Revised: 04/11/2016 Document Reviewed: 04/11/2016 Elsevier Interactive Patient Education  2018 [employer].     Laparoscopic Colectomy, Care  After This sheet gives you information about how to care for yourself after your procedure. Your health care provider may also give you more specific instructions. If you have problems or questions, contact your health care provider. What can I expect after the procedure? After your procedure, it is common to have the following: Pain in your abdomen, especially in the incision areas. You will be given medicine to control the pain. Tiredness. This is a normal part of the recovery process. Your energy level will return to normal over the next several weeks. Changes in your bowel movements, such as constipation or needing to go more often. Talk with your health care provider about how to manage this.  Follow these instructions at home: Medicines Take over-the-counter and prescription medicines only as told by your health care provider. Do not drive or use heavy machinery while taking prescription pain medicine. Do not drink alcohol while taking prescription pain medicine. If you were prescribed an antibiotic medicine, use it as told by your health care provider. Do not stop using the antibiotic even if you start to feel better. Incision care Follow instructions from your health care provider about how to take care of your incision areas. Make sure you: Keep your incisions clean and dry. Wash your hands with soap and water before and after applying medicine to the areas, and before and after changing your bandage (dressing). If soap and water are not available, use hand sanitizer. Change your dressing as told by your health care provider. Leave stitches (sutures), skin glue, or adhesive strips in place. These skin closures may need to stay in place for 2 weeks or longer. If adhesive strip edges start to loosen and curl up, you may trim the loose edges. Do not remove adhesive strips completely unless your health care provider tells you to do that. Do not wear tight clothing over the incisions. Tight  clothing may rub and irritate the incision areas, which may cause the incisions to open. Do not take baths, swim, or use a hot tub until your health care provider approves. Ask your health care provider if you can take showers. You may only be allowed to take sponge baths for bathing. Check your incision area every day for signs of infection. Check for: More redness, swelling, or pain. More fluid or blood. Warmth. Pus or a bad smell. Activity Avoid lifting anything that is heavier than 10 lb (4.5 kg) for 2 weeks or until your health care provider says it is okay. You may resume normal activities as told by your health care provider. Ask your health care provider what activities are safe for you. Take rest breaks during the day as needed. Eating and drinking Follow instructions from your health care provider about what you can eat after surgery. To prevent or treat constipation while you are taking  prescription pain medicine, your health care provider may recommend that you: Drink enough fluid to keep your urine clear or pale yellow. Take over-the-counter or prescription medicines. Eat foods that are high in fiber, such as fresh fruits and vegetables, whole grains, and beans. Limit foods that are high in fat and processed sugars, such as fried and sweet foods. General instructions Ask your health care provider when you will need an appointment to get your sutures or staples removed. Keep all follow-up visits as told by your health care provider. This is important. Contact a health care provider if: You have more redness, swelling, or pain around your incisions. You have more fluid or blood coming from the incisions. Your incisions feel warm to the touch. You have pus or a bad smell coming from your incisions or your dressing. You have a fever. You have an incision that breaks open (edges not staying together) after sutures or staples have been removed. Get help right away if: You develop  a rash. You have chest pain or difficulty breathing. You have pain or swelling in your legs. You feel light-headed or you faint. Your abdomen swells (becomes distended). You have nausea or vomiting. You have blood in your stool (feces). This information is not intended to replace advice given to you by your health care provider. Make sure you discuss any questions you have with your health care provider. Document Released: 01/28/2005 Document Revised: 04/11/2016 Document Reviewed: 04/11/2016 Elsevier Interactive Patient Education  [employer].

## 2022-06-13 NOTE — Transfer of Care (Signed)
Immediate Anesthesia Transfer of Care Note  Patient: Cindy Vega  Procedure(s) Performed: COLONOSCOPY WITH PROPOFOL WITH BIOPSY ESOPHAGOGASTRODUODENOSCOPY (EGD) WITH PROPOFOL  Patient Location: PACU  Anesthesia Type: General  Level of Consciousness: awake, alert  and patient cooperative  Airway and Oxygen Therapy: Patient Spontanous Breathing and Patient connected to supplemental oxygen  Post-op Assessment: Post-op Vital signs reviewed, Patient's Cardiovascular Status Stable, Respiratory Function Stable, Patent Airway and No signs of Nausea or vomiting  Post-op Vital Signs: Reviewed and stable  Complications: No notable events documented.

## 2022-06-13 NOTE — Op Note (Signed)
[facility] Gastroenterology Patient Name: Cindy Vega Procedure Date: 06/13/2022 9:50 AM MRN: [id number] Account #: [id number] Date of Birth: September 03, 1945 Admit Type: Outpatient Age: 76 Room: [facility] OR ROOM 01 Gender: Female Note Status: Finalized Instrument Name: [id number] Procedure:             Upper GI endoscopy Indications:           Iron deficiency anemia Providers:             Lucilla Lame MD, MD Referring MD:          Jaci Standard. Rollene Rotunda (Referring MD) Medicines:             Propofol per Anesthesia Complications:         No immediate complications. Procedure:             Pre-Anesthesia Assessment:                        - Prior to the procedure, a History and Physical was                         performed, and patient medications and allergies were                         reviewed. The patient's tolerance of previous                         anesthesia was also reviewed. The risks and benefits                         of the procedure and the sedation options and risks                         were discussed with the patient. All questions were                         answered, and informed consent was obtained. Prior                         Anticoagulants: The patient has taken no anticoagulant                         or antiplatelet agents. ASA Grade Assessment: II - A                         patient with mild systemic disease. After reviewing                         the risks and benefits, the patient was deemed in                         satisfactory condition to undergo the procedure.                        After obtaining informed consent, the endoscope was                         passed under direct vision. Throughout the procedure,  the patient's blood pressure, pulse, and oxygen                         saturations were monitored continuously. The was                         introduced through the mouth, and advanced to the                          second part of duodenum. The upper GI endoscopy was                         accomplished without difficulty. The patient tolerated                         the procedure well. Findings:      A small hiatal hernia was present.      The entire examined stomach was normal.      The examined duodenum was normal. Impression:            - Small hiatal hernia.                        - Normal stomach.                        - Normal examined duodenum.                        - No specimens collected. Recommendation:        - Discharge patient to home.                        - Resume previous diet.                        - Continue present medications.                        - Perform a colonoscopy today. Procedure Code(s):     --- Professional ---                        [phone], Esophagogastroduodenoscopy, flexible,                         transoral; diagnostic, including collection of                         specimen(s) by brushing or washing, when performed                         (separate procedure) Diagnosis Code(s):     --- Professional ---                        D50.9, Iron deficiency anemia, unspecified CPT copyright 2022 American Medical Association. All rights reserved. The codes documented in this report are preliminary and upon coder review may  be revised to meet current compliance requirements. Lucilla Lame MD, MD 06/13/2022 10:03:35 AM This report has been signed electronically. Number of Addenda: 0 Note Initiated On: 06/13/2022 9:50 AM Total Procedure Duration: 0 hours  2 minutes 1 second  Estimated Blood Loss:  Estimated blood loss: none.      [facility]

## 2022-06-13 NOTE — H&P (Signed)
Lucilla Lame, MD [facility] [address] [place], [place] [address] [phone] Fax : [phone]  Primary Care Physician:  Gwyneth Sprout, FNP Primary Gastroenterologist:  Dr. Allen Norris  Pre-Procedure History & Physical: HPI:  Cindy Vega is a 76 y.o. female is here for an endoscopy and colonoscopy.   Past Medical History:  Diagnosis Date   Arthritis    fingers   Cancer ([place])    basal cell   Hypertension    PONV (postoperative nausea and vomiting)    after hysterectomy   Sleep apnea    CPAP    Past Surgical History:  Procedure Laterality Date   ABDOMINAL HYSTERECTOMY     BREAST CYST EXCISION Right 1996   cyst removed-neg   CATARACT EXTRACTION     COLONOSCOPY WITH PROPOFOL N/A 03/02/2017   Procedure: COLONOSCOPY WITH PROPOFOL;  Surgeon: Lucilla Lame, MD;  Location: [place];  Service: Endoscopy;  Laterality: N/A;   POLYPECTOMY N/A 03/02/2017   Procedure: POLYPECTOMY;  Surgeon: Lucilla Lame, MD;  Location: [place];  Service: Endoscopy;  Laterality: N/A;    Prior to Admission medications   Medication Sig Start Date End Date Taking? Authorizing Provider  aspirin 81 MG tablet Take by mouth every other day.  11/15/07  Yes [provider]  atorvastatin (LIPITOR) 20 MG tablet TAKE 1 TABLET EVERY DAY 12/28/21  Yes Gwyneth Sprout, FNP  Biotin 5 MG CAPS daily.  01/19/09  Yes [provider]  cholecalciferol (VITAMIN D) 1000 units tablet Take 1,000 Units by mouth daily.  05/12/10  Yes [provider]  latanoprost (XALATAN) 0.005 % ophthalmic solution 1 drop at bedtime.   Yes [provider]  MULTIPLE VITAMIN PO Take by mouth daily.  05/12/10  Yes [provider]  Omega-3 Fatty Acids (FISH OIL) 1000 MG CAPS daily.  11/22/07  Yes [provider]  psyllium (METAMUCIL) 58.6 % packet Take 1 packet by mouth daily.   Yes [provider]  triamterene-hydrochlorothiazide (MAXZIDE-25) 37.5-25 MG  tablet TAKE 1 TABLET EVERY DAY 03/07/22  Yes Tally Joe T, FNP    Allergies as of 06/01/2022 - Review Complete 06/01/2022  Allergen Reaction Noted   Silver nitrate  09/02/2015    Family History  Problem Relation Age of Onset   Hypertension Mother    Heart disease Mother    Diabetes Mother    Diabetes Father    Prostate cancer Father    Breast cancer Sister 24   Graves' disease Sister    Breast cancer Sister     Social History   Socioeconomic History   Marital status: Married    Spouse name: Not on file   Number of children: 2   Years of education: Not on file   Highest education level: Bachelor's degree (e.g., BA, AB, BS)  Occupational History   Occupation: retired  Tobacco Use   Smoking status: Never   Smokeless tobacco: Never  Vaping Use   Vaping Use: Never used  Substance and Sexual Activity   Alcohol use: Yes    Alcohol/week: 3.0 standard drinks of alcohol    Types: 3 Glasses of wine per week   Drug use: No   Sexual activity: Not on file  Other Topics Concern   Not on file  Social History Narrative   Not on file   Social Determinants of Health   Financial Resource Strain: Low Risk  (04/26/2022)   Overall Financial Resource Strain (CARDIA)    Difficulty of  Paying Living Expenses: Not hard at all  Food Insecurity: No Food Insecurity (04/26/2022)   Hunger Vital Sign    Worried About Running Out of Food in the Last Year: Never true    Ran Out of Food in the Last Year: Never true  Transportation Needs: No Transportation Needs (04/26/2022)   PRAPARE - Hydrologist (Medical): No    Lack of Transportation (Non-Medical): No  Physical Activity: Insufficiently Active (04/26/2022)   Exercise Vital Sign    Days of Exercise per Week: 2 days    Minutes of Exercise per Session: 60 min  Stress: No Stress Concern Present (04/26/2022)   [place]    Feeling of Stress : Not at  all  Social Connections: Moderately Integrated (04/26/2022)   Social Connection and Isolation Panel [NHANES]    Frequency of Communication with Friends and Family: More than three times a week    Frequency of Social Gatherings with Friends and Family: More than three times a week    Attends Religious Services: More than 4 times per year    Active Member of [employer] or Organizations: No    Attends Archivist Meetings: Never    Marital Status: Married  Human resources officer Violence: Not At Risk (04/26/2022)   Humiliation, Afraid, Rape, and Kick questionnaire    Fear of Current or Ex-Partner: No    Emotionally Abused: No    Physically Abused: No    Sexually Abused: No    Review of Systems: See HPI, otherwise negative ROS  Physical Exam: BP (!) 148/70   Temp 98.1 F (36.7 C) (Temporal)   Resp 16   Ht '5\' 6"'$  (1.676 m)   Wt 76.7 kg   SpO2 98%   BMI 27.28 kg/m  General:   Alert,  pleasant and cooperative in NAD Head:  Normocephalic and atraumatic. Neck:  Supple; no masses or thyromegaly. Lungs:  Clear throughout to auscultation.    Heart:  Regular rate and rhythm. Abdomen:  Soft, nontender and nondistended. Normal bowel sounds, without guarding, and without rebound.   Neurologic:  Alert and  oriented x4;  grossly normal neurologically.  Impression/Plan: Cindy Vega is here for an endoscopy and colonoscopy to be performed for IDA  Risks, benefits, limitations, and alternatives regarding  endoscopy and colonoscopy have been reviewed with the patient.  Questions have been answered.  All parties agreeable.   Lucilla Lame, MD  06/13/2022, 9:49 AM

## 2022-06-13 NOTE — Op Note (Signed)
[facility] Gastroenterology Patient Name: Cindy Vega Procedure Date: 06/13/2022 9:49 AM MRN: [id number] Account #: [id number] Date of Birth: 05-07-1946 Admit Type: Outpatient Age: 76 Room: [facility] OR ROOM 01 Gender: Female Note Status: Finalized Instrument Name: [id number] Procedure:             Colonoscopy Indications:           Iron deficiency anemia Providers:             Lucilla Lame MD, MD Referring MD:          Jaci Standard. Rollene Rotunda (Referring MD) Medicines:             Propofol per Anesthesia Complications:         No immediate complications. Procedure:             Pre-Anesthesia Assessment:                        - Prior to the procedure, a History and Physical was                         performed, and patient medications and allergies were                         reviewed. The patient's tolerance of previous                         anesthesia was also reviewed. The risks and benefits                         of the procedure and the sedation options and risks                         were discussed with the patient. All questions were                         answered, and informed consent was obtained. Prior                         Anticoagulants: The patient has taken no anticoagulant                         or antiplatelet agents. ASA Grade Assessment: II - A                         patient with mild systemic disease. After reviewing                         the risks and benefits, the patient was deemed in                         satisfactory condition to undergo the procedure.                        After obtaining informed consent, the colonoscope was                         passed under direct vision. Throughout the procedure,  the patient's blood pressure, pulse, and oxygen                         saturations were monitored continuously. The                         Colonoscope was introduced through the anus and                          advanced to the the cecum, identified by appendiceal                         orifice and ileocecal valve. The colonoscopy was                         performed without difficulty. The patient tolerated                         the procedure well. The quality of the bowel                         preparation was excellent. Findings:      The perianal and digital rectal examinations were normal.      A sessile non-obstructing mass was found in the proximal ascending       colon. The mass was non-circumferential. Oozing was present. This was       biopsied with a cold forceps for histology. Area was tattooed with an       injection of [place] ink.      A few small-mouthed diverticula were found in the sigmoid colon.      Non-bleeding internal hemorrhoids were found during retroflexion. The       hemorrhoids were Grade II (internal hemorrhoids that prolapse but reduce       spontaneously). Impression:            - Likely malignant tumor in the proximal ascending                         colon. Biopsied. Tattooed.                        - Diverticulosis in the sigmoid colon.                        - Non-bleeding internal hemorrhoids. Recommendation:        - Discharge patient to home.                        - Resume previous diet.                        - Continue present medications.                        - Await pathology results. Procedure Code(s):     --- Professional ---                        [phone], Colonoscopy, flexible; with biopsy, single or  multiple                        [id number], Colonoscopy, flexible; with directed submucosal                         injection(s), any substance Diagnosis Code(s):     --- Professional ---                        D50.9, Iron deficiency anemia, unspecified                        D49.0, Neoplasm of unspecified behavior of digestive                         system CPT copyright 2022 American Medical Association. All rights  reserved. The codes documented in this report are preliminary and upon coder review may  be revised to meet current compliance requirements. Lucilla Lame MD, MD 06/13/2022 10:19:07 AM This report has been signed electronically. Number of Addenda: 0 Note Initiated On: 06/13/2022 9:49 AM Scope Withdrawal Time: 0 hours 8 minutes 19 seconds  Total Procedure Duration: 0 hours 11 minutes 8 seconds  Estimated Blood Loss:  Estimated blood loss: none. Estimated blood loss: none.      [facility]

## 2022-06-13 NOTE — Anesthesia Postprocedure Evaluation (Signed)
Anesthesia Post Note  Patient: CARRON MCMURRY  Procedure(s) Performed: COLONOSCOPY WITH PROPOFOL WITH BIOPSY ESOPHAGOGASTRODUODENOSCOPY (EGD) WITH PROPOFOL  Patient location during evaluation: PACU Anesthesia Type: General Level of consciousness: awake and alert Pain management: pain level controlled Vital Signs Assessment: post-procedure vital signs reviewed and stable Respiratory status: spontaneous breathing, nonlabored ventilation, respiratory function stable and patient connected to nasal cannula oxygen Cardiovascular status: blood pressure returned to baseline and stable Postop Assessment: no apparent nausea or vomiting Anesthetic complications: no   No notable events documented.   Last Vitals:  Vitals:   06/13/22 1019 06/13/22 1027  BP: (!) 109/57 125/77  Pulse: 87 86  Resp: 14 14  Temp: (!) 36.1 C (!) 36.1 C  SpO2: 99% 99%    Last Pain:  Vitals:   06/13/22 1027  TempSrc:   PainSc: 0-No pain                 Ilene Qua

## 2022-06-14 ENCOUNTER — Telehealth: Payer: Self-pay | Admitting: Surgery

## 2022-06-14 ENCOUNTER — Encounter: Payer: Self-pay | Admitting: Gastroenterology

## 2022-06-14 ENCOUNTER — Telehealth: Payer: Self-pay

## 2022-06-14 LAB — CEA: CEA: 4.8 ng/mL — ABNORMAL HIGH (ref 0.0–4.7)

## 2022-06-14 NOTE — Telephone Encounter (Signed)
Patient has been advised of Pre-Admission date/time, and Surgery date at [facility].  Surgery Date: 07/05/22 Preadmission Testing Date: 06/28/22 (phone 8a-1p)  Patient has been made aware to call [phone], between 1-3:00pm the day before surgery, to find out what time to arrive for surgery.

## 2022-06-14 NOTE — Progress Notes (Signed)
Patient ID: Cindy Vega, female   DOB: 1946/06/13, 76 y.o.   MRN: [id number]  HPI Cindy Vega is a 76 y.o. female seen in consultation at the request of Dr.Wohl.  She did get a colonoscopy today for anemia workup.  Please note that I have personally reviewed the endoscopic images and there is an ascending mass encompassing a third of the circumference.  It is not obstructive.  It is not actively bleeding. She denies any history of colorectal cancer.  Is able to perform more than 4 METS of activity without any shortness of breath or chest pain.  Recently she does endorse being more tired than usual.  She did have colonoscopy 5 years ago and at that time there was a small 3 mm polyp. Hemoglobin is 8.6 rest of the CBC and BMP is normal.  Also complete abnormal mammogram that I have personally reviewed.  He did have a history of hysterectomy in the past Denies any weight loss any hematochezia or melena. Pathology is pending at this time HPI  Past Medical History:  Diagnosis Date   Arthritis    fingers   Cancer ([place])    basal cell   Hypertension    PONV (postoperative nausea and vomiting)    after hysterectomy   Sleep apnea    CPAP    Past Surgical History:  Procedure Laterality Date   ABDOMINAL HYSTERECTOMY     BREAST CYST EXCISION Right 1996   cyst removed-neg   CATARACT EXTRACTION     COLONOSCOPY WITH PROPOFOL N/A 03/02/2017   Procedure: COLONOSCOPY WITH PROPOFOL;  Surgeon: Lucilla Lame, MD;  Location: [place];  Service: Endoscopy;  Laterality: N/A;   COLONOSCOPY WITH PROPOFOL N/A 06/13/2022   Procedure: COLONOSCOPY WITH PROPOFOL WITH BIOPSY;  Surgeon: Lucilla Lame, MD;  Location: [employer];  Service: Endoscopy;  Laterality: N/A;  SPOT USED TO MARK ASCENDING COLON POLYP   ESOPHAGOGASTRODUODENOSCOPY (EGD) WITH PROPOFOL N/A 06/13/2022   Procedure: ESOPHAGOGASTRODUODENOSCOPY (EGD) WITH PROPOFOL;  Surgeon: Lucilla Lame, MD;  Location: [place];   Service: Endoscopy;  Laterality: N/A;  Sleep apnea   POLYPECTOMY N/A 03/02/2017   Procedure: POLYPECTOMY;  Surgeon: Lucilla Lame, MD;  Location: [place];  Service: Endoscopy;  Laterality: N/A;    Family History  Problem Relation Age of Onset   Hypertension Mother    Heart disease Mother    Diabetes Mother    Diabetes Father    Prostate cancer Father    Breast cancer Sister 66   Graves' disease Sister    Breast cancer Sister     Social History Social History   Tobacco Use   Smoking status: Never   Smokeless tobacco: Never  Vaping Use   Vaping Use: Never used  Substance Use Topics   Alcohol use: Yes    Alcohol/week: 3.0 standard drinks of alcohol    Types: 3 Glasses of wine per week   Drug use: No    Allergies  Allergen Reactions   Silver Nitrate     LIQUID NITRATE used to remove warts    Current Outpatient Medications  Medication Sig Dispense Refill   aspirin 81 MG tablet Take by mouth every other day.      atorvastatin (LIPITOR) 20 MG tablet TAKE 1 TABLET EVERY DAY 90 tablet 3   Biotin 5 MG CAPS daily.      bisacodyl (DULCOLAX) 5 MG EC tablet Take all 4 tablets at 8 am the morning prior to your surgery.  4 tablet 0   cholecalciferol (VITAMIN D) 1000 units tablet Take 1,000 Units by mouth daily.      latanoprost (XALATAN) 0.005 % ophthalmic solution 1 drop at bedtime.     metroNIDAZOLE (FLAGYL) 500 MG tablet Take 2 tablets at 8AM, take 2 tablets at [facility], and take 2 tablets at 8PM the day prior to your surgery 6 tablet 0   MULTIPLE VITAMIN PO Take by mouth daily.      neomycin (MYCIFRADIN) 500 MG tablet Take 2 tablet at 8am, take 2 tablets at 2pm, and take 2 tablets at 8pm the day prior to your surgery 6 tablet 0   Omega-3 Fatty Acids (FISH OIL) 1000 MG CAPS daily.      polyethylene glycol powder (MIRALAX) 17 GM/SCOOP powder Mix full container in 64 ounces of Gatorade or other clear liquid. NO Red 238 g 0   psyllium (METAMUCIL) 58.6 % packet Take 1 packet by  mouth daily.     triamterene-hydrochlorothiazide (MAXZIDE-25) 37.5-25 MG tablet TAKE 1 TABLET EVERY DAY 90 tablet 0   No current facility-administered medications for this visit.     Review of Systems Full ROS  was asked and was negative except for the information on the HPI  Physical Exam Blood pressure (!) 172/81, pulse 89, temperature 98 F (36.7 C), height '5\' 6"'$  (1.676 m), weight 170 lb (77.1 kg), SpO2 98 %. CONSTITUTIONAL: NAD. EYES: Pupils are equal, round, Sclera are non-icteric. EARS, NOSE, MOUTH AND THROAT: T The oral mucosa is pink and moist. Hearing is intact to voice. LYMPH NODES:  Lymph nodes in the neck are normal. RESPIRATORY:  Lungs are clear. There is normal respiratory effort, with equal breath sounds bilaterally, and without pathologic use of accessory muscles. CARDIOVASCULAR: Heart is regular without murmurs, gallops, or rubs. GI: The abdomen is  soft, nontender, and nondistended. There are no palpable masses. There is no hepatosplenomegaly. There are normal bowel sounds in all quadrants. GU: Rectal deferred.   MUSCULOSKELETAL: Normal muscle strength and tone. No cyanosis or edema.   SKIN: Turgor is good and there are no pathologic skin lesions or ulcers. NEUROLOGIC: Motor and sensation is grossly normal. Cranial nerves are grossly intact. PSYCH:  Oriented to person, place and time. Affect is normal.  Data Reviewed  I have personally reviewed the patient's imaging, laboratory findings and medical records.    Assessment/Plan   76 year old unresectable ascending colon mass likely precancerous versus adenocarcinoma.  Pathology is pending at this time.  I do think regardless of pathological examination she will need a completion right colectomy.  I would like to start staging with a CT scan of the abdomen pelvis as well as repeat labs with a CBC INR and CEA.  Have an upcoming trip and wishes to complete her workup and wishes to have her surgery after she comes from that  vacation.  I do think that this is fine as it is not going to change management. An extensive conversation with the patient and the family regarding her disease process and about the need for colectomy.  We did describe the operative course the risks and the benefits and the possible complications including hospital course and potential needs.  She understands and she is in agreement.  I do think that she will be a good candidate for laparoscopic hand-assisted colectomy.I will see her back once she completes her CT I spent 60 minutes in this encounter including personally reviewing imaging studies, records, coordinating his care, placing orders and performing appropriate  documentation.      Caroleen Hamman, MD FACS General Surgeon 06/14/2022, 1:26 PM

## 2022-06-14 NOTE — Telephone Encounter (Signed)
Per Dr.Pabon CEA mildly elevated will await results from CT- other labs ok- Keep scheduled appointment with Dr.Pabon.

## 2022-06-15 LAB — SURGICAL PATHOLOGY

## 2022-06-21 ENCOUNTER — Ambulatory Visit
Admission: RE | Admit: 2022-06-21 | Discharge: 2022-06-21 | Disposition: A | Discharge status: Home / Self Care | Payer: Medicare HMO | Source: Ambulatory Visit | Attending: Surgery | Admitting: Surgery

## 2022-06-21 DIAGNOSIS — K5900 Constipation, unspecified: Secondary | ICD-10-CM | POA: Insufficient documentation

## 2022-06-21 DIAGNOSIS — K6389 Other specified diseases of intestine: Secondary | ICD-10-CM | POA: Diagnosis not present

## 2022-06-21 DIAGNOSIS — K760 Fatty (change of) liver, not elsewhere classified: Secondary | ICD-10-CM | POA: Diagnosis not present

## 2022-06-21 DIAGNOSIS — K8020 Calculus of gallbladder without cholecystitis without obstruction: Secondary | ICD-10-CM | POA: Diagnosis not present

## 2022-06-21 MED ORDER — IOHEXOL 300 MG/ML  SOLN
100.0000 mL | Freq: Once | INTRAMUSCULAR | Status: AC | PRN
  Administered 2022-06-21: 100 mL via INTRAVENOUS

## 2022-06-22 ENCOUNTER — Ambulatory Visit: Payer: Medicare HMO | Admitting: Gastroenterology

## 2022-06-22 ENCOUNTER — Telehealth: Payer: Self-pay

## 2022-06-22 NOTE — Telephone Encounter (Signed)
-----   Message from Jules Husbands, MD sent at 06/22/2022  4:40 PM EST ----- Please let her know that there are no known surprises on CT ----- Message ----- From: Interface, Rad Results In Sent: 06/22/2022   3:13 PM EST To: Jules Husbands, MD

## 2022-06-22 NOTE — Telephone Encounter (Signed)
Notified patient as instructed, patient pleased. Discussed follow-up appointments, patient agrees  

## 2022-06-27 NOTE — Addendum Note (Signed)
Addended by: Caroleen Hamman F on: 06/27/2022 05:06 PM   Modules accepted: Orders

## 2022-06-28 ENCOUNTER — Encounter
Admission: RE | Admit: 2022-06-28 | Discharge: 2022-06-28 | Disposition: A | Discharge status: Home / Self Care | Payer: Medicare HMO | Source: Ambulatory Visit | Attending: Surgery | Admitting: Surgery

## 2022-06-28 ENCOUNTER — Other Ambulatory Visit: Payer: Self-pay

## 2022-06-28 DIAGNOSIS — Z01812 Encounter for preprocedural laboratory examination: Secondary | ICD-10-CM

## 2022-06-28 DIAGNOSIS — Z01818 Encounter for other preprocedural examination: Secondary | ICD-10-CM | POA: Insufficient documentation

## 2022-06-28 HISTORY — DX: Anemia, unspecified: D64.9

## 2022-06-28 HISTORY — DX: Anxiety disorder, unspecified: F41.9

## 2022-06-28 HISTORY — DX: Prediabetes: R73.03

## 2022-06-28 LAB — CBC
HCT: 24.2 % — ABNORMAL LOW (ref 36.0–46.0)
Hemoglobin: 7.4 g/dL — ABNORMAL LOW (ref 12.0–15.0)
MCH: 23.3 pg — ABNORMAL LOW (ref 26.0–34.0)
MCHC: 30.6 g/dL (ref 30.0–36.0)
MCV: 76.3 fL — ABNORMAL LOW (ref 80.0–100.0)
Platelets: 376 10*3/uL (ref 150–400)
RBC: 3.17 MIL/uL — ABNORMAL LOW (ref 3.87–5.11)
RDW: 15.1 % (ref 11.5–15.5)
WBC: 6.7 10*3/uL (ref 4.0–10.5)
nRBC: 0 % (ref 0.0–0.2)

## 2022-06-28 NOTE — Patient Instructions (Addendum)
Your procedure is scheduled on: 07/05/22 - TUESDAY Report to the Registration Desk on the 1st floor of the [facility]. To find out your arrival time, please call [phone] between 1PM - 3PM on: 07/04/22 - MONDAY If your arrival time is 6:00 am, do not arrive prior to that time as the [facility] entrance doors do not open until 6:00 am.  REMEMBER: Instructions that are not followed completely may result in serious medical risk, up to and including death; or upon the discretion of your surgeon and anesthesiologist your surgery may need to be rescheduled.  FOLLOW BOWEL PREP INSTRUCTIONS PROVIDED TO YOU BY DR. Dahlia Byes.   TAKE THESE MEDICATIONS THE MORNING OF SURGERY WITH A SIP OF WATER:  TAKE PRESCRIBED MEDICATIONS AS ORDERED BY DR.PABON.  HOLD ASPIRIN for 5 days prior to your surgery beginning 06/30/22.  One week prior to surgery: Stop Anti-inflammatories (NSAIDS) such as Advil, Aleve, Ibuprofen, Motrin, Naproxen, Naprosyn and Aspirin based products such as Excedrin, Goodys Powder, BC Powder.  Stop ANY OVER THE COUNTER supplements until after surgery.  You may take Tylenol if needed for pain up until the day of surgery.  No Alcohol for 24 hours before or after surgery.  No Smoking including e-cigarettes for 24 hours prior to surgery.  No chewable tobacco products for at least 6 hours prior to surgery.  No nicotine patches on the day of surgery.  Do not use any "recreational" drugs for at least a week prior to your surgery.  Please be advised that the combination of cocaine and anesthesia may have negative outcomes, up to and including death. If you test positive for cocaine, your surgery will be cancelled.  On the morning of surgery brush your teeth with toothpaste and water, you may rinse your mouth with mouthwash if you wish. Do not swallow any toothpaste or mouthwash.  Use CHG Soap or wipes as directed on instruction sheet.  Do not wear jewelry, make-up, hairpins, clips  or nail polish.  Do not wear lotions, powders, or perfumes.   Do not shave body from the neck down 48 hours prior to surgery just in case you cut yourself which could leave a site for infection. Also, freshly shaved skin may become irritated if using the CHG soap.  Contact lenses, hearing aids and dentures may not be worn into surgery.  Do not bring valuables to the hospital. [facility] is not responsible for any missing/lost belongings or valuables.   Bring your C-PAP to the hospital with you in case you may have to spend the night.   Notify your doctor if there is any change in your medical condition (cold, fever, infection).  Wear comfortable clothing (specific to your surgery type) to the hospital.  After surgery, you can help prevent lung complications by doing breathing exercises.  Take deep breaths and cough every 1-2 hours. Your doctor may order a device called an Incentive Spirometer to help you take deep breaths. When coughing or sneezing, hold a pillow firmly against your incision with both hands. This is called "splinting." Doing this helps protect your incision. It also decreases belly discomfort.  If you are being admitted to the hospital overnight, leave your suitcase in the car. After surgery it may be brought to your room.  If you are being discharged the day of surgery, you will not be allowed to drive home. You will need a responsible adult (18 years or older) to drive you home and stay with you that night.  If you are taking public transportation, you will need to have a responsible adult (18 years or older) with you. Please confirm with your physician that it is acceptable to use public transportation.   Please call the [place] Dept. at [phone] if you have any questions about these instructions.  Surgery Visitation Policy:  Patients undergoing a surgery or procedure may have two family members or support persons with them as long as the  person is not COVID-19 positive or experiencing its symptoms.   Inpatient Visitation:    Visiting hours are 7 a.m. to 8 p.m. Up to four visitors are allowed at one time in a patient room. The visitors may rotate out with other people during the day. One designated support person (adult) may remain overnight.  MASKING: Due to an increase in RSV rates and hospitalizations, in-patient care areas in which we serve newborns, infants and children, masks will be required for teammates and visitors.  Children ages 68 and under may not visit. This policy affects the following departments only:  [place] Postpartum area Mother Baby Unit Newborn nursery/Special care nursery  Other areas: Masks continue to be strongly recommended for [facility] teammates, visitors and patients in all other areas. Visitation is not restricted outside of the units listed above.

## 2022-07-04 ENCOUNTER — Encounter: Payer: Self-pay | Admitting: Surgery

## 2022-07-04 ENCOUNTER — Ambulatory Visit: Payer: Medicare HMO | Admitting: Surgery

## 2022-07-04 VITALS — BP 161/77 | HR 96 | Temp 98.1°F | Ht 66.0 in | Wt 170.8 lb

## 2022-07-04 DIAGNOSIS — K6389 Other specified diseases of intestine: Secondary | ICD-10-CM

## 2022-07-04 MED ORDER — SODIUM CHLORIDE 0.9% IV SOLUTION: Freq: Once | INTRAVENOUS | Status: DC

## 2022-07-04 MED ORDER — ONDANSETRON HCL 4 MG PO TABS: 4.0000 mg | ORAL_TABLET | Freq: Three times a day (TID) | ORAL | 0 refills | Status: DC | PRN

## 2022-07-04 NOTE — H&P (View-Only) (Signed)
Outpatient Surgical Follow Up  07/04/2022  Cindy Vega is an 76 y.o. female.   Chief Complaint  Patient presents with   Follow-up    Colectomy 07/05/22    HPI:  Cindy Vega is a 76 y.o. female following for colon mass.  Please note that I have personally reviewed the endoscopic images and there is an ascending mass encompassing a third of the circumference.  It is not obstructive.  It is not actively bleeding.Path showing at least Intramucosal adenoca She denies any history of colorectal cancer. She Is able to perform more than 4 METS of activity without any shortness of breath or chest pain.  Recently she does endorse being more tired than usual.  She did have colonoscopy 5 years ago and at that time there was a small 3 mm polyp. She completed CT scan for staging showing no evidence of mets, wall thickenning in the cecum  ( I have personally reviewed it) Repeat Hemoglobin is 7.4  rest of the CBC and INR,  CMP is normal.  She denies any melena or hematochezia, no other obvious blood sources.  Past Medical History:  Diagnosis Date   Anemia    Anxiety    Arthritis    fingers   Cancer ([place])    basal cell, COLON   Hypertension    PONV (postoperative nausea and vomiting)    after hysterectomy   Pre-diabetes    Sleep apnea    CPAP    Past Surgical History:  Procedure Laterality Date   ABDOMINAL HYSTERECTOMY  1996   PARTIAL   BREAST CYST EXCISION Right 1976   cyst removed-neg   CATARACT EXTRACTION Bilateral    COLONOSCOPY WITH PROPOFOL N/A 03/02/2017   Procedure: COLONOSCOPY WITH PROPOFOL;  Surgeon: Lucilla Lame, MD;  Location: [place];  Service: Endoscopy;  Laterality: N/A;   COLONOSCOPY WITH PROPOFOL N/A 06/13/2022   Procedure: COLONOSCOPY WITH PROPOFOL WITH BIOPSY;  Surgeon: Lucilla Lame, MD;  Location: [place];  Service: Endoscopy;  Laterality: N/A;  SPOT USED TO MARK ASCENDING COLON POLYP   ESOPHAGOGASTRODUODENOSCOPY (EGD) WITH PROPOFOL  N/A 06/13/2022   Procedure: ESOPHAGOGASTRODUODENOSCOPY (EGD) WITH PROPOFOL;  Surgeon: Lucilla Lame, MD;  Location: [place];  Service: Endoscopy;  Laterality: N/A;  Sleep apnea   POLYPECTOMY N/A 03/02/2017   Procedure: POLYPECTOMY;  Surgeon: Lucilla Lame, MD;  Location: [place];  Service: Endoscopy;  Laterality: N/A;    Family History  Problem Relation Age of Onset   Hypertension Mother    Heart disease Mother    Diabetes Mother    Diabetes Father    Prostate cancer Father    Breast cancer Sister 60   Graves' disease Sister    Breast cancer Sister     Social History:  reports that she has never smoked. She has never used smokeless tobacco. She reports current alcohol use of about 3.0 standard drinks of alcohol per week. She reports that she does not use drugs.  Allergies:  Allergies  Allergen Reactions   Silver Nitrate     LIQUID NITRATE used to remove warts    Medications reviewed.    ROS Full ROS performed and is otherwise negative other than what is stated in HPI   BP (!) 161/77   Pulse 96   Temp 98.1 F (36.7 C) (Oral)   Ht '5\' 6"'$  (1.676 m)   Wt 170 lb 12.8 oz (77.5 kg)   SpO2 99%   BMI 27.57 kg/m  Physical Exam CONSTITUTIONAL: NAD. EYES: Pupils are equal, round, Sclera are non-icteric. EARS, NOSE, MOUTH AND THROAT: T The oral mucosa is pink and moist. Hearing is intact to voice. LYMPH NODES:  Lymph nodes in the neck are normal. RESPIRATORY:  Lungs are clear. There is normal respiratory effort, with equal breath sounds bilaterally, and without pathologic use of accessory muscles. CARDIOVASCULAR: Heart is regular without murmurs, gallops, or rubs. GI: The abdomen is  soft, nontender, and nondistended. There are no palpable masses. There is no hepatosplenomegaly. There are normal bowel sounds in all quadrants. GU: Rectal deferred.   MUSCULOSKELETAL: Normal muscle strength and tone. No cyanosis or edema.   SKIN: Turgor is good and there  are no pathologic skin lesions or ulcers. NEUROLOGIC: Motor and sensation is grossly normal. Cranial nerves are grossly intact. PSYCH:  Oriented to person, place and time. Affect is normal.    Assessment/Plan: 76 year old endoscopically unresectable ascending colon mass c/w adenocarcinoma. .  I do think r she will need a completion right colectomy.    An extensive conversation with the patient and the family regarding her disease process and about the need for colectomy.  I did describe the operative course the risks and the benefits and the possible complications including but not limited IO:[id number], infection, leak, ileus prolonged hospital course .  She understands and she is in agreement.  I do think that she will be a good candidate for laparoscopic hand-assisted colectomy. Bowel prep also d/w the pt in detail as well as the likely need for transfusion given anemia and the nature of the operation. She is In agreement I spent 40 minutes in this encounter including personally reviewing imaging studies, records, coordinating his care, placing orders and performing appropriate documentation.      Caroleen Hamman, MD [facility] General Surgeon

## 2022-07-04 NOTE — Patient Instructions (Addendum)
Please pick up your prescription at your pharmacy.    If you have any concerns or questions, please feel free to call our office.   Minimally Invasive Partial Colectomy, Adult, Care After The following information offers guidance on how to care for yourself after your procedure. Your health care provider may also give you more specific instructions. If you have problems or questions, contact your health care provider. What can I expect after the surgery? After the procedure, it is common to have: Pain, bruising, and swelling. Bloating. Weakness and tiredness (fatigue). Changes to your bowel movements, especially having bowel movements more often. Follow these instructions at home: Medicines Take over-the-counter and prescription medicines only as told by your health care provider. If you were prescribed an antibiotic medicine, take it as told by your health care provider. Do not stop using the antibiotic even if you start to feel better. Ask your health care provider if the medicine prescribed to you: Requires you to avoid driving or using machinery. Can cause constipation. You may need to take these actions to prevent or treat constipation: Drink enough fluids to keep your urine pale yellow. Take over-the-counter or prescription medicines. Limit foods that are high in fat and processed sugars, such as fried or sweet foods. Eating and drinking Follow instructions from your health care provider about what you may eat and drink. Do not drink alcohol if your health care provider tells you not to drink. Eat a low-fiber diet for the first 4 weeks after surgery or as told by your health care provider.. Most people on a low-fiber eating plan should eat less than 10 grams (g) of fiber a day. Follow recommendations from your health care provider or dietitian about how much fiber you should have each day. Always check food labels to know the fiber content of packaged foods. In general, a low-fiber  food will have fewer than 2 g of fiber per serving. In general, try to avoid whole grains, raw fruits and vegetables, dried fruit, tough cuts of meat, nuts, and seeds. Incision care  Follow instructions from your health care provider about how to take care of your incisions. Make sure you: Wash your hands with soap and water for at least 20 seconds before and after you change your bandage (dressing). If soap and water are not available, use hand sanitizer. Change your dressing as told by your health care provider. Leave stitches (sutures), skin glue, or adhesive strips in place. These skin closures may need to stay in place for 2 weeks or longer. If adhesive strip edges start to loosen and curl up, you may trim the loose edges. Do not remove adhesive strips completely unless your health care provider tells you to do that. Check your incision area every day for signs of infection. Check for: More redness, swelling, or pain. Fluid or blood. Warmth. Pus or a bad smell. Activity Rest as told by your health care provider. Avoid sitting for a long time without moving. Get up to take short walks every 1-2 hours. This is important to improve blood flow and breathing. Ask for help if you feel weak or unsteady. You may have to avoid lifting. Ask your health care provider how much you can safely lift. Return to your normal activities as told by your health care provider. Ask your health care provider what activities are safe for you. General instructions Do not use any products that contain nicotine or tobacco. These products include cigarettes, chewing tobacco, and vaping devices, such  as e-cigarettes. If you need help quitting, ask your health care provider. Do not take baths, swim, or use a hot tub until your health care provider approves. Ask your health care provider if you may take showers. You may only be allowed to take sponge baths. Wear compression stockings as told by your health care provider.  These stockings help to prevent blood clots and reduce swelling in your legs. Keep all follow-up visits. This is important to monitor healing and check for any complications. Contact a health care provider if: Medicine is not controlling your pain. You have chills or fever. You have any signs of infection in your incision areas. You have a persistent cough. You have nausea or vomiting. You develop a rash. You have not had a bowel movement in 3 days. Get help right away if: You have severe pain. Your incisions break open after sutures or staples have been removed. You are bleeding from your rectum or have blood in your stool. You have a warm, tender swelling in your leg. You have chest pain or trouble breathing. You have increased swelling in the abdomen. You feel light-headed or you faint. These symptoms may be an emergency. Get help right away. Call 911. Do not wait to see if the symptoms will go away. Do not drive yourself to the hospital. Summary After surgery, it is common to have some pain, bruising, swelling, bloating, tiredness, weakness, or changes to your bowel movements. Follow instructions from your health care provider about what to eat and drink. Return to your normal activities as told by your health care provider. Check your incision area every day for signs of infection. Get help right away if you have chest pain or trouble breathing. This information is not intended to replace advice given to you by your health care provider. Make sure you discuss any questions you have with your health care provider. Document Revised: 10/27/2021 Document Reviewed: 10/27/2021 Elsevier Patient Education  [place].

## 2022-07-04 NOTE — Progress Notes (Signed)
Outpatient Surgical Follow Up  07/04/2022  Cindy Vega is an 76 y.o. female.   Chief Complaint  Patient presents with   Follow-up    Colectomy 07/05/22    HPI:  DYLANIE Vega is a 76 y.o. female following for colon mass.  Please note that I have personally reviewed the endoscopic images and there is an ascending mass encompassing a third of the circumference.  It is not obstructive.  It is not actively bleeding.Path showing at least Intramucosal adenoca She denies any history of colorectal cancer. She Is able to perform more than 4 METS of activity without any shortness of breath or chest pain.  Recently she does endorse being more tired than usual.  She did have colonoscopy 5 years ago and at that time there was a small 3 mm polyp. She completed CT scan for staging showing no evidence of mets, wall thickenning in the cecum  ( I have personally reviewed it) Repeat Hemoglobin is 7.4  rest of the CBC and INR,  CMP is normal.  She denies any melena or hematochezia, no other obvious blood sources.  Past Medical History:  Diagnosis Date   Anemia    Anxiety    Arthritis    fingers   Cancer ([place])    basal cell, COLON   Hypertension    PONV (postoperative nausea and vomiting)    after hysterectomy   Pre-diabetes    Sleep apnea    CPAP    Past Surgical History:  Procedure Laterality Date   ABDOMINAL HYSTERECTOMY  1996   PARTIAL   BREAST CYST EXCISION Right 1976   cyst removed-neg   CATARACT EXTRACTION Bilateral    COLONOSCOPY WITH PROPOFOL N/A 03/02/2017   Procedure: COLONOSCOPY WITH PROPOFOL;  Surgeon: Lucilla Lame, MD;  Location: [facility];  Service: Endoscopy;  Laterality: N/A;   COLONOSCOPY WITH PROPOFOL N/A 06/13/2022   Procedure: COLONOSCOPY WITH PROPOFOL WITH BIOPSY;  Surgeon: Lucilla Lame, MD;  Location: [facility];  Service: Endoscopy;  Laterality: N/A;  SPOT USED TO MARK ASCENDING COLON POLYP   ESOPHAGOGASTRODUODENOSCOPY (EGD) WITH PROPOFOL  N/A 06/13/2022   Procedure: ESOPHAGOGASTRODUODENOSCOPY (EGD) WITH PROPOFOL;  Surgeon: Lucilla Lame, MD;  Location: [facility];  Service: Endoscopy;  Laterality: N/A;  Sleep apnea   POLYPECTOMY N/A 03/02/2017   Procedure: POLYPECTOMY;  Surgeon: Lucilla Lame, MD;  Location: [facility];  Service: Endoscopy;  Laterality: N/A;    Family History  Problem Relation Age of Onset   Hypertension Mother    Heart disease Mother    Diabetes Mother    Diabetes Father    Prostate cancer Father    Breast cancer Sister 94   Graves' disease Sister    Breast cancer Sister     Social History:  reports that she has never smoked. She has never used smokeless tobacco. She reports current alcohol use of about 3.0 standard drinks of alcohol per week. She reports that she does not use drugs.  Allergies:  Allergies  Allergen Reactions   Silver Nitrate     LIQUID NITRATE used to remove warts    Medications reviewed.    ROS Full ROS performed and is otherwise negative other than what is stated in HPI   BP (!) 161/77   Pulse 96   Temp 98.1 F (36.7 C) (Oral)   Ht '5\' 6"'$  (1.676 m)   Wt 170 lb 12.8 oz (77.5 kg)   SpO2 99%   BMI 27.57 kg/m  Physical Exam CONSTITUTIONAL: NAD. EYES: Pupils are equal, round, Sclera are non-icteric. EARS, NOSE, MOUTH AND THROAT: T The oral mucosa is pink and moist. Hearing is intact to voice. LYMPH NODES:  Lymph nodes in the neck are normal. RESPIRATORY:  Lungs are clear. There is normal respiratory effort, with equal breath sounds bilaterally, and without pathologic use of accessory muscles. CARDIOVASCULAR: Heart is regular without murmurs, gallops, or rubs. GI: The abdomen is  soft, nontender, and nondistended. There are no palpable masses. There is no hepatosplenomegaly. There are normal bowel sounds in all quadrants. GU: Rectal deferred.   MUSCULOSKELETAL: Normal muscle strength and tone. No cyanosis or edema.   SKIN: Turgor is good and there  are no pathologic skin lesions or ulcers. NEUROLOGIC: Motor and sensation is grossly normal. Cranial nerves are grossly intact. PSYCH:  Oriented to person, place and time. Affect is normal.    Assessment/Plan: 76 year old endoscopically unresectable ascending colon mass c/w adenocarcinoma. .  I do think r she will need a completion right colectomy.    An extensive conversation with the patient and the family regarding her disease process and about the need for colectomy.  I did describe the operative course the risks and the benefits and the possible complications including but not limited NK:[id number], infection, leak, ileus prolonged hospital course .  She understands and she is in agreement.  I do think that she will be a good candidate for laparoscopic hand-assisted colectomy. Bowel prep also d/w the pt in detail as well as the likely need for transfusion given anemia and the nature of the operation. She is In agreement I spent 40 minutes in this encounter including personally reviewing imaging studies, records, coordinating his care, placing orders and performing appropriate documentation.      Caroleen Hamman, MD [facility] General Surgeon

## 2022-07-05 ENCOUNTER — Inpatient Hospital Stay
Admission: RE | Admit: 2022-07-05 | Discharge: 2022-07-09 | DRG: 330 | Disposition: A | Discharge status: Home / Self Care | Payer: Medicare HMO | Attending: Surgery | Admitting: Surgery

## 2022-07-05 ENCOUNTER — Other Ambulatory Visit: Payer: Self-pay

## 2022-07-05 ENCOUNTER — Inpatient Hospital Stay: Payer: Medicare HMO | Admitting: Certified Registered"

## 2022-07-05 ENCOUNTER — Encounter: Payer: Self-pay | Admitting: Surgery

## 2022-07-05 ENCOUNTER — Encounter
Admission: RE | Disposition: A | Discharge status: Home / Self Care | Payer: Self-pay | Source: Home / Self Care | Attending: Surgery

## 2022-07-05 DIAGNOSIS — C182 Malignant neoplasm of ascending colon: Secondary | ICD-10-CM | POA: Diagnosis not present

## 2022-07-05 DIAGNOSIS — Z888 Allergy status to other drugs, medicaments and biological substances status: Secondary | ICD-10-CM | POA: Diagnosis not present

## 2022-07-05 DIAGNOSIS — N179 Acute kidney failure, unspecified: Secondary | ICD-10-CM | POA: Diagnosis present

## 2022-07-05 DIAGNOSIS — Z803 Family history of malignant neoplasm of breast: Secondary | ICD-10-CM

## 2022-07-05 DIAGNOSIS — Z8042 Family history of malignant neoplasm of prostate: Secondary | ICD-10-CM | POA: Diagnosis not present

## 2022-07-05 DIAGNOSIS — Z90711 Acquired absence of uterus with remaining cervical stump: Secondary | ICD-10-CM | POA: Diagnosis not present

## 2022-07-05 DIAGNOSIS — G4730 Sleep apnea, unspecified: Secondary | ICD-10-CM | POA: Diagnosis not present

## 2022-07-05 DIAGNOSIS — D630 Anemia in neoplastic disease: Secondary | ICD-10-CM | POA: Diagnosis not present

## 2022-07-05 DIAGNOSIS — I10 Essential (primary) hypertension: Secondary | ICD-10-CM | POA: Diagnosis not present

## 2022-07-05 DIAGNOSIS — Z8249 Family history of ischemic heart disease and other diseases of the circulatory system: Secondary | ICD-10-CM

## 2022-07-05 DIAGNOSIS — K6389 Other specified diseases of intestine: Secondary | ICD-10-CM | POA: Diagnosis not present

## 2022-07-05 DIAGNOSIS — F419 Anxiety disorder, unspecified: Secondary | ICD-10-CM | POA: Diagnosis present

## 2022-07-05 DIAGNOSIS — Z833 Family history of diabetes mellitus: Secondary | ICD-10-CM | POA: Diagnosis not present

## 2022-07-05 HISTORY — PX: LAPAROSCOPIC RIGHT COLECTOMY: SHX5925

## 2022-07-05 LAB — CBC
MCHC: 31.7 g/dL (ref 30.0–36.0)
MCV: 75.9 fL — ABNORMAL LOW (ref 80.0–100.0)
Platelets: 384 10*3/uL (ref 150–400)
nRBC: 0 % (ref 0.0–0.2)

## 2022-07-05 LAB — PREPARE RBC (CROSSMATCH)

## 2022-07-05 LAB — ABO/RH: ABO/RH(D): A POS

## 2022-07-05 SURGERY — COLECTOMY, RIGHT, LAPAROSCOPIC
Anesthesia: General

## 2022-07-05 MED ORDER — OXYCODONE HCL 5 MG PO TABS
ORAL_TABLET | ORAL | Status: AC
  Filled 2022-07-05: qty 1

## 2022-07-05 MED ORDER — OXYCODONE HCL 5 MG PO TABS
5.0000 mg | ORAL_TABLET | ORAL | Status: DC | PRN
  Administered 2022-07-05 – 2022-07-07 (×5): 5 mg via ORAL

## 2022-07-05 MED ORDER — SODIUM CHLORIDE 0.9 % IV SOLN: 10.0000 mL/h | Freq: Once | INTRAVENOUS | Status: AC

## 2022-07-05 MED ORDER — ONDANSETRON 4 MG PO TBDP
4.0000 mg | ORAL_TABLET | Freq: Four times a day (QID) | ORAL | Status: DC | PRN
  Administered 2022-07-07 (×2): 4 mg via ORAL

## 2022-07-05 MED ORDER — FENTANYL CITRATE (PF) 100 MCG/2ML IJ SOLN: 25.0000 ug | INTRAMUSCULAR | Status: DC | PRN

## 2022-07-05 MED ORDER — DEXAMETHASONE SODIUM PHOSPHATE 10 MG/ML IJ SOLN
INTRAMUSCULAR | Status: AC
  Filled 2022-07-05: qty 1

## 2022-07-05 MED ORDER — ONDANSETRON HCL 4 MG/2ML IJ SOLN
INTRAMUSCULAR | Status: AC
  Filled 2022-07-05: qty 2

## 2022-07-05 MED ORDER — SODIUM CHLORIDE 0.9 % IV SOLN: INTRAVENOUS | Status: DC

## 2022-07-05 MED ORDER — OXYCODONE HCL 5 MG/5ML PO SOLN: 5.0000 mg | Freq: Once | ORAL | Status: DC | PRN

## 2022-07-05 MED ORDER — CHLORHEXIDINE GLUCONATE CLOTH 2 % EX PADS: 6.0000 | MEDICATED_PAD | Freq: Once | CUTANEOUS | Status: DC

## 2022-07-05 MED ORDER — FENTANYL CITRATE (PF) 100 MCG/2ML IJ SOLN
INTRAMUSCULAR | Status: AC
  Filled 2022-07-05: qty 2

## 2022-07-05 MED ORDER — CHLORHEXIDINE GLUCONATE 0.12 % MT SOLN: 15.0000 mL | Freq: Once | OROMUCOSAL | Status: AC

## 2022-07-05 MED ORDER — ACETAMINOPHEN 500 MG PO TABS
ORAL_TABLET | ORAL | Status: AC
  Filled 2022-07-05: qty 2

## 2022-07-05 MED ORDER — ALVIMOPAN 12 MG PO CAPS
ORAL_CAPSULE | ORAL | Status: AC
  Administered 2022-07-05: 12 mg via ORAL
  Filled 2022-07-05: qty 1

## 2022-07-05 MED ORDER — GABAPENTIN 300 MG PO CAPS
ORAL_CAPSULE | ORAL | Status: AC
  Administered 2022-07-05: 300 mg via ORAL
  Filled 2022-07-05: qty 1

## 2022-07-05 MED ORDER — ROCURONIUM BROMIDE 100 MG/10ML IV SOLN
INTRAVENOUS | Status: DC | PRN
  Administered 2022-07-05 (×2): 10 mg via INTRAVENOUS
  Administered 2022-07-05: 50 mg via INTRAVENOUS
  Administered 2022-07-05: 10 mg via INTRAVENOUS

## 2022-07-05 MED ORDER — ALVIMOPAN 12 MG PO CAPS: 12.0000 mg | ORAL_CAPSULE | ORAL | Status: AC

## 2022-07-05 MED ORDER — MIDAZOLAM HCL 2 MG/2ML IJ SOLN
INTRAMUSCULAR | Status: AC
  Filled 2022-07-05: qty 2

## 2022-07-05 MED ORDER — ACETAMINOPHEN 500 MG PO TABS
ORAL_TABLET | ORAL | Status: AC
  Administered 2022-07-05: 1000 mg via ORAL
  Filled 2022-07-05: qty 2

## 2022-07-05 MED ORDER — LIDOCAINE HCL (CARDIAC) PF 100 MG/5ML IV SOSY
PREFILLED_SYRINGE | INTRAVENOUS | Status: DC | PRN
  Administered 2022-07-05: 100 mg via INTRAVENOUS

## 2022-07-05 MED ORDER — SODIUM CHLORIDE 0.9 % IV SOLN
INTRAVENOUS | Status: DC | PRN
  Administered 2022-07-05: 70 mL

## 2022-07-05 MED ORDER — CHLORHEXIDINE GLUCONATE CLOTH 2 % EX PADS
6.0000 | MEDICATED_PAD | Freq: Once | CUTANEOUS | Status: AC
  Administered 2022-07-05: 6 via TOPICAL

## 2022-07-05 MED ORDER — LACTATED RINGERS IV SOLN: INTRAVENOUS | Status: DC

## 2022-07-05 MED ORDER — PHENYLEPHRINE 80 MCG/ML (10ML) SYRINGE FOR IV PUSH (FOR BLOOD PRESSURE SUPPORT)
PREFILLED_SYRINGE | INTRAVENOUS | Status: AC
  Filled 2022-07-05: qty 10

## 2022-07-05 MED ORDER — SODIUM CHLORIDE (PF) 0.9 % IJ SOLN
INTRAMUSCULAR | Status: AC
  Filled 2022-07-05: qty 50

## 2022-07-05 MED ORDER — LACTATED RINGERS IV SOLN: INTRAVENOUS | Status: DC | PRN

## 2022-07-05 MED ORDER — KETOROLAC TROMETHAMINE 15 MG/ML IJ SOLN
INTRAMUSCULAR | Status: AC
  Filled 2022-07-05: qty 1

## 2022-07-05 MED ORDER — PROPOFOL 10 MG/ML IV BOLUS
INTRAVENOUS | Status: DC | PRN
  Administered 2022-07-05: 50 mg via INTRAVENOUS
  Administered 2022-07-05: 140 mg via INTRAVENOUS

## 2022-07-05 MED ORDER — GABAPENTIN 300 MG PO CAPS: 300.0000 mg | ORAL_CAPSULE | ORAL | Status: AC

## 2022-07-05 MED ORDER — PHENYLEPHRINE HCL-NACL 20-0.9 MG/250ML-% IV SOLN
INTRAVENOUS | Status: AC
  Filled 2022-07-05: qty 250

## 2022-07-05 MED ORDER — DIPHENHYDRAMINE HCL 12.5 MG/5ML PO ELIX: 12.5000 mg | ORAL_SOLUTION | Freq: Four times a day (QID) | ORAL | Status: DC | PRN

## 2022-07-05 MED ORDER — BUPIVACAINE LIPOSOME 1.3 % IJ SUSP
INTRAMUSCULAR | Status: AC
  Filled 2022-07-05: qty 20

## 2022-07-05 MED ORDER — ENOXAPARIN SODIUM 40 MG/0.4ML IJ SOSY
40.0000 mg | PREFILLED_SYRINGE | INTRAMUSCULAR | Status: DC
  Administered 2022-07-06: 40 mg via SUBCUTANEOUS

## 2022-07-05 MED ORDER — HYDROMORPHONE HCL 1 MG/ML IJ SOLN
INTRAMUSCULAR | Status: DC | PRN
  Administered 2022-07-05 (×2): .5 mg via INTRAVENOUS

## 2022-07-05 MED ORDER — APREPITANT 40 MG PO CAPS
ORAL_CAPSULE | ORAL | Status: AC
  Filled 2022-07-05: qty 1

## 2022-07-05 MED ORDER — SUGAMMADEX SODIUM 200 MG/2ML IV SOLN
INTRAVENOUS | Status: DC | PRN
  Administered 2022-07-05: 200 mg via INTRAVENOUS

## 2022-07-05 MED ORDER — SODIUM CHLORIDE 0.9 % IV SOLN
2.0000 g | INTRAVENOUS | Status: AC
  Administered 2022-07-05: 2 g via INTRAVENOUS

## 2022-07-05 MED ORDER — CHLORHEXIDINE GLUCONATE 0.12 % MT SOLN
OROMUCOSAL | Status: AC
  Administered 2022-07-05: 15 mL via OROMUCOSAL
  Filled 2022-07-05: qty 15

## 2022-07-05 MED ORDER — BUPIVACAINE-EPINEPHRINE (PF) 0.5% -1:200000 IJ SOLN
INTRAMUSCULAR | Status: AC
  Filled 2022-07-05: qty 30

## 2022-07-05 MED ORDER — SODIUM CHLORIDE 0.9 % IV SOLN
INTRAVENOUS | Status: AC
  Filled 2022-07-05: qty 2

## 2022-07-05 MED ORDER — FAMOTIDINE 20 MG PO TABS
ORAL_TABLET | ORAL | Status: AC
  Administered 2022-07-05: 20 mg via ORAL
  Filled 2022-07-05: qty 1

## 2022-07-05 MED ORDER — ALVIMOPAN 12 MG PO CAPS: 12.0000 mg | ORAL_CAPSULE | ORAL | Status: DC

## 2022-07-05 MED ORDER — LIDOCAINE HCL (PF) 2 % IJ SOLN
INTRAMUSCULAR | Status: AC
  Filled 2022-07-05: qty 5

## 2022-07-05 MED ORDER — FAMOTIDINE 20 MG PO TABS: 20.0000 mg | ORAL_TABLET | Freq: Once | ORAL | Status: AC

## 2022-07-05 MED ORDER — DIPHENHYDRAMINE HCL 50 MG/ML IJ SOLN: 12.5000 mg | Freq: Four times a day (QID) | INTRAMUSCULAR | Status: DC | PRN

## 2022-07-05 MED ORDER — PHENYLEPHRINE HCL-NACL 20-0.9 MG/250ML-% IV SOLN
INTRAVENOUS | Status: DC | PRN
  Administered 2022-07-05: 25 ug/min via INTRAVENOUS

## 2022-07-05 MED ORDER — SODIUM CHLORIDE 0.9 % IV SOLN
2.0000 g | Freq: Two times a day (BID) | INTRAVENOUS | Status: AC
  Administered 2022-07-06 (×2): 2 g via INTRAVENOUS

## 2022-07-05 MED ORDER — ORAL CARE MOUTH RINSE: 15.0000 mL | Freq: Once | OROMUCOSAL | Status: AC

## 2022-07-05 MED ORDER — CELECOXIB 200 MG PO CAPS
ORAL_CAPSULE | ORAL | Status: AC
  Administered 2022-07-05: 200 mg via ORAL
  Filled 2022-07-05: qty 1

## 2022-07-05 MED ORDER — GLYCOPYRROLATE 0.2 MG/ML IJ SOLN
INTRAMUSCULAR | Status: AC
  Filled 2022-07-05: qty 1

## 2022-07-05 MED ORDER — ROCURONIUM BROMIDE 10 MG/ML (PF) SYRINGE
PREFILLED_SYRINGE | INTRAVENOUS | Status: AC
  Filled 2022-07-05: qty 10

## 2022-07-05 MED ORDER — ACETAMINOPHEN 500 MG PO TABS
1000.0000 mg | ORAL_TABLET | Freq: Four times a day (QID) | ORAL | Status: DC
  Administered 2022-07-05 – 2022-07-09 (×13): 1000 mg via ORAL
  Filled 2022-07-05 (×4): qty 2

## 2022-07-05 MED ORDER — KETOROLAC TROMETHAMINE 15 MG/ML IJ SOLN
15.0000 mg | Freq: Four times a day (QID) | INTRAMUSCULAR | Status: DC
  Administered 2022-07-05 – 2022-07-06 (×5): 15 mg via INTRAVENOUS

## 2022-07-05 MED ORDER — CELECOXIB 200 MG PO CAPS: 200.0000 mg | ORAL_CAPSULE | ORAL | Status: AC

## 2022-07-05 MED ORDER — PROCHLORPERAZINE MALEATE 10 MG PO TABS
10.0000 mg | ORAL_TABLET | Freq: Four times a day (QID) | ORAL | Status: DC | PRN
  Administered 2022-07-07: 10 mg via ORAL

## 2022-07-05 MED ORDER — DEXAMETHASONE SODIUM PHOSPHATE 10 MG/ML IJ SOLN
INTRAMUSCULAR | Status: DC | PRN
  Administered 2022-07-05: 10 mg via INTRAVENOUS

## 2022-07-05 MED ORDER — HYDROMORPHONE HCL 1 MG/ML IJ SOLN
INTRAMUSCULAR | Status: AC
  Filled 2022-07-05: qty 1

## 2022-07-05 MED ORDER — OXYCODONE HCL 5 MG PO TABS: 5.0000 mg | ORAL_TABLET | Freq: Once | ORAL | Status: DC | PRN

## 2022-07-05 MED ORDER — PANTOPRAZOLE SODIUM 40 MG IV SOLR
40.0000 mg | Freq: Every day | INTRAVENOUS | Status: DC
  Administered 2022-07-05: 40 mg via INTRAVENOUS

## 2022-07-05 MED ORDER — ACETAMINOPHEN 500 MG PO TABS: 1000.0000 mg | ORAL_TABLET | ORAL | Status: AC

## 2022-07-05 MED ORDER — MIDAZOLAM HCL 2 MG/2ML IJ SOLN
INTRAMUSCULAR | Status: DC | PRN
  Administered 2022-07-05 (×2): 1 mg via INTRAVENOUS

## 2022-07-05 MED ORDER — NALOXONE HCL 2 MG/2ML IJ SOSY
PREFILLED_SYRINGE | INTRAMUSCULAR | Status: AC
  Filled 2022-07-05: qty 2

## 2022-07-05 MED ORDER — MELATONIN 5 MG PO TABS: 2.5000 mg | ORAL_TABLET | Freq: Every evening | ORAL | Status: DC | PRN

## 2022-07-05 MED ORDER — MORPHINE SULFATE (PF) 2 MG/ML IV SOLN: 2.0000 mg | INTRAVENOUS | Status: DC | PRN

## 2022-07-05 MED ORDER — BUPIVACAINE-EPINEPHRINE (PF) 0.5% -1:200000 IJ SOLN
INTRAMUSCULAR | Status: DC | PRN
  Administered 2022-07-05: 30 mL

## 2022-07-05 MED ORDER — PROPOFOL 10 MG/ML IV BOLUS
INTRAVENOUS | Status: AC
  Filled 2022-07-05: qty 20

## 2022-07-05 MED ORDER — FENTANYL CITRATE (PF) 100 MCG/2ML IJ SOLN
INTRAMUSCULAR | Status: DC | PRN
  Administered 2022-07-05 (×2): 25 ug via INTRAVENOUS
  Administered 2022-07-05: 50 ug via INTRAVENOUS

## 2022-07-05 MED ORDER — PANTOPRAZOLE SODIUM 40 MG IV SOLR
INTRAVENOUS | Status: AC
  Filled 2022-07-05: qty 10

## 2022-07-05 MED ORDER — LATANOPROST 0.005 % OP SOLN
1.0000 [drp] | Freq: Every day | OPHTHALMIC | Status: DC
  Administered 2022-07-08: 1 [drp] via OPHTHALMIC
  Filled 2022-07-05: qty 2.5

## 2022-07-05 MED ORDER — EPHEDRINE 5 MG/ML INJ
INTRAVENOUS | Status: AC
  Filled 2022-07-05: qty 5

## 2022-07-05 MED ORDER — ONDANSETRON HCL 4 MG/2ML IJ SOLN: 4.0000 mg | Freq: Four times a day (QID) | INTRAMUSCULAR | Status: DC | PRN

## 2022-07-05 MED ORDER — PROCHLORPERAZINE EDISYLATE 10 MG/2ML IJ SOLN: 5.0000 mg | Freq: Four times a day (QID) | INTRAMUSCULAR | Status: DC | PRN

## 2022-07-05 MED ORDER — APREPITANT 40 MG PO CAPS
40.0000 mg | ORAL_CAPSULE | Freq: Once | ORAL | Status: AC
  Administered 2022-07-05: 40 mg via ORAL

## 2022-07-05 SURGICAL SUPPLY — 54 items
BLADE SURG SZ10 CARB STEEL (BLADE) ×1 IMPLANT
DERMABOND ADVANCED .7 DNX12 (GAUZE/BANDAGES/DRESSINGS) ×2 IMPLANT
DRAPE INCISE IOBAN 66X45 STRL (DRAPES) ×1 IMPLANT
DRSG OPSITE POSTOP 3X4 (GAUZE/BANDAGES/DRESSINGS) IMPLANT
DRSG OPSITE POSTOP 4X8 (GAUZE/BANDAGES/DRESSINGS) IMPLANT
ELECT BLADE 6.5 EXT (BLADE) IMPLANT
ELECT CAUTERY BLADE 6.4 (BLADE) ×2 IMPLANT
ELECT REM PT RETURN 9FT ADLT (ELECTROSURGICAL) ×1
ELECTRODE REM PT RTRN 9FT ADLT (ELECTROSURGICAL) ×1 IMPLANT
GLOVE BIO SURGEON STRL SZ7 (GLOVE) ×3 IMPLANT
GOWN STRL REUS W/ TWL LRG LVL3 (GOWN DISPOSABLE) ×4 IMPLANT
GOWN STRL REUS W/TWL LRG LVL3 (GOWN DISPOSABLE) ×5
HANDLE SUCTION POOLE (INSTRUMENTS) ×1 IMPLANT
HANDLE YANKAUER SUCT BULB TIP (MISCELLANEOUS) ×1 IMPLANT
HOLDER FOLEY CATH W/STRAP (MISCELLANEOUS) ×1 IMPLANT
IRRIGATION STRYKERFLOW (MISCELLANEOUS) IMPLANT
IRRIGATOR STRYKERFLOW (MISCELLANEOUS) ×1
MANIFOLD NEPTUNE II (INSTRUMENTS) ×1 IMPLANT
NEEDLE HYPO 22GX1.5 SAFETY (NEEDLE) ×1 IMPLANT
NS IRRIG 1000ML POUR BTL (IV SOLUTION) ×1 IMPLANT
PACK COLON CLEAN CLOSURE (MISCELLANEOUS) ×1 IMPLANT
PACK LAP CHOLECYSTECTOMY (MISCELLANEOUS) ×1 IMPLANT
PENCIL SMOKE EVACUATOR (MISCELLANEOUS) ×1 IMPLANT
RELOAD PROXIMATE 75MM BLUE (ENDOMECHANICALS) ×2 IMPLANT
RELOAD STAPLE 75 3.8 BLU REG (ENDOMECHANICALS) IMPLANT
RETRACTOR WOUND ALXS 18CM MED (MISCELLANEOUS) IMPLANT
RTRCTR WOUND ALEXIS O 18CM MED (MISCELLANEOUS) ×1
SHEARS HARMONIC ACE PLUS 36CM (ENDOMECHANICALS) ×1 IMPLANT
SLEEVE Z-THREAD 5X100MM (TROCAR) ×1 IMPLANT
SPIKE FLUID TRANSFER (MISCELLANEOUS) ×1 IMPLANT
SPONGE T-LAP 18X18 ~~LOC~~+RFID (SPONGE) ×3 IMPLANT
SPONGE T-LAP 18X36 ~~LOC~~+RFID STR (SPONGE) ×1 IMPLANT
STAPLER PROXIMATE 75MM BLUE (STAPLE) IMPLANT
STAPLER SKIN PROX 35W (STAPLE) IMPLANT
SUCTION POOLE HANDLE (INSTRUMENTS)
SUT MNCRL 4-0 (SUTURE) ×2
SUT MNCRL 4-0 27XMFL (SUTURE) ×2
SUT PDS AB 0 CT1 27 (SUTURE) ×2 IMPLANT
SUT SILK 2 0 (SUTURE) ×1
SUT SILK 2 0 SH CR/8 (SUTURE) ×1 IMPLANT
SUT SILK 2-0 30XBRD TIE 12 (SUTURE) ×1 IMPLANT
SUT VIC AB 2-0 SH 27 (SUTURE) ×3
SUT VIC AB 2-0 SH 27XBRD (SUTURE) IMPLANT
SUT VICRYL 0 UR6 27IN ABS (SUTURE) ×2 IMPLANT
SUTURE MNCRL 4-0 27XMF (SUTURE) ×2 IMPLANT
SYR 20ML LL LF (SYRINGE) ×1 IMPLANT
SYS LAPSCP GELPORT 120MM (MISCELLANEOUS) ×1
SYSTEM LAPSCP GELPORT 120MM (MISCELLANEOUS) ×1 IMPLANT
TOWEL OR 17X26 4PK STRL BLUE (TOWEL DISPOSABLE) ×1 IMPLANT
TRAP FLUID SMOKE EVACUATOR (MISCELLANEOUS) ×1 IMPLANT
TRAY FOLEY MTR SLVR 16FR STAT (SET/KITS/TRAYS/PACK) ×1 IMPLANT
TROCAR XCEL NON-BLD 5MMX100MML (ENDOMECHANICALS) ×1 IMPLANT
TUBING EVAC SMOKE HEATED PNEUM (TUBING) ×1 IMPLANT
WATER STERILE IRR 500ML POUR (IV SOLUTION) ×1 IMPLANT

## 2022-07-05 NOTE — Interval H&P Note (Signed)
History and Physical Interval Note:  07/05/2022 11:10 AM  Cindy Vega  has presented today for surgery, with the diagnosis of Colon mass.  The various methods of treatment have been discussed with the patient and family. After consideration of risks, benefits and other options for treatment, the patient has consented to  Procedure(s): LAPAROSCOPIC RIGHT COLECTOMY hand assisted, RNFA to assist (N/A) as a surgical intervention.  The patient's history has been reviewed, patient examined, no change in status, stable for surgery.  I have reviewed the patient's chart and labs.  Questions were answered to the patient's satisfaction.     [place]

## 2022-07-05 NOTE — Transfer of Care (Signed)
Immediate Anesthesia Transfer of Care Note  Patient: Cindy Vega  Procedure(s) Performed: LAPAROSCOPIC RIGHT COLECTOMY hand assisted, RNFA to assist  Patient Location: PACU  Anesthesia Type:General  Level of Consciousness: drowsy  Airway & Oxygen Therapy: Patient Spontanous Breathing and Patient connected to face mask oxygen  Post-op Assessment: Report given to RN and Post -op Vital signs reviewed and stable  Post vital signs: Reviewed and stable  Last Vitals:  Vitals Value Taken Time  BP 154/67 07/05/22 1434  Temp    Pulse 81 07/05/22 1440  Resp 15 07/05/22 1440  SpO2 100 % 07/05/22 1440  Vitals shown include unvalidated device data.  Last Pain:  Vitals:   07/05/22 0822  TempSrc: Temporal  PainSc: 0-No pain         Complications: No notable events documented.

## 2022-07-05 NOTE — Anesthesia Procedure Notes (Signed)
Procedure Name: Intubation Date/Time: 07/05/2022 11:59 AM  Performed by: Adalberto Ill, CRNAPre-anesthesia Checklist: Patient identified, Emergency Drugs available, Suction available, Patient being monitored and Timeout performed Patient Re-evaluated:Patient Re-evaluated prior to induction Oxygen Delivery Method: Circle system utilized Preoxygenation: Pre-oxygenation with 100% oxygen Induction Type: IV induction Ventilation: Mask ventilation without difficulty Laryngoscope Size: 2 and Miller Grade View: Grade I Tube type: Oral Tube size: 7.0 mm Airway Equipment and Method: Stylet Placement Confirmation: ETT inserted through vocal cords under direct vision, positive ETCO2 and breath sounds checked- equal and bilateral Secured at: 22 cm Tube secured with: Tape Dental Injury: Teeth and Oropharynx as per pre-operative assessment  Comments: Brief atraumatic dentition unchanged

## 2022-07-05 NOTE — Anesthesia Preprocedure Evaluation (Signed)
Anesthesia Evaluation  Patient identified by MRN, date of birth, ID band Patient awake    Reviewed: Allergy & Precautions, NPO status , Patient's Chart, lab work & pertinent test results  History of Anesthesia Complications (+) PONV and history of anesthetic complications  Airway Mallampati: III  TM Distance: <3 FB Neck ROM: full    Dental  (+) Chipped   Pulmonary neg shortness of breath, sleep apnea    Pulmonary exam normal        Cardiovascular Exercise Tolerance: Good hypertension, (-) angina Normal cardiovascular exam     Neuro/Psych   Anxiety     negative neurological ROS  negative psych ROS   GI/Hepatic negative GI ROS, Neg liver ROS,neg GERD  ,,  Endo/Other  negative endocrine ROS    Renal/GU      Musculoskeletal   Abdominal   Peds  Hematology negative hematology ROS (+)   Anesthesia Other Findings Past Medical History: No date: Anemia No date: Anxiety No date: Arthritis     Comment:  fingers No date: Cancer ([place])     Comment:  basal cell, COLON No date: Hypertension No date: PONV (postoperative nausea and vomiting)     Comment:  after hysterectomy No date: Pre-diabetes No date: Sleep apnea     Comment:  CPAP  Past Surgical History: 1996: ABDOMINAL HYSTERECTOMY     Comment:  PARTIAL 1976: BREAST CYST EXCISION; Right     Comment:  cyst removed-neg No date: CATARACT EXTRACTION; Bilateral 03/02/2017: COLONOSCOPY WITH PROPOFOL; N/A     Comment:  Procedure: COLONOSCOPY WITH PROPOFOL;  Surgeon: Lucilla Lame, MD;  Location: [facility];  Service:               Endoscopy;  Laterality: N/A; 06/13/2022: COLONOSCOPY WITH PROPOFOL; N/A     Comment:  Procedure: COLONOSCOPY WITH PROPOFOL WITH BIOPSY;                Surgeon: Lucilla Lame, MD;  Location: [facility];  Service: Endoscopy;  Laterality: N/A;  SPOT USED               TO MARK ASCENDING COLON  POLYP 06/13/2022: ESOPHAGOGASTRODUODENOSCOPY (EGD) WITH PROPOFOL; N/A     Comment:  Procedure: ESOPHAGOGASTRODUODENOSCOPY (EGD) WITH               PROPOFOL;  Surgeon: Lucilla Lame, MD;  Location: [facility];  Service: Endoscopy;  Laterality: N/A;                Sleep apnea 03/02/2017: POLYPECTOMY; N/A     Comment:  Procedure: POLYPECTOMY;  Surgeon: Lucilla Lame, MD;                Location: [facility];  Service: Endoscopy;                Laterality: N/A;  BMI    Body Mass Index: 27.58 kg/m      Reproductive/Obstetrics negative OB ROS                             Anesthesia Physical Anesthesia Plan  ASA: 3  Anesthesia Plan: General ETT   Post-op Pain Management:    Induction: Intravenous  PONV Risk Score and Plan: Ondansetron, Dexamethasone, Midazolam and Treatment may vary due to age or medical condition  Airway Management Planned: Oral ETT  Additional Equipment:   Intra-op Plan:   Post-operative Plan: Extubation in OR  Informed Consent: I have reviewed the patients History and Physical, chart, labs and discussed the procedure including the risks, benefits and alternatives for the proposed anesthesia with the patient or authorized representative who has indicated his/her understanding and acceptance.     Dental Advisory Given  Plan Discussed with: Anesthesiologist, CRNA and Surgeon  Anesthesia Plan Comments: (Patient consented for risks of anesthesia including but not limited to:  - adverse reactions to medications - damage to eyes, teeth, lips or other oral mucosa - nerve damage due to positioning  - sore throat or hoarseness - Damage to heart, brain, nerves, lungs, other parts of body or loss of life  Patient voiced understanding.)       Anesthesia Quick Evaluation

## 2022-07-05 NOTE — Op Note (Signed)
PROCEDURES: 1. Hand assisted Laparoscopic Right Hemicolectomy With stapled ileocolostomy  Pre-operative Diagnosis: Right Colon CA  Post-operative Diagnosis: Same  Surgeon: Marjory Lies Curlee Bogan   Assistants: Patience RNFA Required due to the complexity of the case: for exposure and creation of the anastomosis  Anesthesia: General endotracheal anesthesia  ASA Class: 2  Surgeon: Caroleen Hamman , MD FACS  Anesthesia: Gen. with endotracheal tube   Findings: Right colon lesion, no evidence of distant metastasis, tattoo within the center of specimen Tension free anastomosis, no evidence of intraop leak and good perfusion Pt transfused 1 unit RBC since baseline hb 7.4  Estimated Blood Loss: 20cc         Drains: none         Specimens: Right colon          Complications: none          Procedure Details  The patient was seen again in the Holding Room. The benefits, complications, treatment options, and expected outcomes were discussed with the patient. The risks of bleeding, infection, recurrence of symptoms, failure to resolve symptoms,  bowel injury, any of which could require further surgery were reviewed with the patient.   The patient was taken to Operating Room, identified as Cindy Vega and the procedure verified.  A Time Out was held and the above information confirmed.  Prior to the induction of general anesthesia, antibiotic prophylaxis was administered. VTE prophylaxis was in place. General endotracheal anesthesia was then administered and tolerated well. After the induction, the abdomen was prepped with Chloraprep and draped in the sterile fashion. The patient was positioned in the supine position.  7 cm incision was created as a midline mini laparotomy. The abdominal cavity was entered under direct visualization and the GelPort device was placed. two 5 mm ports were placed  under direct visualization and pneumoperitoneum was obtained.  The more dynamic changes were observed. The  greater omentum was divided and the hepatic flexure was taken down using harmonic scalpel.  The white line of Toldt was incised and a lateral to medial dissection was performed.  We identified the right ureter as well as the duodenum and preserve both structures at all times. We Were also able to mobilize the attachments of the cecum and terminal ileum.  Once we had an adequate mobilization were able to remove the GelPort and exteriorized the right colon.  A 10 cm margin on the terminal ileum was identified and we created a window with electrocautery and divided the terminal ileum.  Attention then was turned to the distal excision margin.  We identified the middle colic artery on selected a spot right to the middle colic artery.  Were able to also use a 75 GIA stapler to divide this area.  The mesentery was scored with electrocautery.  We identified the right colic artery and suture ligated with 2 oh silks in the standard fashion.  The rest of the mesentery was divided using the harmonic scalpel.  Please note that we went as low as possible to the base of the mesentery to obtain adequate lymph nodes and adequate margins of dissection. Specimen was passed and sent to permanent pathology.  A standard side-to-side functional end to end staple anastomosis was created with multiple loads of a 75 GIA stapler device. Common channel closed w interrupted 2-0 vicryls.  We check for patency as well as leak.  There was a tension-free anastomosis with good perfusion and no evidence of intraoperative leak.    We changed  gloves and place a clean closure tray.   Liposomal Marcaine was injected throughout the abdominal wall on both sides under direct visualization and palpation.  The fascia was closed with a running 0 PDS using the small bite techniques.  Incisions were closed staples in a standard fashion.  Needle and laparotomy counts were correct and there were no immediate complications     Caroleen Hamman, MD, FACS

## 2022-07-06 ENCOUNTER — Encounter: Payer: Self-pay | Admitting: Surgery

## 2022-07-06 LAB — BASIC METABOLIC PANEL
Anion gap: 6 (ref 5–15)
BUN: 18 mg/dL (ref 8–23)
CO2: 25 mmol/L (ref 22–32)
Calcium: 8.4 mg/dL — ABNORMAL LOW (ref 8.9–10.3)
Chloride: 101 mmol/L (ref 98–111)
Creatinine, Ser: 1.44 mg/dL — ABNORMAL HIGH (ref 0.44–1.00)
GFR, Estimated: 38 mL/min — ABNORMAL LOW (ref >60)
Glucose, Bld: 146 mg/dL — ABNORMAL HIGH (ref 70–99)
Potassium: 3.5 mmol/L (ref 3.5–5.1)
Sodium: 132 mmol/L — ABNORMAL LOW (ref 135–145)

## 2022-07-06 LAB — CBC
HCT: 25.4 % — ABNORMAL LOW (ref 36.0–46.0)
Hemoglobin: 8.1 g/dL — ABNORMAL LOW (ref 12.0–15.0)
MCH: 23.8 pg — ABNORMAL LOW (ref 26.0–34.0)
MCHC: 31.9 g/dL (ref 30.0–36.0)
MCV: 74.7 fL — ABNORMAL LOW (ref 80.0–100.0)
Platelets: 331 10*3/uL (ref 150–400)
RBC: 3.4 MIL/uL — ABNORMAL LOW (ref 3.87–5.11)
RDW: 15 % (ref 11.5–15.5)
WBC: 13.2 10*3/uL — ABNORMAL HIGH (ref 4.0–10.5)
nRBC: 0 % (ref 0.0–0.2)

## 2022-07-06 LAB — PREPARE RBC (CROSSMATCH)

## 2022-07-06 LAB — MAGNESIUM: Magnesium: 1.9 mg/dL (ref 1.7–2.4)

## 2022-07-06 LAB — PHOSPHORUS: Phosphorus: 4.9 mg/dL — ABNORMAL HIGH (ref 2.5–4.6)

## 2022-07-06 MED ORDER — ACETAMINOPHEN 500 MG PO TABS
ORAL_TABLET | ORAL | Status: AC
  Filled 2022-07-06: qty 2

## 2022-07-06 MED ORDER — KETOROLAC TROMETHAMINE 15 MG/ML IJ SOLN
INTRAMUSCULAR | Status: AC
  Filled 2022-07-06: qty 1

## 2022-07-06 MED ORDER — ACETAMINOPHEN 500 MG PO TABS
ORAL_TABLET | ORAL | Status: AC
  Administered 2022-07-06: 1000 mg via ORAL
  Filled 2022-07-06: qty 2

## 2022-07-06 MED ORDER — SODIUM CHLORIDE 0.9 % IV SOLN
INTRAVENOUS | Status: AC
  Filled 2022-07-06: qty 2

## 2022-07-06 MED ORDER — PANTOPRAZOLE SODIUM 40 MG PO TBEC
DELAYED_RELEASE_TABLET | ORAL | Status: AC
  Administered 2022-07-06: 40 mg
  Filled 2022-07-06: qty 1

## 2022-07-06 MED ORDER — ENOXAPARIN SODIUM 40 MG/0.4ML IJ SOSY
PREFILLED_SYRINGE | INTRAMUSCULAR | Status: AC
  Filled 2022-07-06: qty 0.4

## 2022-07-06 MED ORDER — ALVIMOPAN 12 MG PO CAPS
12.0000 mg | ORAL_CAPSULE | Freq: Two times a day (BID) | ORAL | Status: DC
  Administered 2022-07-06: 12 mg via ORAL

## 2022-07-06 MED ORDER — ALVIMOPAN 12 MG PO CAPS
ORAL_CAPSULE | ORAL | Status: AC
  Filled 2022-07-06: qty 1

## 2022-07-06 MED ORDER — ALVIMOPAN 12 MG PO CAPS
ORAL_CAPSULE | ORAL | Status: AC
  Administered 2022-07-06: 12 mg
  Filled 2022-07-06: qty 1

## 2022-07-06 MED ORDER — KETOROLAC TROMETHAMINE 15 MG/ML IJ SOLN
INTRAMUSCULAR | Status: AC
  Administered 2022-07-06: 15 mg via INTRAVENOUS
  Filled 2022-07-06: qty 1

## 2022-07-06 MED ORDER — OXYCODONE HCL 5 MG PO TABS
ORAL_TABLET | ORAL | Status: AC
  Filled 2022-07-06: qty 1

## 2022-07-06 MED ORDER — ALVIMOPAN 12 MG PO CAPS: 12.0000 mg | ORAL_CAPSULE | Freq: Two times a day (BID) | ORAL | Status: DC

## 2022-07-06 MED ORDER — KETOROLAC TROMETHAMINE 30 MG/ML IJ SOLN
INTRAMUSCULAR | Status: AC
  Administered 2022-07-06: 30 mg
  Filled 2022-07-06: qty 1

## 2022-07-06 NOTE — Progress Notes (Signed)
patient just used the bathroom and told me that there was bright red blood. She said it was in the toilet (made the water turn red) but was unable to tell me how much blood. and when she wiped it was some on the tissue, but not the second time she wiped. her h/h this am was 8.1/25.4. no dizziness, no pain, did not strain. her bp: 131/60 pulse: 75 98% RA   MD made aware, no new orders.

## 2022-07-06 NOTE — Anesthesia Postprocedure Evaluation (Addendum)
Anesthesia Post Note  Patient: Cindy Vega  Procedure(s) Performed: LAPAROSCOPIC RIGHT COLECTOMY hand assisted, RNFA to assist  Patient location during evaluation: PACU Anesthesia Type: General Level of consciousness: awake and alert Pain management: pain level controlled Vital Signs Assessment: post-procedure vital signs reviewed and stable Respiratory status: spontaneous breathing, nonlabored ventilation, respiratory function stable and patient connected to nasal cannula oxygen Cardiovascular status: blood pressure returned to baseline and stable Postop Assessment: no apparent nausea or vomiting Anesthetic complications: no   No notable events documented.   Last Vitals:  Vitals:   07/06/22 0725 07/06/22 1455  BP: 134/64 117/60  Pulse: 76 84  Resp: 18 16  Temp: 36.7 C 37 C  SpO2: 98% 98%    Last Pain:  Vitals:   07/06/22 1455  TempSrc: Temporal  PainSc: 0-No pain                 Precious Haws Shakhia Gramajo

## 2022-07-06 NOTE — Progress Notes (Signed)
[place] [facility] Day(s): 1.   Post op day(s): 1 Day Post-Op.   Interval History:  Patient seen and examined No acute events or new complaints overnight.  Patient reports she is feeling good this morning No abdominal pain; nausea, emesis Mild leukocytosis to 13.2K Hgb to 8.1; suspect dilution Mild AKI; sCr - 1.44; UO - 600 ccs No significant electrolyte derangements  She is on FLD; tolerating well No flatus yet Has not been OOB  Vital signs in last 24 hours: [min-max] current  Temp:  [97 F (36.1 C)-98.1 F (36.7 C)] 98.1 F (36.7 C) (12/13 0725) Pulse Rate:  [70-88] 76 (12/13 0725) Resp:  [12-24] 18 (12/13 0725) BP: (102-154)/(61-74) 134/64 (12/13 0725) SpO2:  [92 %-100 %] 98 % (12/13 0725)     Height: '5\' 6"'$  (167.6 cm) Weight: 77.5 kg BMI (Calculated): 27.59   Intake/Output last 2 shifts:  12/12 0701 - 12/13 0700 In: 1847.2 [I.V.:1300; Blood:350; IV Piggyback:197.2] Out: 650 [Urine:600; Blood:50]   Physical Exam:  Constitutional: alert, cooperative and no distress  Respiratory: breathing non-labored at rest  Cardiovascular: regular rate and sinus rhythm  Gastrointestinal: soft, non-tender, and non-distended, no rebound/guarding Genitourinary: foley in place  Integumentary: Laparoscopic and mini laparotomy incisions are CDI with staples and honeycomb dressing; no drainage   Labs:     Latest Ref Rng & Units 07/06/2022    5:12 AM 07/05/2022    4:43 PM 06/28/2022    2:29 PM  CBC  WBC 4.0 - 10.5 K/uL 13.2  14.2  6.7   Hemoglobin 12.0 - 15.0 g/dL 8.1  9.3  7.4   Hematocrit 36.0 - 46.0 % 25.4  29.3  24.2   Platelets 150 - 400 K/uL 331  384  376       Latest Ref Rng & Units 07/06/2022    5:12 AM 07/05/2022    4:43 PM 06/13/2022    3:22 PM  CMP  Glucose 70 - 99 mg/dL 146   115   BUN 8 - 23 mg/dL 18   11   Creatinine 0.44 - 1.00 mg/dL 1.44  1.27  0.78   Sodium 135 - 145 mmol/L 132   135   Potassium 3.5 - 5.1 mmol/L 3.5    3.4   Chloride 98 - 111 mmol/L 101   99   CO2 22 - 32 mmol/L 25   24   Calcium 8.9 - 10.3 mg/dL 8.4   9.4   Total Protein 6.5 - 8.1 g/dL   7.6   Total Bilirubin 0.3 - 1.2 mg/dL   0.8   Alkaline Phos 38 - 126 U/L   51   AST 15 - 41 U/L   33   ALT 0 - 44 U/L   39     Imaging studies: No new pertinent imaging studies   Assessment/Plan:  76 y.o. female with mild AKI otherwise doing well 1 Day Post-Op s/p hand assisted laparoscopic right colectomy for right colon cancer.   - Continue FLD for now. As soon as bowel function returns (ie: passes flatus), we will advance  - Continue IVF resuscitation for now given slight AKI  - Discontinue foley catheter  - Continue Entereg for now; DC once passing flatus  - Complete peri-operative Abx  - Monitor abdominal examination; on-going bowel function - Pain control prn; antiemetics prn   - Monitor leukocytosis; likely reactive - Monitor H&H; responded to initial transfusion, likely dilutional, low suspicion for bleeding - Monitor renal  function; AKI - OOB; mobilize  - Discharge Planning; Not ready for DC yet, awaiting ROBF, improvement in AKI. Anticipate ready in next 24-48 hours    All of the above findings and recommendations were discussed with the patient, patient's family at bedside (husband, daughter), and the medical team, and all of patient's and family's questions were answered to their expressed satisfaction.  -- Edison Simon, PA-C [facility] 07/06/2022, 8:26 AM M-F: 7am - 4pm

## 2022-07-07 LAB — CBC
HCT: 20.1 % — ABNORMAL LOW (ref 36.0–46.0)
HCT: 25.1 % — ABNORMAL LOW (ref 36.0–46.0)
Hemoglobin: 6.4 g/dL — ABNORMAL LOW (ref 12.0–15.0)
Hemoglobin: 8.3 g/dL — ABNORMAL LOW (ref 12.0–15.0)
MCH: 23.8 pg — ABNORMAL LOW (ref 26.0–34.0)
MCH: 25.6 pg — ABNORMAL LOW (ref 26.0–34.0)
MCHC: 31.8 g/dL (ref 30.0–36.0)
MCHC: 33.1 g/dL (ref 30.0–36.0)
MCV: 74.7 fL — ABNORMAL LOW (ref 80.0–100.0)
MCV: 77.5 fL — ABNORMAL LOW (ref 80.0–100.0)
Platelets: 348 10*3/uL (ref 150–400)
Platelets: 249 10*3/uL (ref 150–400)
RBC: 2.69 MIL/uL — ABNORMAL LOW (ref 3.87–5.11)
RBC: 3.24 MIL/uL — ABNORMAL LOW (ref 3.87–5.11)
RDW: 15.5 % (ref 11.5–15.5)
RDW: 16.2 % — ABNORMAL HIGH (ref 11.5–15.5)
WBC: 13.6 10*3/uL — ABNORMAL HIGH (ref 4.0–10.5)
WBC: 11.2 10*3/uL — ABNORMAL HIGH (ref 4.0–10.5)
nRBC: 0 % (ref 0.0–0.2)
nRBC: 0 % (ref 0.0–0.2)

## 2022-07-07 LAB — BASIC METABOLIC PANEL
Anion gap: 8 (ref 5–15)
BUN: 18 mg/dL (ref 8–23)
CO2: 23 mmol/L (ref 22–32)
Calcium: 7.5 mg/dL — ABNORMAL LOW (ref 8.9–10.3)
Chloride: 96 mmol/L — ABNORMAL LOW (ref 98–111)
Creatinine, Ser: 1.21 mg/dL — ABNORMAL HIGH (ref 0.44–1.00)
GFR, Estimated: 47 mL/min — ABNORMAL LOW (ref >60)
Glucose, Bld: 142 mg/dL — ABNORMAL HIGH (ref 70–99)
Potassium: 2.8 mmol/L — ABNORMAL LOW (ref 3.5–5.1)
Sodium: 127 mmol/L — ABNORMAL LOW (ref 135–145)

## 2022-07-07 LAB — PREPARE RBC (CROSSMATCH)

## 2022-07-07 MED ORDER — ACETAMINOPHEN 500 MG PO TABS
ORAL_TABLET | ORAL | Status: AC
  Filled 2022-07-07: qty 1

## 2022-07-07 MED ORDER — OXYCODONE HCL 5 MG PO TABS
ORAL_TABLET | ORAL | Status: AC
  Filled 2022-07-07: qty 1

## 2022-07-07 MED ORDER — PROCHLORPERAZINE MALEATE 10 MG PO TABS
ORAL_TABLET | ORAL | Status: AC
  Filled 2022-07-07: qty 1

## 2022-07-07 MED ORDER — PANTOPRAZOLE 80MG IVPB - SIMPLE MED
80.0000 mg | Freq: Two times a day (BID) | INTRAVENOUS | Status: AC
  Administered 2022-07-07 – 2022-07-08 (×4): 80 mg via INTRAVENOUS
  Filled 2022-07-07 (×4): qty 100

## 2022-07-07 MED ORDER — PANTOPRAZOLE SODIUM 40 MG IV SOLR
INTRAVENOUS | Status: AC
  Filled 2022-07-07: qty 20

## 2022-07-07 MED ORDER — SODIUM CHLORIDE 0.9% IV SOLUTION: Freq: Once | INTRAVENOUS | Status: AC

## 2022-07-07 MED ORDER — ONDANSETRON 4 MG PO TBDP
ORAL_TABLET | ORAL | Status: AC
  Filled 2022-07-07: qty 1

## 2022-07-07 MED ORDER — ACETAMINOPHEN 500 MG PO TABS
ORAL_TABLET | ORAL | Status: AC
  Filled 2022-07-07: qty 2

## 2022-07-07 MED ORDER — POTASSIUM CHLORIDE CRYS ER 20 MEQ PO TBCR
EXTENDED_RELEASE_TABLET | ORAL | Status: AC
  Administered 2022-07-07: 20 meq via ORAL
  Filled 2022-07-07: qty 1

## 2022-07-07 MED ORDER — POTASSIUM CHLORIDE CRYS ER 20 MEQ PO TBCR
EXTENDED_RELEASE_TABLET | ORAL | Status: AC
  Filled 2022-07-07: qty 1

## 2022-07-07 MED ORDER — SODIUM CHLORIDE 0.9 % IV SOLN: INTRAVENOUS | Status: DC

## 2022-07-07 MED ORDER — POTASSIUM CHLORIDE CRYS ER 20 MEQ PO TBCR
20.0000 meq | EXTENDED_RELEASE_TABLET | Freq: Two times a day (BID) | ORAL | Status: DC
  Administered 2022-07-07 – 2022-07-09 (×4): 20 meq via ORAL
  Filled 2022-07-07 (×2): qty 1

## 2022-07-07 MED ORDER — ONDANSETRON 4 MG PO TBDP
ORAL_TABLET | ORAL | Status: AC
  Administered 2022-07-07: 4 mg
  Filled 2022-07-07: qty 1

## 2022-07-07 NOTE — Progress Notes (Signed)
Patient has tolerated clear liquid diet with the help of anti-nausea medications. Ambulated in the hallway x 3. No complaints while ambulating. No other issues at this time.

## 2022-07-07 NOTE — Progress Notes (Addendum)
POD # 2 Had two bloody BMs Feeling nauseated HD ok Dropped hb Transfused 1 unit will get another in No further bloody bm this am  PE NAD Abd: soft, incision c/d/I  A/P transfuse one more unit Not able to dc CLD DC lovenox and toradol Hb check after second unit

## 2022-07-07 NOTE — Progress Notes (Signed)
Patient had two large episodes of bright red loose blood with cramping within 10 mins, Attending and on call MD notified, left message for call back.  Awaiting directives.

## 2022-07-08 LAB — BPAM RBC
Blood Product Expiration Date: 202312222359
Blood Product Expiration Date: 202401072359
Blood Product Expiration Date: 202401122359
Blood Product Expiration Date: 202401122359
Blood Product Expiration Date: 202401142359
Blood Product Expiration Date: 202401142359
ISSUE DATE / TIME: 202312121230
ISSUE DATE / TIME: 202312140241
ISSUE DATE / TIME: 202312140909
Unit Type and Rh: 6200
Unit Type and Rh: 6200
Unit Type and Rh: 6200
Unit Type and Rh: 6200
Unit Type and Rh: 6200
Unit Type and Rh: 6200

## 2022-07-08 LAB — TYPE AND SCREEN
ABO/RH(D): A POS
Antibody Screen: NEGATIVE
Unit division: 0
Unit division: 0
Unit division: 0
Unit division: 0
Unit division: 0
Unit division: 0

## 2022-07-08 LAB — CBC
HCT: 26.8 % — ABNORMAL LOW (ref 36.0–46.0)
Hemoglobin: 8.8 g/dL — ABNORMAL LOW (ref 12.0–15.0)
MCH: 25.9 pg — ABNORMAL LOW (ref 26.0–34.0)
MCHC: 32.8 g/dL (ref 30.0–36.0)
MCV: 78.8 fL — ABNORMAL LOW (ref 80.0–100.0)
Platelets: 267 10*3/uL (ref 150–400)
RBC: 3.4 MIL/uL — ABNORMAL LOW (ref 3.87–5.11)
RDW: 16.3 % — ABNORMAL HIGH (ref 11.5–15.5)
WBC: 9.6 10*3/uL (ref 4.0–10.5)
nRBC: 0 % (ref 0.0–0.2)

## 2022-07-08 LAB — POTASSIUM: Potassium: 3.7 mmol/L (ref 3.5–5.1)

## 2022-07-08 MED ORDER — PANTOPRAZOLE SODIUM 40 MG PO TBEC: 40.0000 mg | DELAYED_RELEASE_TABLET | Freq: Two times a day (BID) | ORAL | Status: DC

## 2022-07-08 MED ORDER — ACETAMINOPHEN 500 MG PO TABS
ORAL_TABLET | ORAL | Status: AC
  Filled 2022-07-08: qty 2

## 2022-07-08 MED ORDER — PANTOPRAZOLE SODIUM 40 MG PO TBEC
DELAYED_RELEASE_TABLET | ORAL | Status: AC
  Administered 2022-07-08: 40 mg
  Filled 2022-07-08: qty 1

## 2022-07-08 MED ORDER — SODIUM CHLORIDE 0.9 % IV SOLN: INTRAVENOUS | Status: DC | PRN

## 2022-07-08 MED ORDER — POTASSIUM CHLORIDE CRYS ER 20 MEQ PO TBCR
EXTENDED_RELEASE_TABLET | ORAL | Status: AC
  Administered 2022-07-08: 20 meq via ORAL
  Filled 2022-07-08: qty 1

## 2022-07-08 NOTE — Progress Notes (Addendum)
[facility] Hospital Day(s): 3.   Post op day(s): 3 Days Post-Op.   Interval History:  Patient seen and examined No acute events or new complaints overnight.  Patient reports she is feeling good Had another episode of blood in her stool; volume less; she described this as darker No fever, chills, nausea, emesis Leukocytosis resolved; now 9.6K Hgb improved today; 8.8 (8.4 s/p transfusion 1 unit pRBCs on 12/14) Good UO She is on CLD; tolerated well Mobilizing   Vital signs in last 24 hours: [min-max] current  Temp:  [97.6 F (36.4 C)-97.9 F (36.6 C)] 97.8 F (36.6 C) (12/14 1945) Pulse Rate:  [73-78] 76 (12/14 1945) Resp:  [18-20] 18 (12/14 1945) BP: (126-141)/(52-72) 141/68 (12/14 1945) SpO2:  [94 %-98 %] 96 % (12/14 1945)     Height: '5\' 6"'$  (167.6 cm) Weight: 77.5 kg BMI (Calculated): 27.59   Intake/Output last 2 shifts:  12/14 0701 - 12/15 0700 In: 360 [Blood:360] Out: -    Physical Exam:  Constitutional: alert, cooperative and no distress  Respiratory: breathing non-labored at rest  Cardiovascular: regular rate and sinus rhythm  Gastrointestinal: soft, non-tender, non-distended, no rebound/guarding Integumentary: Laparoscopic and mini laparotomy incisions are CDI with staples and honeycomb dressing; no drainage    Labs:     Latest Ref Rng & Units 07/08/2022    8:30 AM 07/07/2022   12:55 PM 07/07/2022    2:28 AM  CBC  WBC 4.0 - 10.5 K/uL 9.6  11.2  13.6   Hemoglobin 12.0 - 15.0 g/dL 8.8  8.3  6.4   Hematocrit 36.0 - 46.0 % 26.8  25.1  20.1   Platelets 150 - 400 K/uL 267  249  348       Latest Ref Rng & Units 07/07/2022    2:28 AM 07/06/2022    5:12 AM 06/13/2022    3:22 PM  CMP  Glucose 70 - 99 mg/dL 142  146  115   BUN 8 - 23 mg/dL '18  18  11   '$ Creatinine 0.44 - 1.00 mg/dL 1.21  1.44  0.78   Sodium 135 - 145 mmol/L 127  132  135   Potassium 3.5 - 5.1 mmol/L 2.8  3.5  3.4   Chloride 98 - 111 mmol/L 96  101  99    CO2 22 - 32 mmol/L '23  25  24   '$ Calcium 8.9 - 10.3 mg/dL 7.5  8.4  9.4   Total Protein 6.5 - 8.1 g/dL   7.6   Total Bilirubin 0.3 - 1.2 mg/dL   0.8   Alkaline Phos 38 - 126 U/L   51   AST 15 - 41 U/L   33   ALT 0 - 44 U/L   39     Imaging studies: No new pertinent imaging studies   Assessment/Plan:  76 y.o. female  3 Days Post-Op s/p hand assisted laparoscopic right colectomy for right colon cancer.    - Full liquids; advance as tolerated if doing well today - Wean from IVF   - Monitor abdominal examination; on-going bowel function - Pain control prn; antiemetics prn              - Monitor leukocytosis; resolved - Monitor H&H; further improvement this AM  - Mobilize; doing well independently    - Discharge Planning: Will keep another 24 hours to monitor blood in stool. Hgb improved and has maintained hemodynamics without issues.    All of  the above findings and recommendations were discussed with the patient, and the medical team, and all of patient's questions were answered to her expressed satisfaction.  -- Edison Simon, PA-C [facility] 07/08/2022, 8:53 AM M-F: 7am - 4pm  Patient seen and examined.  I agree with Mr. Elissa Hefty.  She did have a little bit of melanotic stool this morning but hemodynamics are fine.  Following that melanotic stool she had a normal bowel movement.  She feels better overall her hemoglobin is stable and there is no need for transfusion. I do think I want to watch her 1 or 2 more days depending on her condition. Continue to hold Toradol and Lovenox. Pathology discussed with her in detail she is very appreciative

## 2022-07-08 NOTE — Discharge Instructions (Signed)
In addition to included general post-operative instructions,  Diet: Resume home diet.   Activity: No heavy lifting >20 pounds (children, pets, laundry, garbage) or strenuous activity for 6 weeks, but light activity and walking are encouraged. Do not drive or drink alcohol if taking narcotic pain medications or having pain that might distract from driving.  Wound care: You may shower/get incision wet with soapy water and pat dry (do not rub incisions), but no baths or submerging incision underwater until follow-up.   Medications: Resume all home medications. For mild to moderate pain: acetaminophen (Tylenol) or ibuprofen/naproxen (if no kidney disease). Combining Tylenol with alcohol can substantially increase your risk of causing liver disease. Narcotic pain medications, if prescribed, can be used for severe pain, though may cause nausea, constipation, and drowsiness. Do not combine Tylenol and Percocet (or similar) within a 6 hour period as Percocet (and similar) contain(s) Tylenol. If you do not need the narcotic pain medication, you do not need to fill the prescription.  Call office [phone]) at any time if any questions, worsening pain, fevers/chills, bleeding, drainage from incision site, or other concerns.

## 2022-07-09 LAB — BASIC METABOLIC PANEL
Anion gap: 6 (ref 5–15)
BUN: 7 mg/dL — ABNORMAL LOW (ref 8–23)
CO2: 24 mmol/L (ref 22–32)
Calcium: 8.6 mg/dL — ABNORMAL LOW (ref 8.9–10.3)
Chloride: 105 mmol/L (ref 98–111)
Creatinine, Ser: 0.88 mg/dL (ref 0.44–1.00)
GFR, Estimated: >60 mL/min (ref >60)
Glucose, Bld: 92 mg/dL (ref 70–99)
Potassium: 3.7 mmol/L (ref 3.5–5.1)
Sodium: 135 mmol/L (ref 135–145)

## 2022-07-09 LAB — CBC
HCT: 25.6 % — ABNORMAL LOW (ref 36.0–46.0)
Hemoglobin: 8.3 g/dL — ABNORMAL LOW (ref 12.0–15.0)
MCH: 25.9 pg — ABNORMAL LOW (ref 26.0–34.0)
MCHC: 32.4 g/dL (ref 30.0–36.0)
MCV: 80 fL (ref 80.0–100.0)
Platelets: 266 10*3/uL (ref 150–400)
RBC: 3.2 MIL/uL — ABNORMAL LOW (ref 3.87–5.11)
RDW: 17.2 % — ABNORMAL HIGH (ref 11.5–15.5)
WBC: 8.2 10*3/uL (ref 4.0–10.5)
nRBC: 0 % (ref 0.0–0.2)

## 2022-07-09 MED ORDER — ACETAMINOPHEN 500 MG PO TABS: 1000.0000 mg | ORAL_TABLET | Freq: Four times a day (QID) | ORAL | Status: DC | PRN

## 2022-07-09 MED ORDER — OXYCODONE HCL 5 MG PO TABS: 5.0000 mg | ORAL_TABLET | Freq: Four times a day (QID) | ORAL | 0 refills | Status: DC | PRN

## 2022-07-09 MED ORDER — IBUPROFEN 600 MG PO TABS: 600.0000 mg | ORAL_TABLET | Freq: Three times a day (TID) | ORAL | 1 refills | Status: DC | PRN

## 2022-07-09 NOTE — Discharge Summary (Signed)
Patient ID: Cindy Vega MRN: [id number] DOB/AGE: [birth year] 76 y.o.  Admit date: 07/05/2022 Discharge date: 07/09/2022   Discharge Diagnoses:  Principal Problem:   Cancer of right colon [facility]) Active Problems:   Colonic mass   Procedures:  Hand assisted laparoscopic right colectomy  Hospital Course: Patient presented on 07/05/22 for schedule surgery and had a hand assisted laparoscopic right colectomy with Dr. Dahlia Byes on same day.  Post-operatively, she had bloody bowel movements on POD#1 and required transfusion of 2 pRBC total.  The bleeding resolved on its own and her Hgb stabilized after transfusion.  Her diet was then advanced and she has been tolerating well, with no worsening pain, no further bleeding.  On exam, her abdomen is soft, non-distended, appropriately tender.  Incisions are clean, dry, intact, without evidence of infection, staples in place.  [name] ambulating, voiding on her own, tolerating a diet, and will be discharged home today.  Follow up in 1 week for staple removal.  Consults: None  Disposition: Discharge disposition: 01-Home or Self Care       Discharge Instructions     Call MD for:  difficulty breathing, headache or visual disturbances   Complete by: As directed    Call MD for:  persistant nausea and vomiting   Complete by: As directed    Call MD for:  redness, tenderness, or signs of infection (pain, swelling, redness, odor or green/yellow discharge around incision site)   Complete by: As directed    Call MD for:  severe uncontrolled pain   Complete by: As directed    Call MD for:  temperature >100.4   Complete by: As directed    Diet general   Complete by: As directed    Continue soft diet for an additional 2 days, then may start advancing to your normal foods.   Discharge instructions   Complete by: As directed    1.  Patient may shower, but do not scrub wounds heavily and dab dry only. 2.  Do not submerge wounds in pool/tub until fully  healed. 3.  May apply ice packs to the wounds for comfort. 4.  Do not remove your staples.   Driving Restrictions   Complete by: As directed    Do not drive while taking narcotics for pain control.  Prior to driving, make sure you are able to rotate right and left to look at blindspots without significant pain or discomfort.   Increase activity slowly   Complete by: As directed    Lifting restrictions   Complete by: As directed    No heavy lifting or pushing of more than 10-15 lbs for 4 weeks.   No dressing needed   Complete by: As directed       Allergies as of 07/09/2022       Reactions   Silver Nitrate    LIQUID NITRATE used to remove warts        Medication List     TAKE these medications    acetaminophen 500 MG tablet Commonly known as: TYLENOL Take 2 tablets (1,000 mg total) by mouth every 6 (six) hours as needed for mild pain.   aspirin 81 MG tablet Take by mouth every other day.   atorvastatin 20 MG tablet Commonly known as: LIPITOR TAKE 1 TABLET EVERY DAY   Biotin 5 MG Caps daily.   cholecalciferol 1000 units tablet Commonly known as: VITAMIN D Take 1,000 Units by mouth daily.   Fish Oil 1000 MG Caps daily.  ibuprofen 600 MG tablet Commonly known as: ADVIL Take 1 tablet (600 mg total) by mouth every 8 (eight) hours as needed for moderate pain.   latanoprost 0.005 % ophthalmic solution Commonly known as: XALATAN 1 drop at bedtime.   MULTIPLE VITAMIN PO Take by mouth daily.   ondansetron 4 MG tablet Commonly known as: Zofran Take 1 tablet (4 mg total) by mouth every 8 (eight) hours as needed for nausea or vomiting.   oxyCODONE 5 MG immediate release tablet Commonly known as: Oxy IR/ROXICODONE Take 1 tablet (5 mg total) by mouth every 6 (six) hours as needed for severe pain.   psyllium 58.6 % packet Commonly known as: METAMUCIL Take 1 packet by mouth daily.   triamterene-hydrochlorothiazide 37.5-25 MG tablet Commonly known as:  MAXZIDE-25 TAKE 1 TABLET EVERY DAY               Discharge Care Instructions  (From admission, onward)           Start     Ordered   07/09/22 0000  No dressing needed        07/09/22 1149            Follow-up Information     Pabon, [place] F, MD. Schedule an appointment as soon as possible for a visit in 1 week(s).   Specialty: General Surgery Why: s/p right colectomy, has staples....can see Thedore Mins if needed Contact information: [address] [place] [address] [phone]

## 2022-07-11 ENCOUNTER — Telehealth: Payer: Self-pay

## 2022-07-11 NOTE — Patient Outreach (Signed)
  Care Coordination TOC Note Transition Care Management Follow-up Telephone Call Date of discharge and from where: 07/09/22-[facility]   Dx: "cancer of right colon s/p colectomy" How have you been since you were released from the hospital? Patient reports that she is doing well and and very thankful that she does not have to undergo any chemo radiation txs. Pain is controlled and managed with Tylenol. She has not taken any Oxycodone since coming home. She is adjusting to having to sleep on her back for the next few weeks until surgical site heals. Her appetite is coming back. She is passing gas and moving bowels almost daily without any issues.  Any questions or concerns? No  Items Reviewed: Did the pt receive and understand the discharge instructions provided? Yes  Medications obtained and verified? Yes  Other? Yes -post-op care Any new allergies since your discharge? No  Dietary orders reviewed? Yes Do you have support at home? Yes   Home Care and Equipment/Supplies: Were home health services ordered? not applicable If so, what is the name of the agency? N/A  Has the agency set up a time to come to the patient's home? not applicable Were any new equipment or medical supplies ordered?  No What is the name of the medical supply agency? N/A Were you able to get the supplies/equipment? not applicable Do you have any questions related to the use of the equipment or supplies? No  Functional Questionnaire: (I = Independent and D = Dependent) ADLs: I  Bathing/Dressing- I  Meal Prep- I  Eating- I  Maintaining continence- I  Transferring/Ambulation- I  Managing Meds- I  Follow up appointments reviewed:  PCP Hospital f/u appt confirmed? No  . [facility] f/u appt confirmed? Yes  Scheduled to see Dr. Delena Bali on 07/14/22 @ 3:15pm. Are transportation arrangements needed? No  If their condition worsens, is the pt aware to call PCP or go to the Emergency Dept.? Yes Was the patient  provided with contact information for the PCP's office or ED? Yes Was to pt encouraged to call back with questions or concerns? Yes  SDOH assessments and interventions completed:   Yes SDOH Interventions Today    Flowsheet Row Most Recent Value  SDOH Interventions   Food Insecurity Interventions Intervention Not Indicated  Transportation Interventions Intervention Not Indicated       Care Coordination Interventions:  Education provided    Encounter Outcome:  Pt. Visit Completed    Enzo Montgomery, RN,BSN,CCM [place] Management Telephonic Care Management Coordinator Direct Phone: [phone] Toll Free: [phone] Fax: [phone]

## 2022-07-14 ENCOUNTER — Ambulatory Visit (INDEPENDENT_AMBULATORY_CARE_PROVIDER_SITE_OTHER): Payer: Medicare HMO | Admitting: Physician Assistant

## 2022-07-14 ENCOUNTER — Encounter: Payer: Self-pay | Admitting: Physician Assistant

## 2022-07-14 VITALS — BP 154/71 | HR 80 | Temp 98.6°F | Ht 66.0 in | Wt 165.4 lb

## 2022-07-14 DIAGNOSIS — C182 Malignant neoplasm of ascending colon: Secondary | ICD-10-CM

## 2022-07-14 DIAGNOSIS — K6389 Other specified diseases of intestine: Secondary | ICD-10-CM

## 2022-07-14 DIAGNOSIS — Z09 Encounter for follow-up examination after completed treatment for conditions other than malignant neoplasm: Secondary | ICD-10-CM

## 2022-07-14 NOTE — Progress Notes (Signed)
[name] POST-OP OFFICE VISIT  07/14/2022  HPI: Cindy Vega is a 76 y.o. female 9 days s/p hand assisted laparoscopic right colectomy for right colon cancer.   She continues to do very well No abdominal pain; only used tylenol in anticipation of staple removal No fever, chills, nausea, emesis, or bowel changes Tolerating PO Ambulating well Wound healing without issues No other complaints   Vital signs: BP (!) 154/71   Pulse 80   Temp 98.6 F (37 C) (Oral)   Ht '5\' 6"'$  (1.676 m)   Wt 165 lb 6.4 oz (75 kg)   SpO2 98%   BMI 26.70 kg/m    Physical Exam: Constitutional: Well appearing female, NAD Abdomen: Soft, non-tender, non-distended, no rebound/guarding Skin: Laparoscopic and mini laparotomy incisions are healing well with staples (removed); some ecchymosis, no erythema or drainage   Assessment/Plan: This is a 76 y.o. female 9 days s/p hand assisted laparoscopic right colectomy for right colon cancer.    - Staples removed; steri-strips placed  - Gradually return to regular diet  - Pain control prn  - Reviewed wound care recommendation  - Reviewed lifting restrictions; 6 weeks total  - She will return to see Dr Dahlia Byes in ~1 month; She, and her husband, understand to call with questions/concerns in the interim  -- Edison Simon, PA-C [facility] 07/14/2022, 3:10 PM M-F: 7am - 4pm

## 2022-07-14 NOTE — Patient Instructions (Signed)

## 2022-07-22 LAB — SURGICAL PATHOLOGY

## 2022-07-31 ENCOUNTER — Other Ambulatory Visit: Payer: Self-pay | Admitting: Family Medicine

## 2022-07-31 DIAGNOSIS — I10 Essential (primary) hypertension: Secondary | ICD-10-CM

## 2022-08-04 ENCOUNTER — Telehealth (INDEPENDENT_AMBULATORY_CARE_PROVIDER_SITE_OTHER): Payer: Medicare HMO | Admitting: Family Medicine

## 2022-08-04 ENCOUNTER — Encounter: Payer: Self-pay | Admitting: Family Medicine

## 2022-08-04 VITALS — Ht 66.0 in

## 2022-08-04 DIAGNOSIS — G4730 Sleep apnea, unspecified: Secondary | ICD-10-CM

## 2022-08-04 DIAGNOSIS — G4733 Obstructive sleep apnea (adult) (pediatric): Secondary | ICD-10-CM | POA: Diagnosis not present

## 2022-08-04 NOTE — Progress Notes (Addendum)
MyChart Video Visit   Virtual Visit via Video Note   This format is felt to be most appropriate for this patient at this time. Physical exam was limited by quality of the video and audio technology used for the visit.   Patient location: home Provider location: [facility] [address]  [address] [place], [place] [address]   I discussed the limitations of evaluation and management by telemedicine and the availability of in person appointments. The patient expressed understanding and agreed to proceed.  Patient: Cindy Vega   DOB: 10/14/1945   77 y.o. Female  MRN: [id number] Visit Date: 08/04/2022  Today's healthcare provider: Gwyneth Sprout, FNP  Re Introduced to nurse practitioner role and practice setting.  All questions answered.  Discussed provider/patient relationship and expectations.    HPI  Lab Results  Component Value Date   NA 135 07/09/2022   K 3.7 07/09/2022   CO2 24 07/09/2022   GLUCOSE 92 07/09/2022   BUN 7 (L) 07/09/2022   CREATININE 0.88 07/09/2022   CALCIUM 8.6 (L) 07/09/2022   EGFR 54 (L) 05/16/2022   GFRNONAA >60 07/09/2022   Lab Results  Component Value Date   CHOL 165 04/14/2022   HDL 74 04/14/2022   LDLCALC 70 04/14/2022   TRIG 121 04/14/2022   CHOLHDL 2.2 04/14/2022   Lab Results  Component Value Date   TSH 2.990 04/14/2022   Lab Results  Component Value Date   HGBA1C 5.8 (H) 04/14/2022   Lab Results  Component Value Date   WBC 8.2 07/09/2022   HGB 8.3 (L) 07/09/2022   HCT 25.6 (L) 07/09/2022   MCV 80.0 07/09/2022   PLT 266 07/09/2022   Lab Results  Component Value Date   ALT 39 06/13/2022   AST 33 06/13/2022   ALKPHOS 51 06/13/2022   BILITOT 0.8 06/13/2022    Review of Systems  Patient Active Problem List   Diagnosis Date Noted   Severe sleep apnea 08/04/2022   OSA on CPAP 08/04/2022   Colonic mass 07/05/2022   Cancer of right colon ([place]) 07/05/2022   Neoplasm of digestive system  06/13/2022   Iron deficiency anemia due to chronic blood loss 06/01/2022   Gout 05/16/2022   Annual physical exam 04/11/2022   Prediabetes 04/11/2022   Screening for colon cancer 04/11/2022   Need for influenza vaccination 04/11/2022   Wrist pain, acute, right 04/01/2021   Essential hypertension 04/01/2021   Visit for preventive health examination 04/01/2021   Breast cancer screening by mammogram 04/01/2021   Encounter for screening for osteoporosis 04/01/2021   Alcohol abuse 04/01/2021   Special screening for malignant neoplasms, colon    Benign neoplasm of ascending colon    Benign neoplasm of descending colon    Anxiety 10/02/2015   Abnormal liver enzymes 10/02/2015   Hypercholesteremia 10/02/2015   Blood glucose elevated 10/02/2015   Arthritis, degenerative 10/02/2015   Insomnia 09/02/2015   Family history of breast cancer in first degree relative 01/05/2012   Bloodgood disease 01/05/2012   Personal history of surgery to heart and great vessels, presenting hazards to health 01/05/2012   H/O total hysterectomy 01/05/2012   Glaucoma 11/22/2007   Hay fever 06/15/2004    Allergies  Allergen Reactions   Silver Nitrate     LIQUID NITRATE used to remove warts    Past Surgical History:  Procedure Laterality Date   ABDOMINAL HYSTERECTOMY  1996   PARTIAL   BREAST CYST EXCISION Right 1976  cyst removed-neg   CATARACT EXTRACTION Bilateral    COLONOSCOPY WITH PROPOFOL N/A 03/02/2017   Procedure: COLONOSCOPY WITH PROPOFOL;  Surgeon: Lucilla Lame, MD;  Location: [facility];  Service: Endoscopy;  Laterality: N/A;   COLONOSCOPY WITH PROPOFOL N/A 06/13/2022   Procedure: COLONOSCOPY WITH PROPOFOL WITH BIOPSY;  Surgeon: Lucilla Lame, MD;  Location: [facility];  Service: Endoscopy;  Laterality: N/A;  SPOT USED TO MARK ASCENDING COLON POLYP   ESOPHAGOGASTRODUODENOSCOPY (EGD) WITH PROPOFOL N/A 06/13/2022   Procedure: ESOPHAGOGASTRODUODENOSCOPY (EGD) WITH PROPOFOL;   Surgeon: Lucilla Lame, MD;  Location: [facility];  Service: Endoscopy;  Laterality: N/A;  Sleep apnea   LAPAROSCOPIC RIGHT COLECTOMY N/A 07/05/2022   Procedure: LAPAROSCOPIC RIGHT COLECTOMY hand assisted, RNFA to assist;  Surgeon: Jules Husbands, MD;  Location: [facility];  Service: General;  Laterality: N/A;   POLYPECTOMY N/A 03/02/2017   Procedure: POLYPECTOMY;  Surgeon: Lucilla Lame, MD;  Location: [facility] [place];  Service: Endoscopy;  Laterality: N/A;    Social History   Tobacco Use   Smoking status: Never   Smokeless tobacco: Never  Vaping Use   Vaping Use: Never used  Substance Use Topics   Alcohol use: Yes    Alcohol/week: 3.0 standard drinks of alcohol    Types: 3 Glasses of wine per week   Drug use: No     Medication list has been reviewed and updated.  Current Meds  Medication Sig   aspirin 81 MG tablet Take by mouth every other day.    atorvastatin (LIPITOR) 20 MG tablet TAKE 1 TABLET EVERY DAY   Biotin 5 MG CAPS daily.    cholecalciferol (VITAMIN D) 1000 units tablet Take 1,000 Units by mouth daily.    ibuprofen (ADVIL) 600 MG tablet Take 1 tablet (600 mg total) by mouth every 8 (eight) hours as needed for moderate pain.   latanoprost (XALATAN) 0.005 % ophthalmic solution 1 drop at bedtime.   MULTIPLE VITAMIN PO Take by mouth daily.    Omega-3 Fatty Acids (FISH OIL) 1000 MG CAPS daily.    psyllium (METAMUCIL) 58.6 % packet Take 1 packet by mouth daily.   triamterene-hydrochlorothiazide (MAXZIDE-25) 37.5-25 MG tablet TAKE 1 TABLET EVERY DAY   [DISCONTINUED] acetaminophen (TYLENOL) 500 MG tablet Take 2 tablets (1,000 mg total) by mouth every 6 (six) hours as needed for mild pain.   [DISCONTINUED] ondansetron (ZOFRAN) 4 MG tablet Take 1 tablet (4 mg total) by mouth every 8 (eight) hours as needed for nausea or vomiting.        No data to display             08/04/2022    8:57 AM 06/01/2022   11:16 AM 04/26/2022   10:32 AM  Depression screen PHQ  2/9  Decreased Interest 0 0 0  Down, Depressed, Hopeless 0 0 0  PHQ - 2 Score 0 0 0  Altered sleeping 0 0 0  Tired, decreased energy 0 3 0  Change in appetite 0 0 0  Feeling bad or failure about yourself  0 0 0  Trouble concentrating 0 0 0  Moving slowly or fidgety/restless 0 0 0  Suicidal thoughts 0 0 0  PHQ-9 Score 0 3 0  Difficult doing work/chores Not difficult at all Not difficult at all Not difficult at all    BP Readings from Last 3 Encounters:  07/14/22 (!) 154/71  07/09/22 (!) 153/77  07/04/22 (!) 161/77    Physical Exam Constitutional:  Appearance: Normal appearance.  HENT:     Head: Normocephalic.  Pulmonary:     Effort: Pulmonary effort is normal.  Neurological:     General: No focal deficit present.     Mental Status: She is alert and oriented to person, place, and time.  Psychiatric:        Mood and Affect: Mood normal.        Behavior: Behavior normal.        Thought Content: Thought content normal.        Judgment: Judgment normal.     Problem List Items Addressed This Visit       Respiratory   OSA on CPAP    Previously doing well with use of CPAP for severe sleep apnea until machine was no longer attaching to tubing and mask securely Not able to get new equipment/machine through insurance through [place] new referral Previously using humidified, auto titrate machine with full face mask covering nose and mouth, size small      Severe sleep apnea - Primary    AHI 40+ from [facility] completed on 05/28/18 and 05/29/18 Patient was placed on CPAP and doing well until machine was no longer fitting equipment due to age Has called her insurance and they are no longer working with [employer] She needs a new CPAP company for a replacement machine and supplies      I discussed the assessment and treatment plan with the patient. The patient was provided an opportunity to ask questions and all were answered. The patient agreed with the plan and demonstrated an  understanding of the instructions.   The patient was advised to call back or seek an in-person evaluation if the symptoms worsen or if the condition fails to improve as anticipated.  I provided 10 minutes of face-to-face time during this encounter discussing current CPAP machine concerns, need for a new maching, need for a new machine/supply provider.  Vonna Kotyk, FNP, have reviewed all documentation for this visit. The documentation on 08/05/22 for the exam, diagnosis, procedures, and orders are all accurate and complete.

## 2022-08-04 NOTE — Assessment & Plan Note (Signed)
AHI 40+ from [facility] completed on 05/28/18 and 05/29/18 Patient was placed on CPAP and doing well until machine was no longer fitting equipment due to age Has called her insurance and they are no longer working with [employer] She needs a new CPAP company for a replacement machine and supplies

## 2022-08-04 NOTE — Assessment & Plan Note (Signed)
Previously doing well with use of CPAP for severe sleep apnea until machine was no longer attaching to tubing and mask securely Not able to get new equipment/machine through insurance through [place] new referral Previously using humidified, auto titrate machine with full face mask covering nose and mouth, size small

## 2022-08-15 ENCOUNTER — Encounter: Payer: Self-pay | Admitting: Surgery

## 2022-08-15 ENCOUNTER — Other Ambulatory Visit: Payer: Self-pay

## 2022-08-15 ENCOUNTER — Other Ambulatory Visit
Admission: RE | Admit: 2022-08-15 | Discharge: 2022-08-15 | Disposition: A | Discharge status: Home / Self Care | Payer: Medicare HMO | Source: Ambulatory Visit | Attending: Surgery | Admitting: Surgery

## 2022-08-15 ENCOUNTER — Ambulatory Visit (INDEPENDENT_AMBULATORY_CARE_PROVIDER_SITE_OTHER): Payer: Medicare HMO | Admitting: Surgery

## 2022-08-15 ENCOUNTER — Telehealth: Payer: Self-pay

## 2022-08-15 VITALS — BP 161/87 | HR 79 | Temp 98.7°F | Ht 66.0 in | Wt 166.0 lb

## 2022-08-15 DIAGNOSIS — Z09 Encounter for follow-up examination after completed treatment for conditions other than malignant neoplasm: Secondary | ICD-10-CM

## 2022-08-15 DIAGNOSIS — Z08 Encounter for follow-up examination after completed treatment for malignant neoplasm: Secondary | ICD-10-CM

## 2022-08-15 DIAGNOSIS — C182 Malignant neoplasm of ascending colon: Secondary | ICD-10-CM

## 2022-08-15 DIAGNOSIS — Z48815 Encounter for surgical aftercare following surgery on the digestive system: Secondary | ICD-10-CM | POA: Insufficient documentation

## 2022-08-15 LAB — CBC WITH DIFFERENTIAL/PLATELET
Abs Immature Granulocytes: 0.03 10*3/uL (ref 0.00–0.07)
Basophils Absolute: 0 10*3/uL (ref 0.0–0.1)
Basophils Relative: 1 %
Eosinophils Absolute: 0.6 10*3/uL — ABNORMAL HIGH (ref 0.0–0.5)
Eosinophils Relative: 8 %
HCT: 36.9 % (ref 36.0–46.0)
Hemoglobin: 11.8 g/dL — ABNORMAL LOW (ref 12.0–15.0)
Immature Granulocytes: 0 %
Lymphocytes Relative: 28 %
Lymphs Abs: 1.9 10*3/uL (ref 0.7–4.0)
MCH: 26.7 pg (ref 26.0–34.0)
MCHC: 32 g/dL (ref 30.0–36.0)
MCV: 83.5 fL (ref 80.0–100.0)
Monocytes Absolute: 0.8 10*3/uL (ref 0.1–1.0)
Monocytes Relative: 11 %
Neutro Abs: 3.4 10*3/uL (ref 1.7–7.7)
Neutrophils Relative %: 52 %
Platelets: 282 10*3/uL (ref 150–400)
RBC: 4.42 MIL/uL (ref 3.87–5.11)
RDW: 21.7 % — ABNORMAL HIGH (ref 11.5–15.5)
Smear Review: NORMAL
WBC: 6.7 10*3/uL (ref 4.0–10.5)
nRBC: 0 % (ref 0.0–0.2)

## 2022-08-15 NOTE — Telephone Encounter (Signed)
Patient notified of Hemoglobin results- reminded to follow up with Oncology and PCP.

## 2022-08-15 NOTE — Patient Instructions (Signed)
Please go to the lab today. We will call you with the results. Please follow up with your PCP.  Referral to Oncology. Someone from their office will call you to schedule an appointment.  Please see your follow up appointment listed below.

## 2022-08-16 ENCOUNTER — Telehealth: Payer: Self-pay | Admitting: *Deleted

## 2022-08-16 NOTE — Telephone Encounter (Signed)
Introductory phone call made to patient regarding her referral appt in [place] to[place].  Discussed location, masking is mandatory. Pt is allowed one visitor to accompany her. Opportunity for any questions offered.

## 2022-08-17 ENCOUNTER — Encounter: Payer: Self-pay | Admitting: Internal Medicine

## 2022-08-17 ENCOUNTER — Other Ambulatory Visit: Payer: Self-pay | Admitting: *Deleted

## 2022-08-17 ENCOUNTER — Encounter: Payer: Self-pay | Admitting: Surgery

## 2022-08-17 ENCOUNTER — Telehealth: Payer: Self-pay | Admitting: *Deleted

## 2022-08-17 ENCOUNTER — Inpatient Hospital Stay: Payer: Medicare HMO | Attending: Internal Medicine | Admitting: Internal Medicine

## 2022-08-17 ENCOUNTER — Inpatient Hospital Stay: Payer: Medicare HMO

## 2022-08-17 VITALS — BP 150/76 | HR 87 | Temp 97.0°F | Resp 18 | Ht 67.0 in | Wt 167.0 lb

## 2022-08-17 DIAGNOSIS — C182 Malignant neoplasm of ascending colon: Secondary | ICD-10-CM | POA: Diagnosis not present

## 2022-08-17 DIAGNOSIS — I10 Essential (primary) hypertension: Secondary | ICD-10-CM | POA: Insufficient documentation

## 2022-08-17 DIAGNOSIS — Z79899 Other long term (current) drug therapy: Secondary | ICD-10-CM | POA: Diagnosis not present

## 2022-08-17 DIAGNOSIS — K760 Fatty (change of) liver, not elsewhere classified: Secondary | ICD-10-CM | POA: Insufficient documentation

## 2022-08-17 DIAGNOSIS — Z90710 Acquired absence of both cervix and uterus: Secondary | ICD-10-CM | POA: Diagnosis not present

## 2022-08-17 DIAGNOSIS — D649 Anemia, unspecified: Secondary | ICD-10-CM | POA: Insufficient documentation

## 2022-08-17 NOTE — Progress Notes (Signed)
Cindy Vega is 5 weeks out from laparoscopic right colectomy for cancer.  She did have symptomatic anemia and required transfusions due to her cancer.  She has pending follow-up with primary care and pending oncology evaluation.  I did encourage her to talk to primary care regarding anemia as well as oncology.  She did not have any metastatic disease within the lymph node.  She is otherwise doing very well.  Tolerating diet She is otherwise doing very well w/o complications  PE NAD Abd: soft, incisions c.d/I no infection  A/ p doing very well Pcp and oncology f/u

## 2022-08-17 NOTE — Assessment & Plan Note (Addendum)
Right-sided colon cancer -stage II: 2pT3N0- [low risk]-moderately differentiated; more than 30 lymph nodes; no LVI; or perineural invasion; no perforation noted. NOV prior to surgery- CEA- 4.3  # Discussed with the patient the rationale for adjuvant chemotherapy for stage II colon cancer is unclear.  Tumor MSI stable.    # Discussed regarding use of CT DNA/[facility] testing to better understand the risk of recurrence.  If found high risk based on CT DNA-chemotherapy will be offered.  However if considered low risk, would recommend surveillance without any adjuvant chemotherapy.  Discussed regarding a colonoscopy within a year.  # Anemia likely secondary to chronic GI bleed from #1.  Today hemoglobin is 11.2.  Should improve over time.   # Re: Fatty liver:  Discussed importance of healthy weight/and weight loss.  Strongly recommend eating more green leafy vegetables and cutting down processed food/ carbohydrates.  Instead increasing whole grains / protein in the diet.  Multiple studies have shown that optimal weight would help improve cardiovascular risk; also shown to cut on the risk of malignancies-colon cancer, breast cancer ovarian/uterine cancer in women and also prostate cancer in men.   # #Incidental findings on Imaging  CT , 2024: Gallstones; diverticulosis I reviewed/discussed/counseled the patient.   # DISPOSITION: # [facility]a testing # follow up in 1 year- labs- cbc/cmp;cea- Dr.B  Thank you Dr.Pabone for allowing me to participate in the care of your pleasant patient. Please do not hesitate to contact me with questions or concerns in the interim.  # I reviewed the blood work- with the patient in detail; also reviewed the imaging independently [as summarized above]; and with the patient in detail.

## 2022-08-17 NOTE — Progress Notes (Signed)
[place] OFFICE PROGRESS NOTE  Patient Care Team: Gwyneth Sprout, FNP as PCP - General (Family Medicine) Eulogio Bear, MD as Consulting Physician (Ophthalmology) Oneta Rack, MD (Dermatology) Clent Jacks, RN as Oncology Nurse Navigator   Cancer Staging  Cancer of right colon [facility]) Staging form: Colon and Rectum, AJCC 8th Edition - Clinical: Stage IIA (cT3, cN0, cM0) - Signed by Cammie Sickle, MD on 08/17/2022 Total positive nodes: 0    Oncology History Overview Note  # December 2023-screening colonoscopy [anemia]; ascending colon mass; right hemicolectomy [Dr.Pabone]-stage II colon cancer pT3N0- [low risk]- no adjuvant therapy; MMR stable   DIAGNOSIS: A.  Colon, right; colectomy: - 3.1 CM INVASIVE MODERATELY DIFFERENTIATED ADENOCARCINOMA, MARGINS NEGATIVE, NO EVIDENCE OF METASTATIC CARCINOMA IN 32 LYMPH NODES.  CANCER CASE SUMMARY: COLON AND RECTUM Standard(s): AJCC-UICC 8  SPECIMEN Procedure: Right colectomy  TUMOR Tumor Site: Ascending colon Histologic Type: Adenocarcinoma Histologic Grade: G2, moderately differentiated Tumor Size: Greatest dimension in Centimeters: 3.1 cm Tumor Extent: Invades through muscularis propria into the pericolonic tissue. Macroscopic Tumor Perforation: Not identified Lymph-Vascular Invasion: Not identified Perineural Invasion: Not identified Treatment Effect: No known presurgical therapy  MARGINS Margin Status for Invasive Carcinoma: All margins negative for invasive carcinoma                 Closest margin to invasive carcinoma: Mesenteric.                 Distance from invasive carcinoma to closest margin: 0.5 mm REGIONAL LYMPH NODES Regional Lymph Nodes: Regional lymph nodes present                                         All regional lymph nodes negative for tumor (0/32)  Tumor Deposits: Not identified  PATHOLOGIC STAGE CLASSIFICATION (pTNM, AJCC 8th Edition): TNM Descriptors: Not  applicable pT3 pN0 pM not applicable    Cancer of right colon ([place])  07/05/2022 Initial Diagnosis   Cancer of right colon ([place])   08/17/2022 Cancer Staging   Staging form: Colon and Rectum, AJCC 8th Edition - Clinical: Stage IIA (cT3, cN0, cM0) - Signed by Cammie Sickle, MD on 08/17/2022 Total positive nodes: 0     INTERVAL HISTORY: Ambulating independently.  Accompanied by husband.  Cindy Vega 77 y.o.  female pleasant patient above history of  has been referred to [place] for further evaluation recommendations for newly diagnosed colon cancer.  Patient had a worsening anemia that led to colonoscopy that was abnormal.  Patient with right-sided colon lesion.  Patient subsequently evaluated by surgery.  In December underwent right hemicolectomy.  Patient is recovering well from surgery.  He denies any worsening shortness of breath or cough.  Denies any blood in stools or black or stools.   Review of Systems  Constitutional:  Negative for chills, diaphoresis, fever, malaise/fatigue and weight loss.  HENT:  Negative for nosebleeds and sore throat.   Eyes:  Negative for double vision.  Respiratory:  Negative for cough, hemoptysis, sputum production, shortness of breath and wheezing.   Cardiovascular:  Negative for chest pain, palpitations, orthopnea and leg swelling.  Gastrointestinal:  Negative for abdominal pain, blood in stool, constipation, diarrhea, heartburn, melena, nausea and vomiting.  Genitourinary:  Negative for dysuria, frequency and urgency.  Musculoskeletal:  Negative for back pain and joint pain.  Skin: Negative.  Negative for itching and  rash.  Neurological:  Negative for dizziness, tingling, focal weakness, weakness and headaches.  Endo/Heme/Allergies:  Does not bruise/bleed easily.  Psychiatric/Behavioral:  Negative for depression. The patient is not nervous/anxious and does not have insomnia.       PAST MEDICAL HISTORY :  Past Medical History:   Diagnosis Date   Anemia    Anxiety    Arthritis    fingers   Cancer ([place])    basal cell, COLON   Hypertension    PONV (postoperative nausea and vomiting)    after hysterectomy   Pre-diabetes    Sleep apnea    CPAP    PAST SURGICAL HISTORY :   Past Surgical History:  Procedure Laterality Date   ABDOMINAL HYSTERECTOMY  1996   PARTIAL   BREAST CYST EXCISION Right 1976   cyst removed-neg   CATARACT EXTRACTION Bilateral    COLONOSCOPY WITH PROPOFOL N/A 03/02/2017   Procedure: COLONOSCOPY WITH PROPOFOL;  Surgeon: Lucilla Lame, MD;  Location: [place];  Service: Endoscopy;  Laterality: N/A;   COLONOSCOPY WITH PROPOFOL N/A 06/13/2022   Procedure: COLONOSCOPY WITH PROPOFOL WITH BIOPSY;  Surgeon: Lucilla Lame, MD;  Location: [place];  Service: Endoscopy;  Laterality: N/A;  SPOT USED TO MARK ASCENDING COLON POLYP   ESOPHAGOGASTRODUODENOSCOPY (EGD) WITH PROPOFOL N/A 06/13/2022   Procedure: ESOPHAGOGASTRODUODENOSCOPY (EGD) WITH PROPOFOL;  Surgeon: Lucilla Lame, MD;  Location: [place];  Service: Endoscopy;  Laterality: N/A;  Sleep apnea   LAPAROSCOPIC RIGHT COLECTOMY N/A 07/05/2022   Procedure: LAPAROSCOPIC RIGHT COLECTOMY hand assisted, RNFA to assist;  Surgeon: Jules Husbands, MD;  Location: [facility];  Service: General;  Laterality: N/A;   POLYPECTOMY N/A 03/02/2017   Procedure: POLYPECTOMY;  Surgeon: Lucilla Lame, MD;  Location: [place];  Service: Endoscopy;  Laterality: N/A;    FAMILY HISTORY :   Family History  Problem Relation Age of Onset   Hypertension Mother    Heart disease Mother    Diabetes Mother    Diabetes Father    Prostate cancer Father    Breast cancer Sister 83   Graves' disease Sister    Breast cancer Sister     SOCIAL HISTORY:   Social History   Tobacco Use   Smoking status: Never   Smokeless tobacco: Never  Vaping Use   Vaping Use: Never used  Substance Use Topics   Alcohol use: Yes    Alcohol/week: 3.0  standard drinks of alcohol    Types: 3 Glasses of wine per week   Drug use: No    ALLERGIES:  is allergic to silver nitrate.  MEDICATIONS:  Current Outpatient Medications  Medication Sig Dispense Refill   aspirin 81 MG tablet Take by mouth every other day.      atorvastatin (LIPITOR) 20 MG tablet TAKE 1 TABLET EVERY DAY 90 tablet 3   Biotin 5 MG CAPS daily.      cholecalciferol (VITAMIN D) 1000 units tablet Take 1,000 Units by mouth daily.      ibuprofen (ADVIL) 600 MG tablet Take 1 tablet (600 mg total) by mouth every 8 (eight) hours as needed for moderate pain. 60 tablet 1   latanoprost (XALATAN) 0.005 % ophthalmic solution 1 drop at bedtime.     MULTIPLE VITAMIN PO Take by mouth daily.      Omega-3 Fatty Acids (FISH OIL) 1000 MG CAPS daily.      psyllium (METAMUCIL) 58.6 % packet Take 1 packet by mouth daily.     triamterene-hydrochlorothiazide (  MAXZIDE-25) 37.5-25 MG tablet TAKE 1 TABLET EVERY DAY 90 tablet 3   No current facility-administered medications for this visit.    PHYSICAL EXAMINATION:   BP (!) 150/76 (BP Location: Left Arm, Patient Position: Sitting)   Pulse 87   Temp (!) 97 F (36.1 C) (Tympanic)   Resp 18   Ht '5\' 7"'$  (1.702 m)   Wt 167 lb (75.8 kg)   SpO2 100%   BMI 26.16 kg/m   Filed Weights   08/17/22 1137  Weight: 167 lb (75.8 kg)    Physical Exam Vitals and nursing note reviewed.  HENT:     Head: Normocephalic and atraumatic.     Mouth/Throat:     Pharynx: Oropharynx is clear.  Eyes:     Extraocular Movements: Extraocular movements intact.     Pupils: Pupils are equal, round, and reactive to light.  Cardiovascular:     Rate and Rhythm: Normal rate and regular rhythm.  Pulmonary:     Comments: Decreased breath sounds bilaterally.  Abdominal:     Palpations: Abdomen is soft.  Musculoskeletal:        General: Normal range of motion.     Cervical back: Normal range of motion.  Skin:    General: Skin is warm.  Neurological:     General:  No focal deficit present.     Mental Status: She is alert and oriented to person, place, and time.  Psychiatric:        Behavior: Behavior normal.        Judgment: Judgment normal.        LABORATORY DATA:  I have reviewed the data as listed    Component Value Date/Time   NA 135 07/09/2022 0535   NA 138 05/16/2022 1109   NA 140 08/07/2012 1023   K 3.7 07/09/2022 0535   K 4.0 08/07/2012 1023   CL 105 07/09/2022 0535   CL 106 08/07/2012 1023   CO2 24 07/09/2022 0535   CO2 28 08/07/2012 1023   GLUCOSE 92 07/09/2022 0535   GLUCOSE 99 08/07/2012 1023   BUN 7 (L) 07/09/2022 0535   BUN 16 05/16/2022 1109   BUN 14 08/07/2012 1023   CREATININE 0.88 07/09/2022 0535   CREATININE 0.98 08/07/2012 1023   CALCIUM 8.6 (L) 07/09/2022 0535   CALCIUM 9.2 08/07/2012 1023   PROT 7.6 06/13/2022 1522   PROT 6.6 04/14/2022 0942   PROT 7.7 08/07/2012 1023   ALBUMIN 4.4 06/13/2022 1522   ALBUMIN 4.3 04/14/2022 0942   ALBUMIN 4.1 08/07/2012 1023   AST 33 06/13/2022 1522   AST 21 08/07/2012 1023   ALT 39 06/13/2022 1522   ALT 30 08/07/2012 1023   ALKPHOS 51 06/13/2022 1522   ALKPHOS 63 08/07/2012 1023   BILITOT 0.8 06/13/2022 1522   BILITOT 0.3 04/14/2022 0942   BILITOT 0.5 08/07/2012 1023   GFRNONAA >60 07/09/2022 0535   GFRNONAA >60 08/07/2012 1023   GFRAA 64 04/10/2020 0933   GFRAA >60 08/07/2012 1023    No results found for: "SPEP", "UPEP"  Lab Results  Component Value Date   WBC 6.7 08/15/2022   NEUTROABS 3.4 08/15/2022   HGB 11.8 (L) 08/15/2022   HCT 36.9 08/15/2022   MCV 83.5 08/15/2022   PLT 282 08/15/2022      Chemistry      Component Value Date/Time   NA 135 07/09/2022 0535   NA 138 05/16/2022 1109   NA 140 08/07/2012 1023   K 3.7 07/09/2022 0535  K 4.0 08/07/2012 1023   CL 105 07/09/2022 0535   CL 106 08/07/2012 1023   CO2 24 07/09/2022 0535   CO2 28 08/07/2012 1023   BUN 7 (L) 07/09/2022 0535   BUN 16 05/16/2022 1109   BUN 14 08/07/2012 1023    CREATININE 0.88 07/09/2022 0535   CREATININE 0.98 08/07/2012 1023   GLU 91 11/13/2014 0000      Component Value Date/Time   CALCIUM 8.6 (L) 07/09/2022 0535   CALCIUM 9.2 08/07/2012 1023   ALKPHOS 51 06/13/2022 1522   ALKPHOS 63 08/07/2012 1023   AST 33 06/13/2022 1522   AST 21 08/07/2012 1023   ALT 39 06/13/2022 1522   ALT 30 08/07/2012 1023   BILITOT 0.8 06/13/2022 1522   BILITOT 0.3 04/14/2022 0942   BILITOT 0.5 08/07/2012 1023       RADIOGRAPHIC STUDIES: I have personally reviewed the radiological images as listed and agreed with the findings in the report. No results found.   ASSESSMENT & PLAN:  Cancer of right colon ([place]) Right-sided colon cancer -stage II: 2pT3N0- [low risk]-moderately differentiated; more than 30 lymph nodes; no LVI; or perineural invasion; no perforation noted. NOV prior to surgery- CEA- 4.3  # Discussed with the patient the rationale for adjuvant chemotherapy for stage II colon cancer is unclear.  Tumor MSI stable.    # Discussed regarding use of CT DNA/[employer] testing to better understand the risk of recurrence.  If found high risk based on CT DNA-chemotherapy will be offered.  However if considered low risk, would recommend surveillance without any adjuvant chemotherapy.  Discussed regarding a colonoscopy within a year.  # Anemia likely secondary to chronic GI bleed from #1.  Today hemoglobin is 11.2.  Should improve over time.   # Re: Fatty liver:  Discussed importance of healthy weight/and weight loss.  Strongly recommend eating more green leafy vegetables and cutting down processed food/ carbohydrates.  Instead increasing whole grains / protein in the diet.  Multiple studies have shown that optimal weight would help improve cardiovascular risk; also shown to cut on the risk of malignancies-colon cancer, breast cancer ovarian/uterine cancer in women and also prostate cancer in men.   # #Incidental findings on Imaging  CT , 2024: Gallstones;  diverticulosis I reviewed/discussed/counseled the patient.   # DISPOSITION: # [facility]a testing # follow up in 1 year- labs- cbc/cmp;cea- Dr.B  Thank you Dr.Pabone for allowing me to participate in the care of your pleasant patient. Please do not hesitate to contact me with questions or concerns in the interim.  # I reviewed the blood work- with the patient in detail; also reviewed the imaging independently [as summarized above]; and with the patient in detail.     Orders Placed This Encounter  Procedures   CBC with Differential    Standing Status:   Future    Standing Expiration Date:   08/17/2023   Comprehensive metabolic panel    Standing Status:   Future    Standing Expiration Date:   08/17/2023   CEA    Standing Status:   Future    Standing Expiration Date:   08/17/2023   All questions were answered. The patient knows to call the clinic with any problems, questions or concerns.      Cammie Sickle, MD 08/17/2022 2:36 PM

## 2022-08-17 NOTE — Progress Notes (Signed)
In Oct pt had sudden low hgb of 6. Had 3 units of blood in hospital. Had a colonoscopy and a colon mass was found. Had colectomy in Dec removed 12 inches of colon. 32 lymph removed all were negative for cancer. Hgb is now 8.8. Pt feels well. Appetite is good. She has progressed to a regular diet. No nausea. No pain. Stools are normal. Labs drawn at visit today for signatera testing.

## 2022-08-17 NOTE — Telephone Encounter (Signed)
Initiated [employer]. Labs drawn by RN. Kit completed and ready for UPS pick up. Request for pathology faxed.

## 2022-08-19 ENCOUNTER — Other Ambulatory Visit: Payer: Self-pay

## 2022-08-19 DIAGNOSIS — C182 Malignant neoplasm of ascending colon: Secondary | ICD-10-CM

## 2022-08-19 LAB — GENETIC SCREENING ORDER

## 2022-08-23 ENCOUNTER — Ambulatory Visit: Payer: Medicare HMO | Admitting: Family Medicine

## 2022-09-06 ENCOUNTER — Encounter: Payer: Self-pay | Admitting: Internal Medicine

## 2022-09-06 LAB — SIGNATERA
SIGNATERA MTM READOUT: 0 MTM/ml
SIGNATERA TEST RESULT: NEGATIVE

## 2022-09-16 ENCOUNTER — Inpatient Hospital Stay: Payer: Medicare HMO | Attending: Internal Medicine

## 2022-09-16 DIAGNOSIS — C182 Malignant neoplasm of ascending colon: Secondary | ICD-10-CM | POA: Diagnosis not present

## 2022-09-21 LAB — SIGNATERA
SIGNATERA MTM READOUT: 0 MTM/ml
SIGNATERA TEST RESULT: NEGATIVE

## 2022-09-22 ENCOUNTER — Encounter: Payer: Self-pay | Admitting: Internal Medicine

## 2022-10-17 ENCOUNTER — Ambulatory Visit: Payer: Medicare HMO | Admitting: Surgery

## 2022-10-17 ENCOUNTER — Encounter: Payer: Self-pay | Admitting: Surgery

## 2022-10-17 VITALS — BP 164/80 | HR 93 | Temp 98.7°F | Ht 66.0 in | Wt 169.6 lb

## 2022-10-17 DIAGNOSIS — Z08 Encounter for follow-up examination after completed treatment for malignant neoplasm: Secondary | ICD-10-CM

## 2022-10-17 DIAGNOSIS — C182 Malignant neoplasm of ascending colon: Secondary | ICD-10-CM | POA: Diagnosis not present

## 2022-10-17 DIAGNOSIS — Z1211 Encounter for screening for malignant neoplasm of colon: Secondary | ICD-10-CM

## 2022-10-17 DIAGNOSIS — Z09 Encounter for follow-up examination after completed treatment for conditions other than malignant neoplasm: Secondary | ICD-10-CM

## 2022-10-17 NOTE — Patient Instructions (Addendum)
Please follow up with a colonoscopy after 1 year. A referral has been placed to [facility] (Dr. Allen Norris). They will reach out to your for an appointment.    If you have any concerns or questions, please feel free to call our office. Follow up as needed.   Minimally Invasive Partial Colectomy, Adult, Care After The following information offers guidance on how to care for yourself after your procedure. Your health care provider may also give you more specific instructions. If you have problems or questions, contact your health care provider. What can I expect after the surgery? After the procedure, it is common to have: Pain, bruising, and swelling. Bloating. Weakness and tiredness (fatigue). Changes to your bowel movements, especially having bowel movements more often. Follow these instructions at home: Medicines Take over-the-counter and prescription medicines only as told by your health care provider. If you were prescribed an antibiotic medicine, take it as told by your health care provider. Do not stop using the antibiotic even if you start to feel better. Ask your health care provider if the medicine prescribed to you: Requires you to avoid driving or using machinery. Can cause constipation. You may need to take these actions to prevent or treat constipation: Drink enough fluids to keep your urine pale yellow. Take over-the-counter or prescription medicines. Limit foods that are high in fat and processed sugars, such as fried or sweet foods. Eating and drinking Follow instructions from your health care provider about what you may eat and drink. Do not drink alcohol if your health care provider tells you not to drink. Eat a low-fiber diet for the first 4 weeks after surgery or as told by your health care provider.. Most people on a low-fiber eating plan should eat less than 10 grams (g) of fiber a day. Follow recommendations from your health care provider or dietitian about how much fiber  you should have each day. Always check food labels to know the fiber content of packaged foods. In general, a low-fiber food will have fewer than 2 g of fiber per serving. In general, try to avoid whole grains, raw fruits and vegetables, dried fruit, tough cuts of meat, nuts, and seeds. Incision care  Follow instructions from your health care provider about how to take care of your incisions. Make sure you: Wash your hands with soap and water for at least 20 seconds before and after you change your bandage (dressing). If soap and water are not available, use hand sanitizer. Change your dressing as told by your health care provider. Leave stitches (sutures), skin glue, or adhesive strips in place. These skin closures may need to stay in place for 2 weeks or longer. If adhesive strip edges start to loosen and curl up, you may trim the loose edges. Do not remove adhesive strips completely unless your health care provider tells you to do that. Check your incision area every day for signs of infection. Check for: More redness, swelling, or pain. Fluid or blood. Warmth. Pus or a bad smell. Activity Rest as told by your health care provider. Avoid sitting for a long time without moving. Get up to take short walks every 1-2 hours. This is important to improve blood flow and breathing. Ask for help if you feel weak or unsteady. You may have to avoid lifting. Ask your health care provider how much you can safely lift. Return to your normal activities as told by your health care provider. Ask your health care provider what activities are safe  for you. General instructions Do not use any products that contain nicotine or tobacco. These products include cigarettes, chewing tobacco, and vaping devices, such as e-cigarettes. If you need help quitting, ask your health care provider. Do not take baths, swim, or use a hot tub until your health care provider approves. Ask your health care provider if you may take  showers. You may only be allowed to take sponge baths. Wear compression stockings as told by your health care provider. These stockings help to prevent blood clots and reduce swelling in your legs. Keep all follow-up visits. This is important to monitor healing and check for any complications. Contact a health care provider if: Medicine is not controlling your pain. You have chills or fever. You have any signs of infection in your incision areas. You have a persistent cough. You have nausea or vomiting. You develop a rash. You have not had a bowel movement in 3 days. Get help right away if: You have severe pain. Your incisions break open after sutures or staples have been removed. You are bleeding from your rectum or have blood in your stool. You have a warm, tender swelling in your leg. You have chest pain or trouble breathing. You have increased swelling in the abdomen. You feel light-headed or you faint. These symptoms may be an emergency. Get help right away. Call 911. Do not wait to see if the symptoms will go away. Do not drive yourself to the hospital. Summary After surgery, it is common to have some pain, bruising, swelling, bloating, tiredness, weakness, or changes to your bowel movements. Follow instructions from your health care provider about what to eat and drink. Return to your normal activities as told by your health care provider. Check your incision area every day for signs of infection. Get help right away if you have chest pain or trouble breathing. This information is not intended to replace advice given to you by your health care provider. Make sure you discuss any questions you have with your health care provider. Document Revised: 10/27/2021 Document Reviewed: 10/27/2021 Elsevier Patient Education  [place].

## 2022-10-18 DIAGNOSIS — H43813 Vitreous degeneration, bilateral: Secondary | ICD-10-CM | POA: Diagnosis not present

## 2022-10-18 NOTE — Progress Notes (Signed)
Outpatient Surgical Follow Up  10/18/2022  Cindy Vega is an 77 y.o. female.   Chief Complaint  Patient presents with   Follow-up    Right colectomy 07/05/22    HPI: Cindy Vega is 3 months out from right colectomy for colon cancer.  No evidence of metastatic lymphadenopathy.  She is doing very well she is back to normal and better than preoperatively.  Her anemia has resolved.  She is tolerating diet.  No fevers no chills normal bowel movements.  Past Medical History:  Diagnosis Date   Anemia    Anxiety    Arthritis    fingers   Cancer ([place])    basal cell, COLON   Hypertension    PONV (postoperative nausea and vomiting)    after hysterectomy   Pre-diabetes    Sleep apnea    CPAP    Past Surgical History:  Procedure Laterality Date   ABDOMINAL HYSTERECTOMY  1996   PARTIAL   BREAST CYST EXCISION Right 1976   cyst removed-neg   CATARACT EXTRACTION Bilateral    COLONOSCOPY WITH PROPOFOL N/A 03/02/2017   Procedure: COLONOSCOPY WITH PROPOFOL;  Surgeon: Lucilla Lame, MD;  Location: [place];  Service: Endoscopy;  Laterality: N/A;   COLONOSCOPY WITH PROPOFOL N/A 06/13/2022   Procedure: COLONOSCOPY WITH PROPOFOL WITH BIOPSY;  Surgeon: Lucilla Lame, MD;  Location: [place];  Service: Endoscopy;  Laterality: N/A;  SPOT USED TO MARK ASCENDING COLON POLYP   ESOPHAGOGASTRODUODENOSCOPY (EGD) WITH PROPOFOL N/A 06/13/2022   Procedure: ESOPHAGOGASTRODUODENOSCOPY (EGD) WITH PROPOFOL;  Surgeon: Lucilla Lame, MD;  Location: [place];  Service: Endoscopy;  Laterality: N/A;  Sleep apnea   LAPAROSCOPIC RIGHT COLECTOMY N/A 07/05/2022   Procedure: LAPAROSCOPIC RIGHT COLECTOMY hand assisted, RNFA to assist;  Surgeon: Jules Husbands, MD;  Location: [facility];  Service: General;  Laterality: N/A;   POLYPECTOMY N/A 03/02/2017   Procedure: POLYPECTOMY;  Surgeon: Lucilla Lame, MD;  Location: [place];  Service: Endoscopy;  Laterality: N/A;    Family History   Problem Relation Age of Onset   Hypertension Mother    Heart disease Mother    Diabetes Mother    Diabetes Father    Prostate cancer Father    Breast cancer Sister 48   Graves' disease Sister    Breast cancer Sister     Social History:  reports that she has never smoked. She has never used smokeless tobacco. She reports current alcohol use of about 3.0 standard drinks of alcohol per week. She reports that she does not use drugs.  Allergies:  Allergies  Allergen Reactions   Silver Nitrate     LIQUID NITRATE used to remove warts    Medications reviewed.    ROS Full ROS performed and is otherwise negative other than what is stated in HPI   BP (!) 164/80   Pulse 93   Temp 98.7 F (37.1 C) (Oral)   Ht 5\' 6"  (1.676 m)   Wt 169 lb 9.6 oz (76.9 kg)   SpO2 96%   BMI 27.37 kg/m   Physical Exam Vitals and nursing note reviewed. Exam conducted with a chaperone present.  Constitutional:      Appearance: Normal appearance.  Eyes:     General:        Right eye: No discharge.        Left eye: No discharge.  Cardiovascular:     Rate and Rhythm: Normal rate and regular rhythm.     Heart sounds:  No murmur heard.    No friction rub.  Pulmonary:     Effort: Pulmonary effort is normal. No respiratory distress.     Breath sounds: Normal breath sounds. No stridor.  Abdominal:     General: Abdomen is flat. There is no distension.     Palpations: Abdomen is soft. There is no mass.     Tenderness: There is no abdominal tenderness. There is no guarding or rebound.     Hernia: No hernia is present.  Musculoskeletal:     Cervical back: Normal range of motion and neck supple. No rigidity or tenderness.  Skin:    General: Skin is warm.     Capillary Refill: Capillary refill takes less than 2 seconds.  Neurological:     General: No focal deficit present.     Mental Status: She is alert and oriented to person, place, and time.  Psychiatric:        Mood and Affect: Mood normal.         Behavior: Behavior normal.        Thought Content: Thought content normal.        Judgment: Judgment normal.    Assessment/Plan: Status post right colectomy laparoscopically doing very well no complications.  She is very [employer] attorney.  We will arrange for colonoscopy at 1 year mark.  I will be happy to see her in 8 months or so. I spent 20 minutes in this encounter including personally reviewing imaging studies, records, coordinating her care, placing orders and performing appropriate documentation.      Caroleen Hamman, MD [facility] General Surgeon

## 2022-10-19 ENCOUNTER — Telehealth: Payer: Self-pay

## 2022-10-19 ENCOUNTER — Other Ambulatory Visit: Payer: Self-pay

## 2022-10-19 DIAGNOSIS — Z85038 Personal history of other malignant neoplasm of large intestine: Secondary | ICD-10-CM

## 2022-10-19 NOTE — Telephone Encounter (Signed)
Patient returned phone call.  She said it will be fine to have her colonoscopy at [facility] on 06/15/23 instead of [place].  Thanks,  [place], [place]

## 2022-10-19 NOTE — Telephone Encounter (Signed)
Gastroenterology Pre-Procedure Review  Request Date: 06/15/23 Requesting Physician: Dr. Allen Norris  PATIENT REVIEW QUESTIONS: The patient responded to the following health history questions as indicated:    1. Are you having any GI issues? no 2. Do you have a personal history of Polyps? Personal history of colon cancer 06/2022 3. Do you have a family history of Colon Cancer or Polyps? no 4. Diabetes Mellitus? no 5. Joint replacements in the past 12 months?no 6. Major health problems in the past 3 months?no 7. Any artificial heart valves, MVP, or defibrillator?no    MEDICATIONS & ALLERGIES:    Patient reports the following regarding taking any anticoagulation/antiplatelet therapy:   Plavix, Coumadin, Eliquis, Xarelto, Lovenox, Pradaxa, Brilinta, or Effient? no Aspirin? yes (81mg  daily)  Patient confirms/reports the following medications:  Current Outpatient Medications  Medication Sig Dispense Refill   aspirin 81 MG tablet Take by mouth every other day.      atorvastatin (LIPITOR) 20 MG tablet TAKE 1 TABLET EVERY DAY 90 tablet 3   Biotin 5 MG CAPS daily.      cholecalciferol (VITAMIN D) 1000 units tablet Take 1,000 Units by mouth daily.      latanoprost (XALATAN) 0.005 % ophthalmic solution 1 drop at bedtime.     MULTIPLE VITAMIN PO Take by mouth daily.      Omega-3 Fatty Acids (FISH OIL) 1000 MG CAPS daily.      psyllium (METAMUCIL) 58.6 % packet Take 1 packet by mouth daily.     triamterene-hydrochlorothiazide (MAXZIDE-25) 37.5-25 MG tablet TAKE 1 TABLET EVERY DAY 90 tablet 3   No current facility-administered medications for this visit.    Patient confirms/reports the following allergies:  Allergies  Allergen Reactions   Silver Nitrate     LIQUID NITRATE used to remove warts    No orders of the defined types were placed in this encounter.   AUTHORIZATION INFORMATION Primary Insurance: 1D#: Group #:  Secondary Insurance: 1D#: Group #:  SCHEDULE INFORMATION: Date:  06/15/23 Time: Location: [place]

## 2022-10-19 NOTE — Telephone Encounter (Signed)
Left voice message for patient to let her know that Dr. Allen Norris is at [facility] on 06/15/23 not [place] as I misinformed her when I scheduled this morning.  Asked her to call me back to let me know if she is okay with having her colonoscopy at [facility] instead of [place].  Thanks, [place], [place]

## 2022-10-26 ENCOUNTER — Other Ambulatory Visit: Payer: Self-pay | Admitting: Family Medicine

## 2022-10-27 ENCOUNTER — Telehealth: Payer: Self-pay

## 2022-10-27 NOTE — Telephone Encounter (Signed)
Requested medication (s) are due for refill today:   No    Requested medication (s) are on the active medication list:   No  Future visit scheduled:   Yes   Last ordered: Not listed on her medication list or no mention of depression/Prozac in last office note from Tally Joe,  Returned because this is not on her medication list.      Requested Prescriptions  Pending Prescriptions Disp Refills   FLUoxetine (PROZAC) 10 MG capsule [Pharmacy Med Name: FLUOXETINE HYDROCHLORIDE 10 MG Capsule] 90 capsule 3    Sig: TAKE [place]     Psychiatry:  Antidepressants - SSRI Passed - 10/26/2022  5:53 PM      Passed - Valid encounter within last 6 months    Recent Outpatient Visits           2 months ago Severe sleep apnea   [facility] Tally Joe T, FNP   4 months ago Iron deficiency anemia due to chronic blood loss   [facility] Tally Joe T, FNP   5 months ago Acute gout involving toe of left foot, unspecified cause   [facility] Myles Gip, DO   6 months ago Annual physical exam   [facility] Gwyneth Sprout, [place]   1 year ago Visit for preventive health examination   [facility] Gwyneth Sprout, FNP       Future Appointments             In 5 months Gwyneth Sprout, [place], [place]

## 2022-10-27 NOTE — Telephone Encounter (Signed)
Pt lft message stating that she had received the wrong info for her procedure she would like a call back

## 2022-11-14 ENCOUNTER — Telehealth: Payer: Self-pay | Admitting: *Deleted

## 2022-11-14 ENCOUNTER — Other Ambulatory Visit: Payer: Self-pay | Admitting: *Deleted

## 2022-11-14 NOTE — Telephone Encounter (Signed)
Patient called stating that Dr B had ordered and spoken to her about Signatera labs. Side effects states that she has been sent two more vials in the mail and is asking if she is supposed to get blood drawn. Please advise

## 2022-11-14 NOTE — Telephone Encounter (Signed)
Called pt. Scheduled signatera for next Monday.

## 2022-11-21 ENCOUNTER — Inpatient Hospital Stay: Payer: Medicare HMO | Attending: Internal Medicine

## 2022-11-21 DIAGNOSIS — C182 Malignant neoplasm of ascending colon: Secondary | ICD-10-CM | POA: Diagnosis not present

## 2022-11-28 LAB — SIGNATERA
SIGNATERA MTM READOUT: 0 MTM/ml
SIGNATERA TEST RESULT: NEGATIVE

## 2022-11-29 ENCOUNTER — Encounter: Payer: Self-pay | Admitting: Internal Medicine

## 2023-01-05 ENCOUNTER — Other Ambulatory Visit: Payer: Self-pay | Admitting: Family Medicine

## 2023-01-05 DIAGNOSIS — E7800 Pure hypercholesterolemia, unspecified: Secondary | ICD-10-CM

## 2023-01-05 NOTE — Telephone Encounter (Signed)
Requested medication (s) are due for refill today: expired medication  Requested medication (s) are on the active medication list: yes   Last refill:  12/28/21 #90 3 refills   Future visit scheduled: yes in 3 months   Notes to clinic:  expired medication. Do you want to renew Rx?     Requested Prescriptions  Pending Prescriptions Disp Refills   atorvastatin (LIPITOR) 20 MG tablet [Pharmacy Med Name: ATORVASTATIN CALCIUM 20 MG Tablet] 90 tablet 3    Sig: TAKE 1 TABLET EVERY DAY     Cardiovascular:  Antilipid - Statins Failed - 01/05/2023  4:02 AM      Failed - Lipid Panel in normal range within the last 12 months    Cholesterol, Total  Date Value Ref Range Status  04/14/2022 165 100 - 199 mg/dL Final   Cholesterol  Date Value Ref Range Status  08/07/2012 223 (H) 0 - 200 mg/dL Final   Ldl Cholesterol, Calc  Date Value Ref Range Status  08/07/2012 115 (H) 0 - 100 mg/dL Final   LDL Chol Calc (NIH)  Date Value Ref Range Status  04/14/2022 70 0 - 99 mg/dL Final   HDL Cholesterol  Date Value Ref Range Status  08/07/2012 79 (H) 40 - 60 mg/dL Final   HDL  Date Value Ref Range Status  04/14/2022 74 >39 mg/dL Final   Triglycerides  Date Value Ref Range Status  04/14/2022 121 0 - 149 mg/dL Final  16/04/9603 540 0 - 200 mg/dL Final         Passed - Patient is not pregnant      Passed - Valid encounter within last 12 months    Recent Outpatient Visits           5 months ago Severe sleep apnea   [facility] Merita Norton T, FNP   7 months ago Iron deficiency anemia due to chronic blood loss   [facility] Merita Norton T, FNP   7 months ago Acute gout involving toe of left foot, unspecified cause   [facility] Caro Laroche, DO   8 months ago Annual physical exam   [facility] Jacky Kindle, FNP   1 year ago Visit for preventive health examination   [facility] Jacky Kindle, FNP       Future Appointments             In 3 months Jacky Kindle, FNP [facility]

## 2023-01-24 ENCOUNTER — Encounter: Payer: Self-pay | Admitting: *Deleted

## 2023-02-17 ENCOUNTER — Other Ambulatory Visit: Payer: Medicare HMO

## 2023-02-20 ENCOUNTER — Inpatient Hospital Stay: Payer: Medicare HMO | Attending: Internal Medicine

## 2023-02-20 ENCOUNTER — Other Ambulatory Visit: Payer: Medicare HMO

## 2023-02-20 DIAGNOSIS — C182 Malignant neoplasm of ascending colon: Secondary | ICD-10-CM | POA: Diagnosis not present

## 2023-02-27 ENCOUNTER — Encounter: Payer: Self-pay | Admitting: Internal Medicine

## 2023-03-29 ENCOUNTER — Other Ambulatory Visit: Payer: Self-pay | Admitting: Family Medicine

## 2023-03-29 DIAGNOSIS — Z1231 Encounter for screening mammogram for malignant neoplasm of breast: Secondary | ICD-10-CM

## 2023-04-13 ENCOUNTER — Ambulatory Visit (INDEPENDENT_AMBULATORY_CARE_PROVIDER_SITE_OTHER): Payer: Medicare HMO | Admitting: Family Medicine

## 2023-04-13 ENCOUNTER — Encounter: Payer: Self-pay | Admitting: Family Medicine

## 2023-04-13 VITALS — BP 158/81 | HR 73 | Ht 66.0 in | Wt 175.6 lb

## 2023-04-13 DIAGNOSIS — D619 Aplastic anemia, unspecified: Secondary | ICD-10-CM

## 2023-04-13 DIAGNOSIS — Z0000 Encounter for general adult medical examination without abnormal findings: Secondary | ICD-10-CM | POA: Diagnosis not present

## 2023-04-13 DIAGNOSIS — I10 Essential (primary) hypertension: Secondary | ICD-10-CM | POA: Diagnosis not present

## 2023-04-13 DIAGNOSIS — R7303 Prediabetes: Secondary | ICD-10-CM | POA: Diagnosis not present

## 2023-04-13 DIAGNOSIS — D500 Iron deficiency anemia secondary to blood loss (chronic): Secondary | ICD-10-CM

## 2023-04-13 MED ORDER — LOSARTAN POTASSIUM-HCTZ 100-12.5 MG PO TABS
1.0000 | ORAL_TABLET | Freq: Every day | ORAL | 3 refills | Status: DC
Start: 2023-04-13 — End: 2024-01-31

## 2023-04-13 NOTE — Progress Notes (Signed)
Complete physical exam  Patient: Cindy Vega   DOB: 1946-03-28   76 y.o. Female  MRN: [id number] Visit Date: 04/13/2023  Today's healthcare provider: Jacky Kindle, FNP  Re-introduced to nurse practitioner role and practice setting.  All questions answered.  Discussed provider/patient relationship and expectations.  Chief Complaint  Patient presents with   Annual Exam   Subjective    Cindy Vega is a 77 y.o. female who presents today for a complete physical exam.  She reports consuming a general diet. The patient does not participate in regular exercise at present. She generally feels fairly well. She reports sleeping fairly well. She does have additional problems to discuss today.  HPI   Past Medical History:  Diagnosis Date   Anemia    Anxiety    Arthritis    fingers   Cancer (HCC)    basal cell, COLON   Hypertension    PONV (postoperative nausea and vomiting)    after hysterectomy   Pre-diabetes    Sleep apnea    CPAP   Past Surgical History:  Procedure Laterality Date   ABDOMINAL HYSTERECTOMY  1996   PARTIAL   BREAST CYST EXCISION Right 1976   cyst removed-neg   CATARACT EXTRACTION Bilateral    COLONOSCOPY WITH PROPOFOL N/A 03/02/2017   Procedure: COLONOSCOPY WITH PROPOFOL;  Surgeon: Midge Minium, MD;  Location: [facility];  Service: Endoscopy;  Laterality: N/A;   COLONOSCOPY WITH PROPOFOL N/A 06/13/2022   Procedure: COLONOSCOPY WITH PROPOFOL WITH BIOPSY;  Surgeon: Midge Minium, MD;  Location: [facility];  Service: Endoscopy;  Laterality: N/A;  SPOT USED TO MARK ASCENDING COLON POLYP   ESOPHAGOGASTRODUODENOSCOPY (EGD) WITH PROPOFOL N/A 06/13/2022   Procedure: ESOPHAGOGASTRODUODENOSCOPY (EGD) WITH PROPOFOL;  Surgeon: Midge Minium, MD;  Location: [facility];  Service: Endoscopy;  Laterality: N/A;  Sleep apnea   LAPAROSCOPIC RIGHT COLECTOMY N/A 07/05/2022   Procedure: LAPAROSCOPIC RIGHT COLECTOMY hand assisted, RNFA to  assist;  Surgeon: Leafy Ro, MD;  Location: [facility];  Service: General;  Laterality: N/A;   POLYPECTOMY N/A 03/02/2017   Procedure: POLYPECTOMY;  Surgeon: Midge Minium, MD;  Location: [facility];  Service: Endoscopy;  Laterality: N/A;   Social History   Socioeconomic History   Marital status: Married    Spouse name: [name]   Number of children: 2   Years of education: Not on file   Highest education level: Bachelor's degree (e.g., BA, AB, BS)  Occupational History   Occupation: retired  Tobacco Use   Smoking status: Never   Smokeless tobacco: Never  Vaping Use   Vaping status: Never Used  Substance and Sexual Activity   Alcohol use: Yes    Alcohol/week: 3.0 standard drinks of alcohol    Types: 3 Glasses of wine per week   Drug use: No   Sexual activity: Not on file  Other Topics Concern   Not on file  Social History Narrative   Not on file   Social Determinants of Health   Financial Resource Strain: Low Risk  (08/17/2022)   Overall Financial Resource Strain (CARDIA)    Difficulty of Paying Living Expenses: Not hard at all  Food Insecurity: No Food Insecurity (08/17/2022)   Hunger Vital Sign    Worried About Running Out of Food in the Last Year: Never true    Ran Out of Food in the Last Year: Never true  Transportation Needs: No Transportation Needs (08/17/2022)   PRAPARE - Transportation  Lack of Transportation (Medical): No    Lack of Transportation (Non-Medical): No  Physical Activity: Insufficiently Active (04/26/2022)   Exercise Vital Sign    Days of Exercise per Week: 2 days    Minutes of Exercise per Session: 60 min  Stress: No Stress Concern Present (04/26/2022)   [employer] of Occupational Health - Occupational Stress Questionnaire    Feeling of Stress : Not at all  Social Connections: Moderately Integrated (04/26/2022)   Social Connection and Isolation Panel [NHANES]    Frequency of Communication with Friends and Family: More than three  times a week    Frequency of Social Gatherings with Friends and Family: More than three times a week    Attends Religious Services: More than 4 times per year    Active Member of [employer] or Organizations: No    Attends Banker Meetings: Never    Marital Status: Married  Catering manager Violence: Not At Risk (08/17/2022)   Humiliation, Afraid, Rape, and Kick questionnaire    Fear of Current or Ex-Partner: No    Emotionally Abused: No    Physically Abused: No    Sexually Abused: No   Family Status  Relation Name Status   Mother  Alive   Father  Deceased at age 63   Sister  Deceased   Sister  Alive   Sister  Alive   Daughter  Alive   Brother  Alive   Son  Alive  No partnership data on file   Family History  Problem Relation Age of Onset   Hypertension Mother    Heart disease Mother    Diabetes Mother    Diabetes Father    Prostate cancer Father    Breast cancer Sister 34   Graves' disease Sister    Breast cancer Sister    Allergies  Allergen Reactions   Silver Nitrate     LIQUID NITRATE used to remove warts    Patient Care Team: Jacky Kindle, FNP as PCP - General (Family Medicine) [place] Crane, MD as Consulting Physician (Ophthalmology) Debbrah Alar, MD (Dermatology) Benita Gutter, RN as Oncology Nurse Navigator   Medications: Outpatient Medications Prior to Visit  Medication Sig   aspirin 81 MG tablet Take by mouth every other day.    atorvastatin (LIPITOR) 20 MG tablet TAKE 1 TABLET EVERY DAY   Biotin 5 MG CAPS daily.    cholecalciferol (VITAMIN D) 1000 units tablet Take 1,000 Units by mouth daily.    Cholecalciferol (VITAMIN D3) 50 MCG (2000 UT) capsule Take 2,000 Units by mouth daily.   latanoprost (XALATAN) 0.005 % ophthalmic solution 1 drop at bedtime.   MULTIPLE VITAMIN PO Take by mouth daily.    Omega-3 Fatty Acids (FISH OIL) 1000 MG CAPS daily.    psyllium (METAMUCIL) 58.6 % packet Take 1 packet by mouth daily.    [DISCONTINUED] triamterene-hydrochlorothiazide (MAXZIDE-25) 37.5-25 MG tablet TAKE 1 TABLET EVERY DAY   [DISCONTINUED] FLUoxetine (PROZAC) 10 MG capsule TAKE 1 CAPSULE EVERY DAY (Patient not taking: Reported on 04/13/2023)   No facility-administered medications prior to visit.    Review of Systems    Objective    BP (!) 158/81 (BP Location: Right Arm, Patient Position: Sitting, Cuff Size: Normal)   Pulse 73   Ht 5\' 6"  (1.676 m)   Wt 175 lb 9.6 oz (79.7 kg)   SpO2 99%   BMI 28.34 kg/m    Physical Exam Vitals and nursing note reviewed.  Constitutional:  General: She is awake. She is not in acute distress.    Appearance: Normal appearance. She is well-developed, well-groomed and overweight. She is not ill-appearing, toxic-appearing or diaphoretic.  HENT:     Head: Normocephalic and atraumatic.     Jaw: There is normal jaw occlusion. No trismus, tenderness, swelling or pain on movement.     Right Ear: Hearing, tympanic membrane, ear canal and external ear normal. There is no impacted cerumen.     Left Ear: Hearing, tympanic membrane, ear canal and external ear normal. There is no impacted cerumen.     Nose: Nose normal. No congestion or rhinorrhea.     Right Turbinates: Not enlarged, swollen or pale.     Left Turbinates: Not enlarged, swollen or pale.     Right Sinus: No maxillary sinus tenderness or frontal sinus tenderness.     Left Sinus: No maxillary sinus tenderness or frontal sinus tenderness.     Mouth/Throat:     Lips: Pink.     Mouth: Mucous membranes are moist. No injury.     Tongue: No lesions.     Pharynx: Oropharynx is clear. Uvula midline. No pharyngeal swelling, oropharyngeal exudate, posterior oropharyngeal erythema or uvula swelling.     Tonsils: No tonsillar exudate or tonsillar abscesses.  Eyes:     General: Lids are normal. Lids are everted, no foreign bodies appreciated. Vision grossly intact. Gaze aligned appropriately. No allergic shiner or visual field  deficit.       Right eye: No discharge.        Left eye: No discharge.     Extraocular Movements: Extraocular movements intact.     Conjunctiva/sclera: Conjunctivae normal.     Right eye: Right conjunctiva is not injected. No exudate.    Left eye: Left conjunctiva is not injected. No exudate.    Pupils: Pupils are equal, round, and reactive to light.  Neck:     Thyroid: No thyroid mass, thyromegaly or thyroid tenderness.     Vascular: No carotid bruit.     Trachea: Trachea normal.      Comments: Cyst on R neck; non painful Cardiovascular:     Rate and Rhythm: Normal rate and regular rhythm.     Pulses: Normal pulses.          Carotid pulses are 2+ on the right side and 2+ on the left side.      Radial pulses are 2+ on the right side and 2+ on the left side.       Dorsalis pedis pulses are 2+ on the right side and 2+ on the left side.       Posterior tibial pulses are 2+ on the right side and 2+ on the left side.     Heart sounds: Normal heart sounds, S1 normal and S2 normal. No murmur heard.    No friction rub. No gallop.  Pulmonary:     Effort: Pulmonary effort is normal. No respiratory distress.     Breath sounds: Normal breath sounds and air entry. No stridor. No wheezing, rhonchi or rales.  Chest:     Chest wall: No tenderness.  Abdominal:     General: Abdomen is flat. Bowel sounds are normal. There is no distension.     Palpations: Abdomen is soft. There is no mass.     Tenderness: There is no abdominal tenderness. There is no right CVA tenderness, left CVA tenderness, guarding or rebound.     Hernia: No hernia is present.  Genitourinary:  Comments: Exam deferred; denies complaints Musculoskeletal:        General: No swelling, tenderness, deformity or signs of injury. Normal range of motion.     Cervical back: Full passive range of motion without pain, normal range of motion and neck supple. No edema, rigidity or tenderness. No muscular tenderness.     Right lower leg: No  edema.     Left lower leg: No edema.  Lymphadenopathy:     Cervical: No cervical adenopathy.     Right cervical: No superficial, deep or posterior cervical adenopathy.    Left cervical: No superficial, deep or posterior cervical adenopathy.  Skin:    General: Skin is warm and dry.     Capillary Refill: Capillary refill takes less than 2 seconds.     Coloration: Skin is not jaundiced or pale.     Findings: No bruising, erythema, lesion or rash.  Neurological:     General: No focal deficit present.     Mental Status: She is alert and oriented to person, place, and time. Mental status is at baseline.     GCS: GCS eye subscore is 4. GCS verbal subscore is 5. GCS motor subscore is 6.     Sensory: Sensation is intact. No sensory deficit.     Motor: Motor function is intact. No weakness.     Coordination: Coordination is intact. Coordination normal.     Gait: Gait is intact. Gait normal.  Psychiatric:        Attention and Perception: Attention and perception normal.        Mood and Affect: Mood and affect normal.        Speech: Speech normal.        Behavior: Behavior normal. Behavior is cooperative.        Thought Content: Thought content normal.        Cognition and Memory: Cognition and memory normal.        Judgment: Judgment normal.     Last depression screening scores    08/17/2022   11:54 AM 08/04/2022    8:57 AM 06/01/2022   11:16 AM  PHQ 2/9 Scores  PHQ - 2 Score 0 0 0  PHQ- 9 Score 0 0 3   Last fall risk screening    08/15/2022   10:22 AM  Fall Risk   Falls in the past year? 0   Last Audit-C alcohol use screening    08/17/2022   11:54 AM  Alcohol Use Disorder Test (AUDIT)  1. How often do you have a drink containing alcohol? 2  2. How many drinks containing alcohol do you have on a typical day when you are drinking? 0  3. How often do you have six or more drinks on one occasion? 0  AUDIT-C Score 2   A score of 3 or more in women, and 4 or more in men indicates  increased risk for alcohol abuse, EXCEPT if all of the points are from question 1   No results found for any visits on 04/13/23.  Assessment & Plan    Routine Health Maintenance and Physical Exam  Exercise Activities and Dietary recommendations  Goals      DIET - EAT MORE FRUITS AND VEGETABLES     DIET - INCREASE WATER INTAKE     Recommend increasing water intake to 6-8 glasses a day.      Exercise 3x per week (30 min per time)     Recommend to start exercising 3 days  a week for at least 30 minutes at a time.        Immunization History  Administered Date(s) Administered   Fluad Quad(high Dose 65+) 03/31/2020, 04/11/2022   Influenza, High Dose Seasonal PF 05/13/2014, 04/01/2016, 05/03/2017   Influenza,inj,Quad PF,6+ Mos 05/08/2013, 04/17/2021   Influenza-Unspecified 05/26/2015, 04/17/2018, 04/17/2021   Moderna SARS-COV2 Booster Vaccination 06/25/2020   Moderna Sars-Covid-2 Vaccination 08/30/2019, 10/01/2019   Pneumococcal Conjugate-13 05/13/2014   Pneumococcal Polysaccharide-23 05/08/2013   Td 04/25/2003   Tdap 08/18/2010   Zoster Recombinant(Shingrix) 02/20/2018, 06/01/2018, 06/25/2018   Zoster, Live 06/02/2008    Health Maintenance  Topic Date Due   COVID-19 Vaccine (3 - Moderna risk series) 07/23/2020   DTaP/Tdap/Td (3 - Td or Tdap) 08/18/2020   Medicare Annual Wellness (AWV)  04/27/2023   INFLUENZA VACCINE  10/23/2023 (Originally 02/23/2023)   DEXA SCAN  04/19/2026   Colonoscopy  06/14/2027   Pneumonia Vaccine 11+ Years old  Completed   Hepatitis C Screening  Completed   Zoster Vaccines- Shingrix  Completed   HPV VACCINES  Aged Out    Discussed health benefits of physical activity, and encouraged her to engage in regular exercise appropriate for her age and condition.  Problem List Items Addressed This Visit       Cardiovascular and Mediastinum   Essential hypertension    Chronic, elevated Goal 130/80 Recommend change to losartan 100-25 from maxzide  25 Continue to monitor      Relevant Medications   losartan-hydrochlorothiazide (HYZAAR) 100-12.5 MG tablet     Hematopoietic and Hemostatic   Aplastic anemia (HCC)    Chronic, repeat labs to assist F/b hem      Relevant Orders   Iron, TIBC and Ferritin Panel     Other   Annual physical exam - Primary    Things to do to keep yourself healthy  - Exercise at least 30-45 minutes a day, 3-4 days a week.  - Eat a low-fat diet with lots of fruits and vegetables, up to 7-9 servings per day.  - Seatbelts can save your life. Wear them always.  - Smoke detectors on every level of your home, check batteries every year.  - Eye Doctor - have an eye exam every 1-2 years  - Safe sex - if you may be exposed to STDs, use a condom.  - Alcohol -  If you drink, do it moderately, less than 2 drinks per day.  - Health Care Power of Attorney. Choose someone to speak for you if you are not able.  - Depression is common in our stressful world.If you're feeling down or losing interest in things you normally enjoy, please come in for a visit.  - Violence - If anyone is threatening or hurting you, please call immediately.       Relevant Orders   CBC with Differential/Platelet   Comprehensive Metabolic Panel (CMET)   TSH   Vitamin D (25 hydroxy)   Lipid panel   Iron deficiency anemia due to chronic blood loss    Chronic, stable Followed by hem      Relevant Orders   Iron, TIBC and Ferritin Panel   Prediabetes    Chronic, stable Continue to recommend balanced, lower carb meals. Smaller meal size, adding snacks. Choosing water as drink of choice and increasing purposeful exercise. Repeat A1c      Relevant Orders   Hemoglobin A1c    Return in about 6 months (around 10/11/2023) for chonic disease management.     I,  Jacky Kindle, FNP, have reviewed all documentation for this visit. The documentation on 04/13/23 for the exam, diagnosis, procedures, and orders are all accurate and  complete.  Jacky Kindle, FNP  [facility] [phone] (phone) [phone] (fax)  [facility]

## 2023-04-13 NOTE — Assessment & Plan Note (Signed)
Chronic, repeat labs to assist F/b hem

## 2023-04-13 NOTE — Assessment & Plan Note (Signed)
Chronic, elevated Goal 130/80 Recommend change to losartan 100-25 from maxzide 25 Continue to monitor

## 2023-04-13 NOTE — Assessment & Plan Note (Signed)

## 2023-04-13 NOTE — Assessment & Plan Note (Signed)
Chronic, stable ?Continue to recommend balanced, lower carb meals. Smaller meal size, adding snacks. Choosing water as drink of choice and increasing purposeful exercise. ?Repeat A1c ? ?

## 2023-04-13 NOTE — Assessment & Plan Note (Signed)
Chronic, stable Followed by hem

## 2023-04-14 LAB — COMPREHENSIVE METABOLIC PANEL
ALT: 68 IU/L — ABNORMAL HIGH (ref 0–32)
AST: 45 IU/L — ABNORMAL HIGH (ref 0–40)
Albumin: 4.5 g/dL (ref 3.8–4.8)
Alkaline Phosphatase: 69 IU/L (ref 44–121)
BUN/Creatinine Ratio: 16 (ref 12–28)
BUN: 18 mg/dL (ref 8–27)
Bilirubin Total: 0.5 mg/dL (ref 0.0–1.2)
CO2: 23 mmol/L (ref 20–29)
Calcium: 10.1 mg/dL (ref 8.7–10.3)
Chloride: 96 mmol/L (ref 96–106)
Creatinine, Ser: 1.11 mg/dL — ABNORMAL HIGH (ref 0.57–1.00)
Globulin, Total: 2.6 g/dL (ref 1.5–4.5)
Glucose: 106 mg/dL — ABNORMAL HIGH (ref 70–99)
Potassium: 4.3 mmol/L (ref 3.5–5.2)
Sodium: 137 mmol/L (ref 134–144)
Total Protein: 7.1 g/dL (ref 6.0–8.5)
eGFR: 52 mL/min/{1.73_m2} — ABNORMAL LOW (ref >59)

## 2023-04-14 LAB — TSH: TSH: 3.24 u[IU]/mL (ref 0.450–4.500)

## 2023-04-14 LAB — CBC WITH DIFFERENTIAL/PLATELET
Basophils Absolute: 0 10*3/uL (ref 0.0–0.2)
Basos: 0 %
EOS (ABSOLUTE): 0.3 10*3/uL (ref 0.0–0.4)
Eos: 4 %
Hematocrit: 43 % (ref 34.0–46.6)
Hemoglobin: 14.5 g/dL (ref 11.1–15.9)
Immature Grans (Abs): 0 10*3/uL (ref 0.0–0.1)
Immature Granulocytes: 0 %
Lymphocytes Absolute: 2.5 10*3/uL (ref 0.7–3.1)
Lymphs: 37 %
MCH: 33 pg (ref 26.6–33.0)
MCHC: 33.7 g/dL (ref 31.5–35.7)
MCV: 98 fL — ABNORMAL HIGH (ref 79–97)
Monocytes Absolute: 0.7 10*3/uL (ref 0.1–0.9)
Monocytes: 11 %
Neutrophils Absolute: 3.2 10*3/uL (ref 1.4–7.0)
Neutrophils: 48 %
Platelets: 259 10*3/uL (ref 150–450)
RBC: 4.4 x10E6/uL (ref 3.77–5.28)
RDW: 13.2 % (ref 11.7–15.4)
WBC: 6.7 10*3/uL (ref 3.4–10.8)

## 2023-04-14 LAB — HEMOGLOBIN A1C
Est. average glucose Bld gHb Est-mCnc: 123 mg/dL
Hgb A1c MFr Bld: 5.9 % — ABNORMAL HIGH (ref 4.8–5.6)

## 2023-04-14 LAB — IRON,TIBC AND FERRITIN PANEL
Ferritin: 116 ng/mL (ref 15–150)
Iron Saturation: 37 % (ref 15–55)
Iron: 133 ug/dL (ref 27–139)
Total Iron Binding Capacity: 362 ug/dL (ref 250–450)
UIBC: 229 ug/dL (ref 118–369)

## 2023-04-14 LAB — LIPID PANEL
Chol/HDL Ratio: 2.3 ratio (ref 0.0–4.4)
Cholesterol, Total: 169 mg/dL (ref 100–199)
HDL: 74 mg/dL (ref >39)
LDL Chol Calc (NIH): 66 mg/dL (ref 0–99)
Triglycerides: 180 mg/dL — ABNORMAL HIGH (ref 0–149)
VLDL Cholesterol Cal: 29 mg/dL (ref 5–40)

## 2023-04-14 LAB — VITAMIN D 25 HYDROXY (VIT D DEFICIENCY, FRACTURES): Vit D, 25-Hydroxy: 43.7 ng/mL (ref 30.0–100.0)

## 2023-04-26 DIAGNOSIS — D2261 Melanocytic nevi of right upper limb, including shoulder: Secondary | ICD-10-CM | POA: Diagnosis not present

## 2023-04-26 DIAGNOSIS — Z85828 Personal history of other malignant neoplasm of skin: Secondary | ICD-10-CM | POA: Diagnosis not present

## 2023-04-26 DIAGNOSIS — D2262 Melanocytic nevi of left upper limb, including shoulder: Secondary | ICD-10-CM | POA: Diagnosis not present

## 2023-04-26 DIAGNOSIS — D225 Melanocytic nevi of trunk: Secondary | ICD-10-CM | POA: Diagnosis not present

## 2023-04-26 DIAGNOSIS — D2272 Melanocytic nevi of left lower limb, including hip: Secondary | ICD-10-CM | POA: Diagnosis not present

## 2023-05-01 ENCOUNTER — Ambulatory Visit (INDEPENDENT_AMBULATORY_CARE_PROVIDER_SITE_OTHER): Payer: Medicare HMO

## 2023-05-01 VITALS — Ht 66.0 in | Wt 172.0 lb

## 2023-05-01 DIAGNOSIS — Z0000 Encounter for general adult medical examination without abnormal findings: Secondary | ICD-10-CM | POA: Diagnosis not present

## 2023-05-01 NOTE — Patient Instructions (Signed)
Ms. Smigel , Thank you for taking time to come for your Medicare Wellness Visit. I appreciate your ongoing commitment to your health goals. Please review the following plan we discussed and let me know if I can assist you in the future.   Referrals/Orders/Follow-Ups/Clinician Recommendations: none  This is a list of the screening recommended for you and due dates:  Health Maintenance  Topic Date Due   DTaP/Tdap/Td vaccine (3 - Td or Tdap) 08/18/2020   COVID-19 Vaccine (3 - Moderna risk series) 05/17/2023*   Flu Shot  10/23/2023*   Medicare Annual Wellness Visit  04/30/2024   DEXA scan (bone density measurement)  04/19/2026   Colon Cancer Screening  06/14/2027   Pneumonia Vaccine  Completed   Hepatitis C Screening  Completed   Zoster (Shingles) Vaccine  Completed   HPV Vaccine  Aged Out  *Topic was postponed. The date shown is not the original due date.    Advanced directives: (In Chart) A copy of your advanced directives are scanned into your chart should your provider ever need it.  Next Medicare Annual Wellness Visit scheduled for next year: Yes 05/06/24 @ 9:35am telephone

## 2023-05-01 NOTE — Progress Notes (Addendum)
Subjective:   Cindy Vega is a 77 y.o. female who presents for Medicare Annual (Subsequent) preventive examination.  Visit Complete: Virtual  I connected with  MILDRETH REEK on 05/01/23 by a audio enabled telemedicine application and verified that I am speaking with the correct person using two identifiers.  Patient Location: Home  Provider Location: Office/Clinic  I discussed the limitations of evaluation and management by telemedicine. The patient expressed understanding and agreed to proceed.  Because this visit was a virtual/telehealth visit, some criteria may be missing or patient reported. Any vitals not documented were not able to be obtained and vitals that have been documented are patient reported.   Patient Medicare AWV questionnaire was completed by the patient on (; I have confirmed that all information answered by patient is correct and no changes since this date. Cardiac Risk Factors include: advanced age (>53men, >76 women);hypertension;dyslipidemia    Objective:    Today's Vitals   05/01/23 1001  Weight: 172 lb (78 kg)  Height: 5\' 6"  (1.676 m)   Body mass index is 27.76 kg/m.     05/01/2023   10:10 AM 08/17/2022   11:37 AM 07/05/2022    8:17 AM 06/28/2022   10:27 AM 04/26/2022   10:34 AM 02/17/2020   10:38 AM 01/19/2020    3:10 PM  Advanced Directives  Does Patient Have a Medical Advance Directive? Yes Yes Yes Yes Yes Yes No  Type of Estate agent of [place];Living will Healthcare Power of [place];Living will Healthcare Power of [place];Living will  Healthcare Power of [place];Living will Healthcare Power of [place];Living will   Does patient want to make changes to medical advance directive?   No - Patient declined  No - Patient declined    Copy of Healthcare Power of Attorney in Chart?  Yes - validated most recent copy scanned in chart (See row information) Yes - validated most recent copy scanned in chart (See row  information)  Yes - validated most recent copy scanned in chart (See row information) No - copy requested     Current Medications (verified) Outpatient Encounter Medications as of 05/01/2023  Medication Sig   aspirin 81 MG tablet Take by mouth every other day.    atorvastatin (LIPITOR) 20 MG tablet TAKE 1 TABLET EVERY DAY   Biotin 5 MG CAPS daily.    cholecalciferol (VITAMIN D) 1000 units tablet Take 1,000 Units by mouth daily.    Cholecalciferol (VITAMIN D3) 50 MCG (2000 UT) capsule Take 2,000 Units by mouth daily.   latanoprost (XALATAN) 0.005 % ophthalmic solution 1 drop at bedtime.   losartan-hydrochlorothiazide (HYZAAR) 100-12.5 MG tablet Take 1 tablet by mouth daily.   MULTIPLE VITAMIN PO Take by mouth daily.    Omega-3 Fatty Acids (FISH OIL) 1000 MG CAPS daily.    psyllium (METAMUCIL) 58.6 % packet Take 1 packet by mouth daily.   No facility-administered encounter medications on file as of 05/01/2023.    Allergies (verified) Silver nitrate   History: Past Medical History:  Diagnosis Date   Anemia    Anxiety    Arthritis    fingers   Cancer (HCC)    basal cell, COLON   Hypertension    PONV (postoperative nausea and vomiting)    after hysterectomy   Pre-diabetes    Sleep apnea    CPAP   Past Surgical History:  Procedure Laterality Date   ABDOMINAL HYSTERECTOMY  1996   PARTIAL   BREAST CYST EXCISION Right 1976  cyst removed-neg   CATARACT EXTRACTION Bilateral    COLONOSCOPY WITH PROPOFOL N/A 03/02/2017   Procedure: COLONOSCOPY WITH PROPOFOL;  Surgeon: Midge Minium, MD;  Location: [facility];  Service: Endoscopy;  Laterality: N/A;   COLONOSCOPY WITH PROPOFOL N/A 06/13/2022   Procedure: COLONOSCOPY WITH PROPOFOL WITH BIOPSY;  Surgeon: Midge Minium, MD;  Location: [facility];  Service: Endoscopy;  Laterality: N/A;  SPOT USED TO MARK ASCENDING COLON POLYP   ESOPHAGOGASTRODUODENOSCOPY (EGD) WITH PROPOFOL N/A 06/13/2022   Procedure:  ESOPHAGOGASTRODUODENOSCOPY (EGD) WITH PROPOFOL;  Surgeon: Midge Minium, MD;  Location: [facility];  Service: Endoscopy;  Laterality: N/A;  Sleep apnea   LAPAROSCOPIC RIGHT COLECTOMY N/A 07/05/2022   Procedure: LAPAROSCOPIC RIGHT COLECTOMY hand assisted, RNFA to assist;  Surgeon: Leafy Ro, MD;  Location: [facility];  Service: General;  Laterality: N/A;   POLYPECTOMY N/A 03/02/2017   Procedure: POLYPECTOMY;  Surgeon: Midge Minium, MD;  Location: [facility];  Service: Endoscopy;  Laterality: N/A;   Family History  Problem Relation Age of Onset   Hypertension Mother    Heart disease Mother    Diabetes Mother    Diabetes Father    Prostate cancer Father    Breast cancer Sister 39   Graves' disease Sister    Breast cancer Sister    Social History   Socioeconomic History   Marital status: Married    Spouse name: [name]   Number of children: 2   Years of education: Not on file   Highest education level: Bachelor's degree (e.g., BA, AB, BS)  Occupational History   Occupation: retired  Tobacco Use   Smoking status: Never   Smokeless tobacco: Never  Vaping Use   Vaping status: Never Used  Substance and Sexual Activity   Alcohol use: Yes    Alcohol/week: 3.0 standard drinks of alcohol    Types: 3 Glasses of wine per week   Drug use: No   Sexual activity: Not on file  Other Topics Concern   Not on file  Social History Narrative   Not on file   Social Determinants of Health   Financial Resource Strain: Low Risk  (05/01/2023)   Overall Financial Resource Strain (CARDIA)    Difficulty of Paying Living Expenses: Not hard at all  Food Insecurity: No Food Insecurity (05/01/2023)   Hunger Vital Sign    Worried About Running Out of Food in the Last Year: Never true    Ran Out of Food in the Last Year: Never true  Transportation Needs: No Transportation Needs (05/01/2023)   PRAPARE - Administrator, Civil Service (Medical): No    Lack of Transportation  (Non-Medical): No  Physical Activity: Insufficiently Active (05/01/2023)   Exercise Vital Sign    Days of Exercise per Week: 2 days    Minutes of Exercise per Session: 60 min  Stress: No Stress Concern Present (05/01/2023)   [employer] of Occupational Health - Occupational Stress Questionnaire    Feeling of Stress : Not at all  Social Connections: Moderately Integrated (05/01/2023)   Social Connection and Isolation Panel [NHANES]    Frequency of Communication with Friends and Family: More than three times a week    Frequency of Social Gatherings with Friends and Family: More than three times a week    Attends Religious Services: More than 4 times per year    Active Member of [employer] or Organizations: No    Attends Banker Meetings: Never  Marital Status: Married    Tobacco Counseling Counseling given: Not Answered   Clinical Intake:  Pre-visit preparation completed: Yes  Pain : No/denies pain     BMI - recorded: 27.76 Nutritional Status: BMI 25 -29 Overweight Nutritional Risks: None Diabetes: No  How often do you need to have someone help you when you read instructions, pamphlets, or other written materials from your doctor or pharmacy?: 1 - Never  Interpreter Needed?: No  Comments: lives with husband Information entered by :: B.Alenah Sarria,LPN   Activities of Daily Living    05/01/2023   10:10 AM 08/04/2022    8:58 AM  In your present state of health, do you have any difficulty performing the following activities:  Hearing? 0 0  Vision? 0 0  Difficulty concentrating or making decisions? 0 0  Walking or climbing stairs? 0 0  Dressing or bathing? 0 0  Doing errands, shopping? 0 0  Preparing Food and eating ? N   Using the Toilet? N   In the past six months, have you accidently leaked urine? N   Do you have problems with loss of bowel control? N   Managing your Medications? N   Managing your Finances? N   Housekeeping or managing your  Housekeeping? N     Patient Care Team: Jacky Kindle, FNP as PCP - General (Family Medicine) [place] Crane, MD as Consulting Physician (Ophthalmology) Debbrah Alar, MD (Dermatology) Benita Gutter, RN as Oncology Nurse Navigator  Indicate any recent Medical Services you may have received from other than [facility] providers in the past year (date may be approximate).     Assessment:   This is a routine wellness examination for Cindy Vega.  Hearing/Vision screen Hearing Screening - Comments:: Pt says she hears just fine Vision Screening - Comments:: Pt vision is good:just wears readers only Dr Brooke Dare   Goals Addressed             This Visit's Progress    COMPLETED: DIET - EAT MORE FRUITS AND VEGETABLES   On track    COMPLETED: DIET - INCREASE WATER INTAKE   On track    Recommend increasing water intake to 6-8 glasses a day.      Exercise 3x per week (30 min per time)   On track    Recommend to start exercising 3 days a week for at least 30 minutes at a time.       Depression Screen    05/01/2023   10:07 AM 08/17/2022   11:54 AM 08/04/2022    8:57 AM 06/01/2022   11:16 AM 04/26/2022   10:32 AM 04/11/2022   10:05 AM 04/20/2021    6:35 PM  PHQ 2/9 Scores  PHQ - 2 Score 0 0 0 0 0 0 0  PHQ- 9 Score  0 0 3 0 0 0    Fall Risk    05/01/2023   10:04 AM 08/15/2022   10:22 AM 08/04/2022    8:58 AM 06/01/2022   11:15 AM 04/26/2022   10:34 AM  Fall Risk   Falls in the past year? 0 0 0 0 0  Number falls in past yr: 0  0 0 0  Injury with Fall? 0  0 0 0  Risk for fall due to : No Fall Risks  No Fall Risks No Fall Risks No Fall Risks  Follow up Education provided;Falls prevention discussed  Falls evaluation completed Falls evaluation completed Falls prevention discussed;Falls evaluation completed  MEDICARE RISK AT HOME: Medicare Risk at Home Any stairs in or around the home?: Yes If so, are there any without handrails?: Yes Home free of loose throw rugs in walkways, pet  beds, electrical cords, etc?: Yes Adequate lighting in your home to reduce risk of falls?: Yes Life alert?: No Use of a cane, walker or w/c?: No Grab bars in the bathroom?: Yes Shower chair or bench in shower?: No Elevated toilet seat or a handicapped toilet?: Yes  TIMED UP AND GO:  Was the test performed?  No    Cognitive Function:        05/01/2023   10:13 AM 04/26/2022   10:36 AM 04/20/2021    6:36 PM  6CIT Screen  What Year? 0 points 0 points   What month? 0 points 0 points 0 points  What time? 0 points 0 points 0 points  Count back from 20 0 points 0 points 0 points  Months in reverse 0 points 0 points 0 points  Repeat phrase 0 points 0 points 0 points  Total Score 0 points 0 points     Immunizations Immunization History  Administered Date(s) Administered   Fluad Quad(high Dose 65+) 03/31/2020, 04/11/2022   Influenza, High Dose Seasonal PF 05/13/2014, 04/01/2016, 05/03/2017   Influenza,inj,Quad PF,6+ Mos 05/08/2013, 04/17/2021   Influenza-Unspecified 05/26/2015, 04/17/2018, 04/17/2021   Moderna SARS-COV2 Booster Vaccination 06/25/2020   Moderna Sars-Covid-2 Vaccination 08/30/2019, 10/01/2019   Pneumococcal Conjugate-13 05/13/2014   Pneumococcal Polysaccharide-23 05/08/2013   Td 04/25/2003   Tdap 08/18/2010   Zoster Recombinant(Shingrix) 02/20/2018, 06/01/2018, 06/25/2018   Zoster, Live 06/02/2008    TDAP status: Up to date  Flu Vaccine status: Due, Education has been provided regarding the importance of this vaccine. Advised may receive this vaccine at local pharmacy or Health Dept. Aware to provide a copy of the vaccination record if obtained from local pharmacy or Health Dept. Verbalized acceptance and understanding.  Pneumococcal vaccine status: Up to date  Covid-19 vaccine status: Completed vaccines  Qualifies for Shingles Vaccine? Yes   Zostavax completed Yes   Shingrix Completed?: Yes  Screening Tests Health Maintenance  Topic Date Due    DTaP/Tdap/Td (3 - Td or Tdap) 08/18/2020   COVID-19 Vaccine (3 - Moderna risk series) 05/17/2023 (Originally 07/23/2020)   INFLUENZA VACCINE  10/23/2023 (Originally 02/23/2023)   Medicare Annual Wellness (AWV)  04/30/2024   DEXA SCAN  04/19/2026   Colonoscopy  06/14/2027   Pneumonia Vaccine 29+ Years old  Completed   Hepatitis C Screening  Completed   Zoster Vaccines- Shingrix  Completed   HPV VACCINES  Aged Out    Health Maintenance  Health Maintenance Due  Topic Date Due   DTaP/Tdap/Td (3 - Td or Tdap) 08/18/2020    Colorectal cancer screening: No longer required.  Scheduled 06/15/23 due to mass removed from colon 12/24  Mammogram status: No longer required due to age.  Bone Density status: Completed 04/19/2021. Results reflect: Bone density results: NORMAL. Repeat every 5 years.  Lung Cancer Screening: (Low Dose CT Chest recommended if Age 16-80 years, 20 pack-year currently smoking OR have quit w/in 15years.) does not qualify.   Lung Cancer Screening Referral: no  Additional Screening:  Hepatitis C Screening: does not qualify; Completed 11/14/2013  Vision Screening: Recommended annual ophthalmology exams for early detection of glaucoma and other disorders of the eye. Is the patient up to date with their annual eye exam?  Yes  Who is the provider or what is the name of the office  in which the patient attends annual eye exams? Dr Willey Blade If pt is not established with a provider, would they like to be referred to a provider to establish care? No .   Dental Screening: Recommended annual dental exams for proper oral hygiene  Diabetic Foot Exam: n/a  Community Resource Referral / Chronic Care Management: CRR required this visit?  Yes   CCM required this visit?  No    Plan:     I have personally reviewed and noted the following in the patient's chart:   Medical and social history Use of alcohol, tobacco or illicit drugs  Current medications and supplements  including opioid prescriptions. Patient is not currently taking opioid prescriptions. Functional ability and status Nutritional status Physical activity Advanced directives List of other physicians Hospitalizations, surgeries, and ER visits in previous 12 months Vitals Screenings to include cognitive, depression, and falls Referrals and appointments  In addition, I have reviewed and discussed with patient certain preventive protocols, quality metrics, and best practice recommendations. A written personalized care plan for preventive services as well as general preventive health recommendations were provided to patient.     Sue Lush, LPN   16/07/958   After Visit Summary: (MyChart) Due to this being a telephonic visit, the after visit summary with patients personalized plan was offered to patient via MyChart   Nurse Notes: The patient states she is doing well and has no concerns or questions at this time.

## 2023-05-17 ENCOUNTER — Ambulatory Visit
Admission: RE | Admit: 2023-05-17 | Discharge: 2023-05-17 | Disposition: A | Discharge status: Home / Self Care | Payer: Medicare HMO | Source: Ambulatory Visit | Attending: Family Medicine | Admitting: Family Medicine

## 2023-05-17 DIAGNOSIS — Z1231 Encounter for screening mammogram for malignant neoplasm of breast: Secondary | ICD-10-CM | POA: Diagnosis not present

## 2023-05-22 ENCOUNTER — Inpatient Hospital Stay: Payer: Medicare HMO | Attending: Internal Medicine

## 2023-05-22 DIAGNOSIS — C182 Malignant neoplasm of ascending colon: Secondary | ICD-10-CM | POA: Diagnosis not present

## 2023-05-31 ENCOUNTER — Other Ambulatory Visit: Payer: Self-pay | Admitting: Family Medicine

## 2023-06-01 LAB — SIGNATERA
SIGNATERA MTM READOUT: 0 MTM/ml
SIGNATERA TEST RESULT: NEGATIVE

## 2023-06-02 ENCOUNTER — Encounter: Payer: Self-pay | Admitting: Internal Medicine

## 2023-06-08 ENCOUNTER — Encounter: Payer: Self-pay | Admitting: Gastroenterology

## 2023-06-15 ENCOUNTER — Other Ambulatory Visit: Payer: Self-pay

## 2023-06-15 ENCOUNTER — Encounter
Admission: RE | Disposition: A | Discharge status: Home / Self Care | Payer: Self-pay | Source: Home / Self Care | Attending: Gastroenterology

## 2023-06-15 ENCOUNTER — Ambulatory Visit: Payer: Self-pay | Admitting: Certified Registered"

## 2023-06-15 ENCOUNTER — Ambulatory Visit
Admission: RE | Admit: 2023-06-15 | Discharge: 2023-06-15 | Disposition: A | Discharge status: Home / Self Care | Payer: Medicare HMO | Attending: Gastroenterology | Admitting: Gastroenterology

## 2023-06-15 ENCOUNTER — Encounter: Payer: Self-pay | Admitting: Gastroenterology

## 2023-06-15 ENCOUNTER — Encounter: Payer: Self-pay | Admitting: Certified Registered"

## 2023-06-15 DIAGNOSIS — Z980 Intestinal bypass and anastomosis status: Secondary | ICD-10-CM | POA: Diagnosis not present

## 2023-06-15 DIAGNOSIS — Z79899 Other long term (current) drug therapy: Secondary | ICD-10-CM | POA: Diagnosis not present

## 2023-06-15 DIAGNOSIS — K5730 Diverticulosis of large intestine without perforation or abscess without bleeding: Secondary | ICD-10-CM | POA: Insufficient documentation

## 2023-06-15 DIAGNOSIS — I10 Essential (primary) hypertension: Secondary | ICD-10-CM | POA: Diagnosis not present

## 2023-06-15 DIAGNOSIS — Z9049 Acquired absence of other specified parts of digestive tract: Secondary | ICD-10-CM | POA: Insufficient documentation

## 2023-06-15 DIAGNOSIS — G4730 Sleep apnea, unspecified: Secondary | ICD-10-CM | POA: Diagnosis not present

## 2023-06-15 DIAGNOSIS — Z85828 Personal history of other malignant neoplasm of skin: Secondary | ICD-10-CM | POA: Insufficient documentation

## 2023-06-15 DIAGNOSIS — Z09 Encounter for follow-up examination after completed treatment for conditions other than malignant neoplasm: Secondary | ICD-10-CM | POA: Diagnosis not present

## 2023-06-15 DIAGNOSIS — K640 First degree hemorrhoids: Secondary | ICD-10-CM | POA: Insufficient documentation

## 2023-06-15 DIAGNOSIS — Z1211 Encounter for screening for malignant neoplasm of colon: Secondary | ICD-10-CM

## 2023-06-15 DIAGNOSIS — Z85038 Personal history of other malignant neoplasm of large intestine: Secondary | ICD-10-CM

## 2023-06-15 HISTORY — PX: COLONOSCOPY WITH PROPOFOL: SHX5780

## 2023-06-15 SURGERY — COLONOSCOPY WITH PROPOFOL
Anesthesia: General

## 2023-06-15 MED ORDER — SODIUM CHLORIDE 0.9 % IV SOLN: INTRAVENOUS | Status: DC

## 2023-06-15 MED ORDER — PROPOFOL 10 MG/ML IV BOLUS
INTRAVENOUS | Status: DC | PRN
  Administered 2023-06-15: 150 ug/kg/min via INTRAVENOUS
  Administered 2023-06-15: 50 mg via INTRAVENOUS

## 2023-06-15 MED ORDER — LIDOCAINE HCL (CARDIAC) PF 100 MG/5ML IV SOSY
PREFILLED_SYRINGE | INTRAVENOUS | Status: DC | PRN
  Administered 2023-06-15: 100 mg via INTRAVENOUS

## 2023-06-15 MED ORDER — PROPOFOL 1000 MG/100ML IV EMUL
INTRAVENOUS | Status: AC
  Filled 2023-06-15: qty 100

## 2023-06-15 NOTE — Op Note (Signed)
[facility] Gastroenterology Patient Name: Cindy Vega Procedure Date: 06/15/2023 7:55 AM MRN: [id number] Account #: [id number] Date of Birth: [birth year] Admit Type: Outpatient Age: 77 Room: [facility] ENDO ROOM 4 Gender: Female Note Status: Finalized Instrument Name: Prentice Docker [id number] Procedure:             Colonoscopy Indications:           High risk colon cancer surveillance: Personal history                         of colon cancer Providers:             Midge Minium MD, MD Referring MD:          Daryl Eastern. Suzie Portela (Referring MD) Medicines:             Propofol per Anesthesia Complications:         No immediate complications. Procedure:             Pre-Anesthesia Assessment:                        - Prior to the procedure, a History and Physical was                         performed, and patient medications and allergies were                         reviewed. The patient's tolerance of previous                         anesthesia was also reviewed. The risks and benefits                         of the procedure and the sedation options and risks                         were discussed with the patient. All questions were                         answered, and informed consent was obtained. Prior                         Anticoagulants: The patient has taken no anticoagulant                         or antiplatelet agents. ASA Grade Assessment: II - A                         patient with mild systemic disease. After reviewing                         the risks and benefits, the patient was deemed in                         satisfactory condition to undergo the procedure.                        After obtaining informed consent, the colonoscope was  passed under direct vision. Throughout the procedure,                         the patient's blood pressure, pulse, and oxygen                         saturations were monitored continuously. The                          Colonoscope was introduced through the anus and                         advanced to the the ileocolonic anastomosis. The                         colonoscopy was performed without difficulty. The                         patient tolerated the procedure well. The quality of                         the bowel preparation was excellent. Findings:      The perianal and digital rectal examinations were normal.      There was evidence of a prior end-to-side colo-colonic anastomosis in       the transverse colon. This was patent. The anastomosis was traversed.      Many small-mouthed diverticula were found in the entire colon.      Non-bleeding internal hemorrhoids were found during retroflexion. The       hemorrhoids were Grade I (internal hemorrhoids that do not prolapse). Impression:            - Patent end-to-side colo-colonic anastomosis.                        - Diverticulosis in the entire examined colon.                        - Non-bleeding internal hemorrhoids.                        - No specimens collected. Recommendation:        - Discharge patient to home.                        - Resume previous diet.                        - Continue present medications.                        - Repeat colonoscopy in 3 years for surveillance. Procedure Code(s):     --- Professional ---                        [phone], Colonoscopy, flexible; diagnostic, including                         collection of specimen(s) by brushing or washing, when  performed (separate procedure) Diagnosis Code(s):     --- Professional ---                        Z61.096, Personal history of other malignant neoplasm                         of large intestine CPT copyright 2022 American Medical Association. All rights reserved. The codes documented in this report are preliminary and upon coder review may  be revised to meet current compliance requirements. Midge Minium MD, MD 06/15/2023  8:10:00 AM This report has been signed electronically. Number of Addenda: 0 Note Initiated On: 06/15/2023 7:55 AM Scope Withdrawal Time: 0 hours 3 minutes 11 seconds  Total Procedure Duration: 0 hours 5 minutes 51 seconds  Estimated Blood Loss:  Estimated blood loss: none.      [facility]

## 2023-06-15 NOTE — Anesthesia Postprocedure Evaluation (Signed)
Anesthesia Post Note  Patient: Cindy Vega  Procedure(s) Performed: COLONOSCOPY WITH PROPOFOL  Patient location during evaluation: Endoscopy Anesthesia Type: General Level of consciousness: awake and alert Pain management: pain level controlled Vital Signs Assessment: post-procedure vital signs reviewed and stable Respiratory status: spontaneous breathing, nonlabored ventilation, respiratory function stable and patient connected to nasal cannula oxygen Cardiovascular status: blood pressure returned to baseline and stable Postop Assessment: no apparent nausea or vomiting Anesthetic complications: no   There were no known notable events for this encounter.   Last Vitals:  Vitals:   06/15/23 0809 06/15/23 0829  BP: (!) 96/48 94/78  Pulse: 73   Resp: 18   Temp: (!) 36.3 C   SpO2: 100%     Last Pain:  Vitals:   06/15/23 0829  TempSrc:   PainSc: 0-No pain                 Louie Boston

## 2023-06-15 NOTE — H&P (Signed)
Midge Minium, MD [facility] [address] [place], [place] [address] Phone:[phone] Fax : [phone]  Primary Care Physician:  Jacky Kindle, FNP Primary Gastroenterologist:  Dr. Servando Snare  Pre-Procedure History & Physical: HPI:  Cindy Vega is a 77 y.o. female is here for an colonoscopy.   Past Medical History:  Diagnosis Date   Anemia    Anxiety    Arthritis    fingers   Cancer (HCC)    basal cell, COLON   Hypertension    PONV (postoperative nausea and vomiting)    after hysterectomy   Pre-diabetes    Sleep apnea    CPAP    Past Surgical History:  Procedure Laterality Date   ABDOMINAL HYSTERECTOMY  1996   PARTIAL   BREAST CYST EXCISION Right 1976   cyst removed-neg   CATARACT EXTRACTION Bilateral    COLONOSCOPY WITH PROPOFOL N/A 03/02/2017   Procedure: COLONOSCOPY WITH PROPOFOL;  Surgeon: Midge Minium, MD;  Location: [facility];  Service: Endoscopy;  Laterality: N/A;   COLONOSCOPY WITH PROPOFOL N/A 06/13/2022   Procedure: COLONOSCOPY WITH PROPOFOL WITH BIOPSY;  Surgeon: Midge Minium, MD;  Location: [facility];  Service: Endoscopy;  Laterality: N/A;  SPOT USED TO MARK ASCENDING COLON POLYP   ESOPHAGOGASTRODUODENOSCOPY (EGD) WITH PROPOFOL N/A 06/13/2022   Procedure: ESOPHAGOGASTRODUODENOSCOPY (EGD) WITH PROPOFOL;  Surgeon: Midge Minium, MD;  Location: [facility];  Service: Endoscopy;  Laterality: N/A;  Sleep apnea   LAPAROSCOPIC RIGHT COLECTOMY N/A 07/05/2022   Procedure: LAPAROSCOPIC RIGHT COLECTOMY hand assisted, RNFA to assist;  Surgeon: Leafy Ro, MD;  Location: [facility] ORS;  Service: General;  Laterality: N/A;   POLYPECTOMY N/A 03/02/2017   Procedure: POLYPECTOMY;  Surgeon: Midge Minium, MD;  Location: [facility];  Service: Endoscopy;  Laterality: N/A;    Prior to Admission medications   Medication Sig Start Date End Date Taking? Authorizing Provider  aspirin 81 MG tablet Take by mouth every other day.  11/15/07   Yes [provider]  atorvastatin (LIPITOR) 20 MG tablet TAKE 1 TABLET EVERY DAY 01/05/23  Yes Jacky Kindle, FNP  Biotin 5 MG CAPS daily.  01/19/09  Yes [provider]  cholecalciferol (VITAMIN D) 1000 units tablet Take 1,000 Units by mouth daily.  05/12/10  Yes [provider]  Cholecalciferol (VITAMIN D3) 50 MCG (2000 UT) capsule Take 2,000 Units by mouth daily.   Yes [provider]  FLUoxetine (PROZAC) 10 MG capsule TAKE 1 CAPSULE EVERY DAY 05/31/23  Yes Jacky Kindle, FNP  latanoprost (XALATAN) 0.005 % ophthalmic solution 1 drop at bedtime.   Yes [provider]  losartan-hydrochlorothiazide (HYZAAR) 100-12.5 MG tablet Take 1 tablet by mouth daily. 04/13/23  Yes Merita Norton T, FNP  MULTIPLE VITAMIN PO Take by mouth daily.  05/12/10  Yes [provider]  Omega-3 Fatty Acids (FISH OIL) 1000 MG CAPS daily.  11/22/07  Yes [provider]  psyllium (METAMUCIL) 58.6 % packet Take 1 packet by mouth daily. Patient not taking: Reported on 06/15/2023    [provider]    Allergies as of 10/19/2022 - Review Complete 10/17/2022  Allergen Reaction Noted   Silver nitrate  09/02/2015    Family History  Problem Relation Age of Onset   Hypertension Mother    Heart disease Mother    Diabetes Mother    Diabetes Father    Prostate cancer Father    Breast cancer Sister 53   Graves' disease Sister  Breast cancer Sister     Social History   Socioeconomic History   Marital status: Married    Spouse name: Cindy Vega   Number of children: 2   Years of education: Not on file   Highest education level: Bachelor's degree (e.g., BA, AB, BS)  Occupational History   Occupation: retired  Tobacco Use   Smoking status: Never   Smokeless tobacco: Never  Vaping Use   Vaping status: Never Used  Substance and Sexual Activity   Alcohol use: Yes    Alcohol/week: 3.0 standard drinks of alcohol    Types: 3 Glasses of wine per week    Drug use: No   Sexual activity: Not on file  Other Topics Concern   Not on file  Social History Narrative   Not on file   Social Determinants of Health   Financial Resource Strain: Low Risk  (05/01/2023)   Overall Financial Resource Strain (CARDIA)    Difficulty of Paying Living Expenses: Not hard at all  Food Insecurity: No Food Insecurity (05/01/2023)   Hunger Vital Sign    Worried About Running Out of Food in the Last Year: Never true    Ran Out of Food in the Last Year: Never true  Transportation Needs: No Transportation Needs (05/01/2023)   PRAPARE - Administrator, Civil Service (Medical): No    Lack of Transportation (Non-Medical): No  Physical Activity: Insufficiently Active (05/01/2023)   Exercise Vital Sign    Days of Exercise per Week: 2 days    Minutes of Exercise per Session: 60 min  Stress: No Stress Concern Present (05/01/2023)   [employer] of Occupational Health - Occupational Stress Questionnaire    Feeling of Stress : Not at all  Social Connections: Moderately Integrated (05/01/2023)   Social Connection and Isolation Panel [NHANES]    Frequency of Communication with Friends and Family: More than three times a week    Frequency of Social Gatherings with Friends and Family: More than three times a week    Attends Religious Services: More than 4 times per year    Active Member of [employer] or Organizations: No    Attends Banker Meetings: Never    Marital Status: Married  Catering manager Violence: Not At Risk (05/01/2023)   Humiliation, Afraid, Rape, and Kick questionnaire    Fear of Current or Ex-Partner: No    Emotionally Abused: No    Physically Abused: No    Sexually Abused: No    Review of Systems: See HPI, otherwise negative ROS  Physical Exam: BP (!) 168/86   Pulse 98   Temp 97.6 F (36.4 C) (Temporal)   Resp 16   Ht 5\' 6"  (1.676 m)   Wt 79.6 kg   SpO2 100%   BMI 28.31 kg/m  General:   Alert,  pleasant and cooperative  in NAD Head:  Normocephalic and atraumatic. Neck:  Supple; no masses or thyromegaly. Lungs:  Clear throughout to auscultation.    Heart:  Regular rate and rhythm. Abdomen:  Soft, nontender and nondistended. Normal bowel sounds, without guarding, and without rebound.   Neurologic:  Alert and  oriented x4;  grossly normal neurologically.  Impression/Plan: Cindy Vega is here for an colonoscopy to be performed for person history of colon cancer  Risks, benefits, limitations, and alternatives regarding  colonoscopy have been reviewed with the patient.  Questions have been answered.  All parties agreeable.   Midge Minium, MD  06/15/2023, 7:47 AM

## 2023-06-15 NOTE — Transfer of Care (Signed)
Immediate Anesthesia Transfer of Care Note  Patient: Cindy Vega  Procedure(s) Performed: COLONOSCOPY WITH PROPOFOL  Patient Location: PACU  Anesthesia Type:General  Level of Consciousness: awake, alert , and oriented  Airway & Oxygen Therapy: Patient Spontanous Breathing  Post-op Assessment: Report given to RN and Post -op Vital signs reviewed and stable  Post vital signs: stable  Last Vitals:  Vitals Value Taken Time  BP 96/48 06/15/23 0809  Temp 36.3 C 06/15/23 0809  Pulse 73 06/15/23 0810  Resp 21 06/15/23 0810  SpO2 100 % 06/15/23 0810  Vitals shown include unfiled device data.  Last Pain:  Vitals:   06/15/23 0809  TempSrc: Temporal  PainSc: 0-No pain         Complications: No notable events documented.

## 2023-06-15 NOTE — Anesthesia Preprocedure Evaluation (Signed)
Anesthesia Evaluation  Patient identified by MRN, date of birth, ID band Patient awake    Reviewed: Allergy & Precautions, NPO status , Patient's Chart, lab work & pertinent test results  History of Anesthesia Complications (+) PONV and history of anesthetic complications  Airway Mallampati: III  TM Distance: >3 FB Neck ROM: full    Dental no notable dental hx.    Pulmonary sleep apnea and Continuous Positive Airway Pressure Ventilation    Pulmonary exam normal        Cardiovascular hypertension, On Medications negative cardio ROS Normal cardiovascular exam     Neuro/Psych  PSYCHIATRIC DISORDERS Anxiety     negative neurological ROS     GI/Hepatic negative GI ROS, Neg liver ROS,,,  Endo/Other  negative endocrine ROS    Renal/GU negative Renal ROS  negative genitourinary   Musculoskeletal   Abdominal   Peds  Hematology  (+) Blood dyscrasia, anemia   Anesthesia Other Findings Past Medical History: No date: Anemia No date: Anxiety No date: Arthritis     Comment:  fingers No date: Cancer (HCC)     Comment:  basal cell, COLON No date: Hypertension No date: PONV (postoperative nausea and vomiting)     Comment:  after hysterectomy No date: Pre-diabetes No date: Sleep apnea     Comment:  CPAP  Past Surgical History: 1996: ABDOMINAL HYSTERECTOMY     Comment:  PARTIAL 1976: BREAST CYST EXCISION; Right     Comment:  cyst removed-neg No date: CATARACT EXTRACTION; Bilateral 03/02/2017: COLONOSCOPY WITH PROPOFOL; N/A     Comment:  Procedure: COLONOSCOPY WITH PROPOFOL;  Surgeon: Midge Minium, MD;  Location: [facility];  Service:               Endoscopy;  Laterality: N/A; 06/13/2022: COLONOSCOPY WITH PROPOFOL; N/A     Comment:  Procedure: COLONOSCOPY WITH PROPOFOL WITH BIOPSY;                Surgeon: Midge Minium, MD;  Location: [facility]               [facility];  Service: Endoscopy;   Laterality: N/A;  SPOT USED               TO MARK ASCENDING COLON POLYP 06/13/2022: ESOPHAGOGASTRODUODENOSCOPY (EGD) WITH PROPOFOL; N/A     Comment:  Procedure: ESOPHAGOGASTRODUODENOSCOPY (EGD) WITH               PROPOFOL;  Surgeon: Midge Minium, MD;  Location: [facility]               [facility];  Service: Endoscopy;  Laterality: N/A;                Sleep apnea 07/05/2022: LAPAROSCOPIC RIGHT COLECTOMY; N/A     Comment:  Procedure: LAPAROSCOPIC RIGHT COLECTOMY hand assisted,               RNFA to assist;  Surgeon: Leafy Ro, MD;  Location:               [facility] ORS;  Service: General;  Laterality: N/A; 03/02/2017: POLYPECTOMY; N/A     Comment:  Procedure: POLYPECTOMY;  Surgeon: Midge Minium, MD;                Location: [facility];  Service: Endoscopy;                Laterality: N/A;  BMI    Body Mass Index: 28.31 kg/m      Reproductive/Obstetrics negative OB ROS                              Anesthesia Physical Anesthesia Plan  ASA: 2  Anesthesia Plan: General   Post-op Pain Management: Minimal or no pain anticipated   Induction: Intravenous  PONV Risk Score and Plan: 3 and Propofol infusion and TIVA  Airway Management Planned: Natural Airway and Nasal Cannula  Additional Equipment:   Intra-op Plan:   Post-operative Plan:   Informed Consent: I have reviewed the patients History and Physical, chart, labs and discussed the procedure including the risks, benefits and alternatives for the proposed anesthesia with the patient or authorized representative who has indicated his/her understanding and acceptance.     Dental Advisory Given  Plan Discussed with: Anesthesiologist, CRNA and Surgeon  Anesthesia Plan Comments: (Patient consented for risks of anesthesia including but not limited to:  - adverse reactions to medications - risk of airway placement if required - damage to eyes, teeth, lips or other oral mucosa - nerve damage due  to positioning  - sore throat or hoarseness - Damage to heart, brain, nerves, lungs, other parts of body or loss of life  Patient voiced understanding and assent.)         Anesthesia Quick Evaluation

## 2023-06-16 ENCOUNTER — Encounter: Payer: Self-pay | Admitting: Gastroenterology

## 2023-07-20 ENCOUNTER — Encounter: Payer: Self-pay | Admitting: Emergency Medicine

## 2023-07-20 ENCOUNTER — Ambulatory Visit
Admission: EM | Admit: 2023-07-20 | Discharge: 2023-07-20 | Disposition: A | Discharge status: Home / Self Care | Payer: Medicare HMO | Attending: Emergency Medicine | Admitting: Emergency Medicine

## 2023-07-20 DIAGNOSIS — L089 Local infection of the skin and subcutaneous tissue, unspecified: Secondary | ICD-10-CM | POA: Insufficient documentation

## 2023-07-20 DIAGNOSIS — L720 Epidermal cyst: Secondary | ICD-10-CM | POA: Insufficient documentation

## 2023-07-20 MED ORDER — DOXYCYCLINE HYCLATE 100 MG PO CAPS: 100.0000 mg | ORAL_CAPSULE | Freq: Two times a day (BID) | ORAL | 0 refills | Status: AC

## 2023-07-20 NOTE — ED Provider Notes (Signed)
HPI  SUBJECTIVE:  Cindy Vega is a 77 y.o. female who presents with a painful, erythematous mass of gradually increasing size on her right neck over the past 2 weeks.  She describes the pain as constant, burning and throbbing.  No drainage, trauma, known insect bite, fevers, limitation of motion of her neck.  She has had a mass in this region which was diagnosed as epithelial inclusion cyst by her dermatologist in October.  No antipyretic in the past 6 hours.  She has been doing Tylenol, warm compresses.  The Tylenol helps.  No aggravating factors.  No contacts with MRSA.  She has a past medical history of colon cancer and aplastic anemia.  No history of MRSA.  PCP: [facility].  Dermatology: [facility].  She has follow-up with them next month.   Past Medical History:  Diagnosis Date   Anemia    Anxiety    Arthritis    fingers   Cancer (HCC)    basal cell, COLON   Hypertension    PONV (postoperative nausea and vomiting)    after hysterectomy   Pre-diabetes    Sleep apnea    CPAP    Past Surgical History:  Procedure Laterality Date   ABDOMINAL HYSTERECTOMY  1996   PARTIAL   BREAST CYST EXCISION Right 1976   cyst removed-neg   CATARACT EXTRACTION Bilateral    COLONOSCOPY WITH PROPOFOL N/A 03/02/2017   Procedure: COLONOSCOPY WITH PROPOFOL;  Surgeon: Midge Minium, MD;  Location: [facility];  Service: Endoscopy;  Laterality: N/A;   COLONOSCOPY WITH PROPOFOL N/A 06/13/2022   Procedure: COLONOSCOPY WITH PROPOFOL WITH BIOPSY;  Surgeon: Midge Minium, MD;  Location: [facility];  Service: Endoscopy;  Laterality: N/A;  SPOT USED TO MARK ASCENDING COLON POLYP   COLONOSCOPY WITH PROPOFOL N/A 06/15/2023   Procedure: COLONOSCOPY WITH PROPOFOL;  Surgeon: Midge Minium, MD;  Location: [facility] END[facility];  Service: Endoscopy;  Laterality: N/A;   ESOPHAGOGASTRODUODENOSCOPY (EGD) WITH PROPOFOL N/A 06/13/2022   Procedure: ESOPHAGOGASTRODUODENOSCOPY (EGD)  WITH PROPOFOL;  Surgeon: Midge Minium, MD;  Location: [facility];  Service: Endoscopy;  Laterality: N/A;  Sleep apnea   LAPAROSCOPIC RIGHT COLECTOMY N/A 07/05/2022   Procedure: LAPAROSCOPIC RIGHT COLECTOMY hand assisted, RNFA to assist;  Surgeon: Leafy Ro, MD;  Location: [facility] ORS;  Service: General;  Laterality: N/A;   POLYPECTOMY N/A 03/02/2017   Procedure: POLYPECTOMY;  Surgeon: Midge Minium, MD;  Location: [facility];  Service: Endoscopy;  Laterality: N/A;    Family History  Problem Relation Age of Onset   Hypertension Mother    Heart disease Mother    Diabetes Mother    Diabetes Father    Prostate cancer Father    Breast cancer Sister 44   Graves' disease Sister    Breast cancer Sister     Social History   Tobacco Use   Smoking status: Never   Smokeless tobacco: Never  Vaping Use   Vaping status: Never Used  Substance Use Topics   Alcohol use: Yes    Alcohol/week: 3.0 standard drinks of alcohol    Types: 3 Glasses of wine per week   Drug use: No    No current facility-administered medications for this encounter.  Current Outpatient Medications:    aspirin 81 MG tablet, Take by mouth every other day. , Disp: , Rfl:    atorvastatin (LIPITOR) 20 MG tablet, TAKE 1 TABLET EVERY DAY, Disp: 90 tablet, Rfl: 3   Biotin 5 MG CAPS,  daily. , Disp: , Rfl:    cholecalciferol (VITAMIN D) 1000 units tablet, Take 1,000 Units by mouth daily. , Disp: , Rfl:    Cholecalciferol (VITAMIN D3) 50 MCG (2000 UT) capsule, Take 2,000 Units by mouth daily., Disp: , Rfl:    doxycycline (VIBRAMYCIN) 100 MG capsule, Take 1 capsule (100 mg total) by mouth 2 (two) times daily for 7 days., Disp: 14 capsule, Rfl: 0   latanoprost (XALATAN) 0.005 % ophthalmic solution, 1 drop at bedtime., Disp: , Rfl:    losartan-hydrochlorothiazide (HYZAAR) 100-12.5 MG tablet, Take 1 tablet by mouth daily., Disp: 90 tablet, Rfl: 3   MULTIPLE VITAMIN PO, Take by mouth daily. , Disp: , Rfl:     Omega-3 Fatty Acids (FISH OIL) 1000 MG CAPS, daily. , Disp: , Rfl:    FLUoxetine (PROZAC) 10 MG capsule, TAKE 1 CAPSULE EVERY DAY, Disp: 90 capsule, Rfl: 3   psyllium (METAMUCIL) 58.6 % packet, Take 1 packet by mouth daily. (Patient not taking: Reported on 06/15/2023), Disp: , Rfl:   Allergies  Allergen Reactions   Silver Nitrate     LIQUID NITRATE used to remove warts     ROS  As noted in HPI.   Physical Exam  BP (!) 154/75 (BP Location: Left Arm)   Pulse 71   Temp 98.3 F (36.8 C) (Oral)   Resp 16   SpO2 100%   Constitutional: Well developed, well nourished, no acute distress Eyes:  EOMI, conjunctiva normal bilaterally HENT: Normocephalic, atraumatic,mucus membranes moist Respiratory: Normal inspiratory effort Cardiovascular: Normal rate GI: nondistended Skin 3 x 2 cm tender area of induration with central fluctuance posterior neck with no surrounding lymphadenopathy .positive expressible purulent drainage   Musculoskeletal: no deformities Neurologic: Alert & oriented x 3, no focal neuro deficits Psychiatric: Speech and behavior appropriate   ED Course   Medications - No data to display  Orders Placed This Encounter  Procedures   Aerobic Culture w Gram Stain (superficial specimen)    Standing Status:   Standing    Number of Occurrences:   1    No results found for this or any previous visit (from the past 24 hours). No results found.  ED Clinical Impression  1. Infected epithelial inclusion cyst      ED Assessment/Plan     No dermatology records available in [facility] or through Care Everywhere.  I was able to express some purulent drainage without doing an I&D.  Sending this off for culture to confirm antibiotic choice.  Home with doxycycline for 1 week.  I have decided to not I&D this today as it is spontaneously draining, and discussed with patient that if I cut it open, it could interfere with definitive treatment.  Warm compresses, 400 mg of  ibuprofen combined with 1000 mg Tylenol 3 times a day.  Advised patient to give her dermatologist to call and move up the appointment to be seen sooner.  [phone]  Discussed  MDM, treatment plan, and plan for follow-up with patient. patient agrees with plan.   Meds ordered this encounter  Medications   doxycycline (VIBRAMYCIN) 100 MG capsule    Sig: Take 1 capsule (100 mg total) by mouth 2 (two) times daily for 7 days.    Dispense:  14 capsule    Refill:  0      *This clinic note was created using Scientist, clinical (histocompatibility and immunogenetics). Therefore, there may be occasional mistakes despite careful proofreading.  ?    Domenick Gong, MD 07/23/23 1330

## 2023-07-20 NOTE — ED Triage Notes (Signed)
Pt presents with a cyst on the right side of her neck that has redness and tender to touch x 1 week. Pt was seen 6 months ago by a dermatologist but the cyst was smaller then and hurting.

## 2023-07-20 NOTE — Discharge Instructions (Addendum)
We have sent a wound culture off to make sure that you are on the correct antibiotic.  We will contact you if we need to change anything.  Finish the doxycycline, even if you feel better.  Warm compresses, 400 mg of ibuprofen combined with 1000 mg Tylenol 3 times a day.  Give your dermatologist a call and move up the appointment to be seen sooner.

## 2023-07-25 LAB — AEROBIC CULTURE W GRAM STAIN (SUPERFICIAL SPECIMEN): Culture: NO GROWTH

## 2023-08-09 ENCOUNTER — Other Ambulatory Visit: Payer: Self-pay | Admitting: *Deleted

## 2023-08-18 ENCOUNTER — Ambulatory Visit: Payer: Medicare HMO | Admitting: Internal Medicine

## 2023-08-18 ENCOUNTER — Other Ambulatory Visit: Payer: Medicare HMO

## 2023-08-21 ENCOUNTER — Other Ambulatory Visit: Payer: Self-pay

## 2023-08-21 DIAGNOSIS — C182 Malignant neoplasm of ascending colon: Secondary | ICD-10-CM

## 2023-08-22 ENCOUNTER — Encounter: Payer: Self-pay | Admitting: Internal Medicine

## 2023-08-22 ENCOUNTER — Inpatient Hospital Stay: Payer: Medicare HMO | Attending: Internal Medicine

## 2023-08-22 ENCOUNTER — Inpatient Hospital Stay: Payer: Medicare HMO | Admitting: Internal Medicine

## 2023-08-22 VITALS — BP 145/76 | HR 79 | Temp 97.0°F | Ht 66.0 in | Wt 180.0 lb

## 2023-08-22 DIAGNOSIS — C182 Malignant neoplasm of ascending colon: Secondary | ICD-10-CM | POA: Diagnosis not present

## 2023-08-22 DIAGNOSIS — Z79899 Other long term (current) drug therapy: Secondary | ICD-10-CM | POA: Diagnosis not present

## 2023-08-22 DIAGNOSIS — K760 Fatty (change of) liver, not elsewhere classified: Secondary | ICD-10-CM | POA: Diagnosis not present

## 2023-08-22 LAB — CBC WITH DIFFERENTIAL (CANCER CENTER ONLY)
Abs Immature Granulocytes: 0.03 10*3/uL (ref 0.00–0.07)
Basophils Absolute: 0 10*3/uL (ref 0.0–0.1)
Basophils Relative: 1 %
Eosinophils Absolute: 0.2 10*3/uL (ref 0.0–0.5)
Eosinophils Relative: 4 %
HCT: 37.6 % (ref 36.0–46.0)
Hemoglobin: 13 g/dL (ref 12.0–15.0)
Immature Granulocytes: 0 %
Lymphocytes Relative: 31 %
Lymphs Abs: 2.1 10*3/uL (ref 0.7–4.0)
MCH: 32.5 pg (ref 26.0–34.0)
MCHC: 34.6 g/dL (ref 30.0–36.0)
MCV: 94 fL (ref 80.0–100.0)
Monocytes Absolute: 0.7 10*3/uL (ref 0.1–1.0)
Monocytes Relative: 10 %
Neutro Abs: 3.7 10*3/uL (ref 1.7–7.7)
Neutrophils Relative %: 54 %
Platelet Count: 220 10*3/uL (ref 150–400)
RBC: 4 MIL/uL (ref 3.87–5.11)
RDW: 12.5 % (ref 11.5–15.5)
WBC Count: 6.8 10*3/uL (ref 4.0–10.5)
nRBC: 0 % (ref 0.0–0.2)

## 2023-08-22 LAB — CMP (CANCER CENTER ONLY)
ALT: 45 U/L — ABNORMAL HIGH (ref 0–44)
AST: 37 U/L (ref 15–41)
Albumin: 4.1 g/dL (ref 3.5–5.0)
Alkaline Phosphatase: 50 U/L (ref 38–126)
Anion gap: 10 (ref 5–15)
BUN: 20 mg/dL (ref 8–23)
CO2: 24 mmol/L (ref 22–32)
Calcium: 9.3 mg/dL (ref 8.9–10.3)
Chloride: 100 mmol/L (ref 98–111)
Creatinine: 0.95 mg/dL (ref 0.44–1.00)
GFR, Estimated: >60 mL/min (ref >60)
Glucose, Bld: 113 mg/dL — ABNORMAL HIGH (ref 70–99)
Potassium: 4 mmol/L (ref 3.5–5.1)
Sodium: 134 mmol/L — ABNORMAL LOW (ref 135–145)
Total Bilirubin: 1 mg/dL (ref 0.0–1.2)
Total Protein: 7.4 g/dL (ref 6.5–8.1)

## 2023-08-22 NOTE — Progress Notes (Signed)
[facility] OFFICE PROGRESS NOTE  Patient Care Team: Pardue, Monico Blitz, DO as PCP - General (Family Medicine) Nevada Crane, MD as Consulting Physician (Ophthalmology) Debbrah Alar, MD (Dermatology) Benita Gutter, RN as Oncology Nurse Navigator Earna Coder, MD as Consulting Physician (Oncology)   Cancer Staging  Cancer of right colon [facility]) Staging form: Colon and Rectum, AJCC 8th Edition - Clinical: Stage IIA (cT3, cN0, cM0) - Signed by Earna Coder, MD on 08/17/2022 Total positive nodes: 0    Oncology History Overview Note  # December 2023-screening colonoscopy [anemia]; ascending colon mass; right hemicolectomy [Dr.Pabone]-stage II colon cancer pT3N0- [low risk]- no adjuvant therapy; MMR stable   DIAGNOSIS: A.  Colon, right; colectomy: - 3.1 CM INVASIVE MODERATELY DIFFERENTIATED ADENOCARCINOMA, MARGINS NEGATIVE, NO EVIDENCE OF METASTATIC CARCINOMA IN 32 LYMPH NODES.  CANCER CASE SUMMARY: COLON AND RECTUM Standard(s): AJCC-UICC 8  SPECIMEN Procedure: Right colectomy  TUMOR Tumor Site: Ascending colon Histologic Type: Adenocarcinoma Histologic Grade: G2, moderately differentiated Tumor Size: Greatest dimension in Centimeters: 3.1 cm Tumor Extent: Invades through muscularis propria into the pericolonic tissue. Macroscopic Tumor Perforation: Not identified Lymph-Vascular Invasion: Not identified Perineural Invasion: Not identified Treatment Effect: No known presurgical therapy  MARGINS Margin Status for Invasive Carcinoma: All margins negative for invasive carcinoma                 Closest margin to invasive carcinoma: Mesenteric.                 Distance from invasive carcinoma to closest margin: 0.5 mm REGIONAL LYMPH NODES Regional Lymph Nodes: Regional lymph nodes present                                         All regional lymph nodes negative for tumor (0/32)  Tumor Deposits: Not identified  PATHOLOGIC STAGE  CLASSIFICATION (pTNM, AJCC 8th Edition): TNM Descriptors: Not applicable pT3 pN0 pM not applicable    Cancer of right colon (HCC)  07/05/2022 Initial Diagnosis   Cancer of right colon (HCC)   08/17/2022 Cancer Staging   Staging form: Colon and Rectum, AJCC 8th Edition - Clinical: Stage IIA (cT3, cN0, cM0) - Signed by Earna Coder, MD on 08/17/2022 Total positive nodes: 0     INTERVAL HISTORY: Ambulating independently.  Alone.   Cindy Vega 78 y.o.  female pleasant patient with stage II colon cancer- low risk on signetera surveillaince.   Patient had a  surveillance colonoscopy in [facility] 2024.  She denies any worsening shortness of breath or cough.  Denies any blood in stools or black or stools.   Review of Systems  Constitutional:  Negative for chills, diaphoresis, fever, malaise/fatigue and weight loss.  HENT:  Negative for nosebleeds and sore throat.   Eyes:  Negative for double vision.  Respiratory:  Negative for cough, hemoptysis, sputum production, shortness of breath and wheezing.   Cardiovascular:  Negative for chest pain, palpitations, orthopnea and leg swelling.  Gastrointestinal:  Negative for abdominal pain, blood in stool, constipation, diarrhea, heartburn, melena, nausea and vomiting.  Genitourinary:  Negative for dysuria, frequency and urgency.  Musculoskeletal:  Negative for back pain and joint pain.  Skin: Negative.  Negative for itching and rash.  Neurological:  Negative for dizziness, tingling, focal weakness, weakness and headaches.  Endo/Heme/Allergies:  Does not bruise/bleed easily.  Psychiatric/Behavioral:  Negative for depression. The patient  is not nervous/anxious and does not have insomnia.       PAST MEDICAL HISTORY :  Past Medical History:  Diagnosis Date   Anemia    Anxiety    Arthritis    fingers   Cancer (HCC)    basal cell, COLON   Hypertension    PONV (postoperative nausea and vomiting)    after hysterectomy   Pre-diabetes     Sleep apnea    CPAP    PAST SURGICAL HISTORY :   Past Surgical History:  Procedure Laterality Date   ABDOMINAL HYSTERECTOMY  1996   PARTIAL   BREAST CYST EXCISION Right 1976   cyst removed-neg   CATARACT EXTRACTION Bilateral    COLONOSCOPY WITH PROPOFOL N/A 03/02/2017   Procedure: COLONOSCOPY WITH PROPOFOL;  Surgeon: Midge Minium, MD;  Location: [facility];  Service: Endoscopy;  Laterality: N/A;   COLONOSCOPY WITH PROPOFOL N/A 06/13/2022   Procedure: COLONOSCOPY WITH PROPOFOL WITH BIOPSY;  Surgeon: Midge Minium, MD;  Location: [facility];  Service: Endoscopy;  Laterality: N/A;  SPOT USED TO MARK ASCENDING COLON POLYP   COLONOSCOPY WITH PROPOFOL N/A 06/15/2023   Procedure: COLONOSCOPY WITH PROPOFOL;  Surgeon: Midge Minium, MD;  Location: [facility] END[facility]COPY;  Service: Endoscopy;  Laterality: N/A;   ESOPHAGOGASTRODUODENOSCOPY (EGD) WITH PROPOFOL N/A 06/13/2022   Procedure: ESOPHAGOGASTRODUODENOSCOPY (EGD) WITH PROPOFOL;  Surgeon: Midge Minium, MD;  Location: [facility];  Service: Endoscopy;  Laterality: N/A;  Sleep apnea   LAPAROSCOPIC RIGHT COLECTOMY N/A 07/05/2022   Procedure: LAPAROSCOPIC RIGHT COLECTOMY hand assisted, RNFA to assist;  Surgeon: Leafy Ro, MD;  Location: [facility] ORS;  Service: General;  Laterality: N/A;   POLYPECTOMY N/A 03/02/2017   Procedure: POLYPECTOMY;  Surgeon: Midge Minium, MD;  Location: [facility];  Service: Endoscopy;  Laterality: N/A;    FAMILY HISTORY :   Family History  Problem Relation Age of Onset   Hypertension Mother    Heart disease Mother    Diabetes Mother    Diabetes Father    Prostate cancer Father    Breast cancer Sister 65   Graves' disease Sister    Breast cancer Sister     SOCIAL HISTORY:   Social History   Tobacco Use   Smoking status: Never   Smokeless tobacco: Never  Vaping Use   Vaping status: Never Used  Substance Use Topics   Alcohol use: Yes    Alcohol/week: 3.0 standard drinks of  alcohol    Types: 3 Glasses of wine per week   Drug use: No    ALLERGIES:  is allergic to silver nitrate.  MEDICATIONS:  Current Outpatient Medications  Medication Sig Dispense Refill   aspirin 81 MG tablet Take by mouth every other day.      atorvastatin (LIPITOR) 20 MG tablet TAKE 1 TABLET EVERY DAY 90 tablet 3   Biotin 5 MG CAPS daily.      cholecalciferol (VITAMIN D) 1000 units tablet Take 1,000 Units by mouth daily.      FLUoxetine (PROZAC) 10 MG capsule TAKE 1 CAPSULE EVERY DAY 90 capsule 3   latanoprost (XALATAN) 0.005 % ophthalmic solution 1 drop at bedtime.     losartan-hydrochlorothiazide (HYZAAR) 100-12.5 MG tablet Take 1 tablet by mouth daily. 90 tablet 3   MULTIPLE VITAMIN PO Take by mouth daily.      Omega-3 Fatty Acids (FISH OIL) 1000 MG CAPS daily.      psyllium (METAMUCIL) 58.6 % packet Take 1 packet by mouth daily.  No current facility-administered medications for this visit.    PHYSICAL EXAMINATION:   BP (!) 145/76 (BP Location: Left Arm, Patient Position: Sitting, Cuff Size: Normal)   Pulse 79   Temp (!) 97 F (36.1 C) (Tympanic)   Ht 5\' 6"  (1.676 m)   Wt 180 lb (81.6 kg)   SpO2 100%   BMI 29.05 kg/m   Filed Weights   08/22/23 1027  Weight: 180 lb (81.6 kg)     Physical Exam Vitals and nursing note reviewed.  HENT:     Head: Normocephalic and atraumatic.     Mouth/Throat:     Pharynx: Oropharynx is clear.  Eyes:     Extraocular Movements: Extraocular movements intact.     Pupils: Pupils are equal, round, and reactive to light.  Cardiovascular:     Rate and Rhythm: Normal rate and regular rhythm.  Pulmonary:     Comments: Decreased breath sounds bilaterally.  Abdominal:     Palpations: Abdomen is soft.  Musculoskeletal:        General: Normal range of motion.     Cervical back: Normal range of motion.  Skin:    General: Skin is warm.  Neurological:     General: No focal deficit present.     Mental Status: She is alert and oriented  to person, place, and time.  Psychiatric:        Behavior: Behavior normal.        Judgment: Judgment normal.        LABORATORY DATA:  I have reviewed the data as listed    Component Value Date/Time   NA 134 (L) 08/22/2023 1015   NA 137 04/13/2023 1032   NA 140 08/07/2012 1023   K 4.0 08/22/2023 1015   K 4.0 08/07/2012 1023   CL 100 08/22/2023 1015   CL 106 08/07/2012 1023   CO2 24 08/22/2023 1015   CO2 28 08/07/2012 1023   GLUCOSE 113 (H) 08/22/2023 1015   GLUCOSE 99 08/07/2012 1023   BUN 20 08/22/2023 1015   BUN 18 04/13/2023 1032   BUN 14 08/07/2012 1023   CREATININE 0.95 08/22/2023 1015   CREATININE 0.98 08/07/2012 1023   CALCIUM 9.3 08/22/2023 1015   CALCIUM 9.2 08/07/2012 1023   PROT 7.4 08/22/2023 1015   PROT 7.1 04/13/2023 1032   PROT 7.7 08/07/2012 1023   ALBUMIN 4.1 08/22/2023 1015   ALBUMIN 4.5 04/13/2023 1032   ALBUMIN 4.1 08/07/2012 1023   AST 37 08/22/2023 1015   ALT 45 (H) 08/22/2023 1015   ALT 30 08/07/2012 1023   ALKPHOS 50 08/22/2023 1015   ALKPHOS 63 08/07/2012 1023   BILITOT 1.0 08/22/2023 1015   GFRNONAA >60 08/22/2023 1015   GFRNONAA >60 08/07/2012 1023   GFRAA 64 04/10/2020 0933   GFRAA >60 08/07/2012 1023    No results found for: "SPEP", "UPEP"  Lab Results  Component Value Date   WBC 6.8 08/22/2023   NEUTROABS 3.7 08/22/2023   HGB 13.0 08/22/2023   HCT 37.6 08/22/2023   MCV 94.0 08/22/2023   PLT 220 08/22/2023      Chemistry      Component Value Date/Time   NA 134 (L) 08/22/2023 1015   NA 137 04/13/2023 1032   NA 140 08/07/2012 1023   K 4.0 08/22/2023 1015   K 4.0 08/07/2012 1023   CL 100 08/22/2023 1015   CL 106 08/07/2012 1023   CO2 24 08/22/2023 1015   CO2 28 08/07/2012 1023  BUN 20 08/22/2023 1015   BUN 18 04/13/2023 1032   BUN 14 08/07/2012 1023   CREATININE 0.95 08/22/2023 1015   CREATININE 0.98 08/07/2012 1023   GLU 91 11/13/2014 0000      Component Value Date/Time   CALCIUM 9.3 08/22/2023 1015    CALCIUM 9.2 08/07/2012 1023   ALKPHOS 50 08/22/2023 1015   ALKPHOS 63 08/07/2012 1023   AST 37 08/22/2023 1015   ALT 45 (H) 08/22/2023 1015   ALT 30 08/07/2012 1023   BILITOT 1.0 08/22/2023 1015       RADIOGRAPHIC STUDIES: I have personally reviewed the radiological images as listed and agreed with the findings in the report. No results found.   ASSESSMENT & PLAN:  Cancer of right colon (HCC) Right-sided colon cancer -stage II: 2pT3N0- [low risk]-moderately differentiated; more than 30 lymph nodes; no LVI; or perineural invasion; no perforation noted. NOV prior to surgery- CEA- 4.3. Tumor MSI stable. NO adjuvant chemotherapy.  # Continue surveillance with signatera. Every 3 months for now/ and per protocol. NOV 2024- s/p colonoscopy [Dr.Wohl]; awaiting again in 3 years.   # Re: Fatty liver:  Discussed importance of healthy weight/and weight loss.  Strongly recommend eating more green leafy vegetables and cutting down processed food/ carbohydrates.   # DISPOSITION: # signetera testing per protocol.  # follow up in 1 year-MD;  labs- cbc/cmp;cea- Dr.B   Orders Placed This Encounter  Procedures   CBC with Differential (Cancer Center Only)    Standing Status:   Future    Expected Date:   08/21/2024    Expiration Date:   08/21/2024   CMP (Cancer Center only)    Standing Status:   Future    Expected Date:   08/21/2024    Expiration Date:   08/21/2024   CEA    Standing Status:   Future    Expected Date:   08/21/2024    Expiration Date:   08/21/2024   Signatera    Not on immunotherapy.  Do not Delete Below This Line   ==========Department Information========== ID: [id number] Department: [facility] - A DEPT OF [facility] [address] [place] [address] Dept: [phone] Dept Fax: [phone]    Standing Status:   Future    Expected Date:   11/20/2023    Expiration Date:   08/21/2024    Avelina Laine  to follow up with patient for sample collection (mobile phleb, lab, or saliva)::   No    Surveillance Program draw frequency::   Every 3 Months    Surveillance Program draw count::   4    Cancer type::   Colon    Stage of diagnosis::   II    History of recurrence?:   No    Date of surgery (MM/DD/YYYY)::   07/05/2022    Is the patient receiving or planning to receive immunotherapy?:   No    Patient status::   Outpatient    Most recent progress/clinical note attached?:   Yes    Pathology report attached?:   Yes    Tissue collection date (MM/DD/YYYY)::   07/05/2022    By placing this electronic order I confirm the testing ordered herein is medically necessary and this patient has been informed of the details of the genetic test(s) ordered, including the risks, benefits, and alternatives, and has consented to testing.:   Yes    What type of billing?:   Bill  Insurance   All questions were answered. The patient knows to call the clinic with any problems, questions or concerns.      Earna Coder, MD 08/22/2023 11:22 AM

## 2023-08-22 NOTE — Assessment & Plan Note (Addendum)
Right-sided colon cancer -stage II: 2pT3N0- [low risk]-moderately differentiated; more than 30 lymph nodes; no LVI; or perineural invasion; no perforation noted. NOV prior to surgery- CEA- 4.3. Tumor MSI stable. NO adjuvant chemotherapy.  # Continue surveillance with signatera. Every 3 months for now/ and per protocol. NOV 2024- s/p colonoscopy [Dr.Wohl]; awaiting again in 3 years.   # Re: Fatty liver:  Discussed importance of healthy weight/and weight loss.  Strongly recommend eating more green leafy vegetables and cutting down processed food/ carbohydrates.   # DISPOSITION: # signetera testing per protocol.  # follow up in 1 year-MD;  labs- cbc/cmp;cea- Dr.B

## 2023-08-22 NOTE — Progress Notes (Signed)
How long will she have to continue the signatara labs?

## 2023-08-23 DIAGNOSIS — L720 Epidermal cyst: Secondary | ICD-10-CM | POA: Diagnosis not present

## 2023-08-23 DIAGNOSIS — L538 Other specified erythematous conditions: Secondary | ICD-10-CM | POA: Diagnosis not present

## 2023-08-23 DIAGNOSIS — L728 Other follicular cysts of the skin and subcutaneous tissue: Secondary | ICD-10-CM | POA: Diagnosis not present

## 2023-08-23 LAB — CEA: CEA: 3.3 ng/mL (ref 0.0–4.7)

## 2023-08-28 LAB — SIGNATERA
SIGNATERA MTM READOUT: 0 MTM/ml
SIGNATERA TEST RESULT: NEGATIVE

## 2023-10-11 ENCOUNTER — Ambulatory Visit: Payer: Medicare HMO | Admitting: Family Medicine

## 2023-10-11 ENCOUNTER — Encounter: Payer: Self-pay | Admitting: Family Medicine

## 2023-10-11 VITALS — BP 132/74 | HR 78 | Ht 66.0 in | Wt 179.4 lb

## 2023-10-11 DIAGNOSIS — I10 Essential (primary) hypertension: Secondary | ICD-10-CM | POA: Diagnosis not present

## 2023-10-11 DIAGNOSIS — E7800 Pure hypercholesterolemia, unspecified: Secondary | ICD-10-CM

## 2023-10-11 DIAGNOSIS — E782 Mixed hyperlipidemia: Secondary | ICD-10-CM

## 2023-10-11 DIAGNOSIS — R7303 Prediabetes: Secondary | ICD-10-CM

## 2023-10-11 MED ORDER — ATORVASTATIN CALCIUM 20 MG PO TABS: 20.0000 mg | ORAL_TABLET | Freq: Every day | ORAL | 1 refills | Status: DC

## 2023-10-11 NOTE — Progress Notes (Signed)
 Established patient visit   Patient: Cindy Vega   DOB: 1946/02/13   78 y.o. Female  MRN: [id number] Visit Date: 10/11/2023  Today's healthcare provider: Sherlyn Hay, DO   Chief Complaint  Patient presents with   Medical Management of Chronic Issues    6 month follow-up   Hypertension   Hyperlipidemia   Pre-Diabetes   Subjective    HPI  Cindy Vega is a 78 year old female who presents for a follow-up visit and blood work.  She consumed a small piece of cinnamon raisin toast and a cup of coffee this morning, which may affect her blood work results. She plans to return for fasting blood work at a later time.  She is no longer taking fluoxetine, which was initially used for hot flashes. Her current hot flashes are infrequent and different, described as sudden episodes of feeling hot. She sleeps through the night without needing to get up to use the bathroom.  She is not currently monitoring her blood sugar or blood pressure at home. Her diet is described as good, with an emphasis on fresh fruits and vegetables, and she tries to avoid processed foods. Due to her husband's diabetes, she limits sugary foods, although she occasionally indulges in dark chocolate.  She declined the COVID booster during her last visit, stating that she never contracted COVID and has received two shots and one booster previously. She is overdue for a tetanus vaccine, last received in 2012.      Medications: Outpatient Medications Prior to Visit  Medication Sig   aspirin 81 MG tablet Take by mouth every other day.    Biotin 5 MG CAPS daily.    cholecalciferol (VITAMIN D) 1000 units tablet Take 1,000 Units by mouth daily.    latanoprost (XALATAN) 0.005 % ophthalmic solution 1 drop at bedtime.   losartan-hydrochlorothiazide (HYZAAR) 100-12.5 MG tablet Take 1 tablet by mouth daily.   MULTIPLE VITAMIN PO Take by mouth daily.    Omega-3 Fatty Acids (FISH OIL) 1000 MG CAPS daily.     [DISCONTINUED] atorvastatin (LIPITOR) 20 MG tablet TAKE 1 TABLET EVERY DAY   [DISCONTINUED] FLUoxetine (PROZAC) 10 MG capsule TAKE 1 CAPSULE EVERY DAY (Patient not taking: Reported on 10/11/2023)   [DISCONTINUED] psyllium (METAMUCIL) 58.6 % packet Take 1 packet by mouth daily. (Patient not taking: Reported on 10/11/2023)   No facility-administered medications prior to visit.    Review of Systems  Constitutional:  Negative for appetite change, chills, fatigue and fever.  Respiratory:  Negative for chest tightness and shortness of breath.   Cardiovascular:  Negative for chest pain and palpitations.  Gastrointestinal:  Negative for abdominal pain, nausea and vomiting.  Neurological:  Negative for dizziness and weakness.        Objective    BP 132/74 (BP Location: Left Arm, Patient Position: Sitting, Cuff Size: Large)   Pulse 78   Ht 5\' 6"  (1.676 m)   Wt 179 lb 6.4 oz (81.4 kg)   SpO2 100%   BMI 28.96 kg/m     Physical Exam Vitals and nursing note reviewed.  Constitutional:      General: She is not in acute distress.    Appearance: Normal appearance.  HENT:     Head: Normocephalic and atraumatic.  Eyes:     General: No scleral icterus.    Conjunctiva/sclera: Conjunctivae normal.  Cardiovascular:     Rate and Rhythm: Normal rate.  Pulmonary:     Effort: Pulmonary  effort is normal.  Neurological:     Mental Status: She is alert and oriented to person, place, and time. Mental status is at baseline.  Psychiatric:        Mood and Affect: Mood normal.        Behavior: Behavior normal.      Results for orders placed or performed in visit on 10/11/23  Lipid Panel With LDL/HDL Ratio  Result Value Ref Range   Cholesterol, Total 168 100 - 199 mg/dL   Triglycerides 409 0 - 149 mg/dL   HDL 78 >81 mg/dL   VLDL Cholesterol Cal 24 5 - 40 mg/dL   LDL Chol Calc (NIH) 66 0 - 99 mg/dL   LDL/HDL Ratio 0.8 0.0 - 3.2 ratio  Comprehensive metabolic panel  Result Value Ref Range    Glucose 101 (H) 70 - 99 mg/dL   BUN 15 8 - 27 mg/dL   Creatinine, Ser 1.91 (H) 0.57 - 1.00 mg/dL   eGFR 52 (L) >47 WG/NFA/2.13   BUN/Creatinine Ratio 14 12 - 28   Sodium 139 134 - 144 mmol/L   Potassium 4.3 3.5 - 5.2 mmol/L   Chloride 100 96 - 106 mmol/L   CO2 21 20 - 29 mmol/L   Calcium 9.8 8.7 - 10.3 mg/dL   Total Protein 6.8 6.0 - 8.5 g/dL   Albumin 4.4 3.8 - 4.8 g/dL   Globulin, Total 2.4 1.5 - 4.5 g/dL   Bilirubin Total 0.4 0.0 - 1.2 mg/dL   Alkaline Phosphatase 61 44 - 121 IU/L   AST 36 0 - 40 IU/L   ALT 52 (H) 0 - 32 IU/L  Hemoglobin A1c  Result Value Ref Range   Hgb A1c MFr Bld 6.0 (H) 4.8 - 5.6 %   Est. average glucose Bld gHb Est-mCnc 126 mg/dL    Assessment & Plan    Essential hypertension -     Comprehensive metabolic panel with GFR  Prediabetes -     Hemoglobin A1c  Elevated cholesterol with elevated triglycerides -     Lipid Panel With LDL/HDL Ratio  Hypercholesterolemia -     Atorvastatin Calcium; Take 1 tablet (20 mg total) by mouth daily.  Dispense: 90 tablet; Refill: 1    Hypertension Blood pressure is elevated today without symptoms such as chest pain, shortness of breath, or vision changes. Prefers conservative treatment in older patients to avoid fall risk from hypotension. - Monitor blood pressure regularly - Encourage lifestyle modifications including a diet rich in fresh fruits and vegetables and low in processed foods  Hyperlipidemia Currently on atorvastatin for cholesterol management. Requires atorvastatin refill. - Send atorvastatin refill to pharmacy  General Health Maintenance Declined COVID booster after receiving two initial shots and one booster. Tetanus vaccine overdue since 2022. - Advise to get tetanus vaccine at the pharmacy - Schedule annual physical in September   Return in about 6 months (around 04/12/2024) for CPE.      I discussed the assessment and treatment plan with the patient  The patient was provided an opportunity  to ask questions and all were answered. The patient agreed with the plan and demonstrated an understanding of the instructions.   The patient was advised to call back or seek an in-person evaluation if the symptoms worsen or if the condition fails to improve as anticipated.    Sherlyn Hay, DO  [facility] [phone] (phone) [phone] (fax)  [facility]

## 2023-10-11 NOTE — Patient Instructions (Addendum)
 Recommend vaccines:  Tdap

## 2023-10-13 LAB — COMPREHENSIVE METABOLIC PANEL
ALT: 52 IU/L — ABNORMAL HIGH (ref 0–32)
AST: 36 IU/L (ref 0–40)
Albumin: 4.4 g/dL (ref 3.8–4.8)
Alkaline Phosphatase: 61 IU/L (ref 44–121)
BUN/Creatinine Ratio: 14 (ref 12–28)
BUN: 15 mg/dL (ref 8–27)
Bilirubin Total: 0.4 mg/dL (ref 0.0–1.2)
CO2: 21 mmol/L (ref 20–29)
Calcium: 9.8 mg/dL (ref 8.7–10.3)
Chloride: 100 mmol/L (ref 96–106)
Creatinine, Ser: 1.11 mg/dL — ABNORMAL HIGH (ref 0.57–1.00)
Globulin, Total: 2.4 g/dL (ref 1.5–4.5)
Glucose: 101 mg/dL — ABNORMAL HIGH (ref 70–99)
Potassium: 4.3 mmol/L (ref 3.5–5.2)
Sodium: 139 mmol/L (ref 134–144)
Total Protein: 6.8 g/dL (ref 6.0–8.5)
eGFR: 52 mL/min/{1.73_m2} — ABNORMAL LOW (ref >59)

## 2023-10-13 LAB — LIPID PANEL WITH LDL/HDL RATIO
Cholesterol, Total: 168 mg/dL (ref 100–199)
HDL: 78 mg/dL (ref >39)
LDL Chol Calc (NIH): 66 mg/dL (ref 0–99)
LDL/HDL Ratio: 0.8 ratio (ref 0.0–3.2)
Triglycerides: 142 mg/dL (ref 0–149)
VLDL Cholesterol Cal: 24 mg/dL (ref 5–40)

## 2023-10-13 LAB — HEMOGLOBIN A1C
Est. average glucose Bld gHb Est-mCnc: 126 mg/dL
Hgb A1c MFr Bld: 6 % — ABNORMAL HIGH (ref 4.8–5.6)

## 2023-10-17 ENCOUNTER — Other Ambulatory Visit: Payer: Self-pay | Admitting: Family Medicine

## 2023-10-17 ENCOUNTER — Ambulatory Visit: Payer: Self-pay

## 2023-10-17 ENCOUNTER — Telehealth: Payer: Self-pay

## 2023-10-17 NOTE — Telephone Encounter (Signed)
 Chief Complaint: Gout flare up Symptoms: Left big toe is painful, swollen, red Frequency: since yesterday Pertinent Negatives: Patient denies n/a Disposition: [] ED /[] Urgent Care (no appt availability in office) / [x] Appointment(In office/virtual)/ []  [facility]/ [] Home Care/ [x] Refused Recommended Disposition /[] [facility]/ [x]  Follow-up with PCP Additional Notes: Patient called in to request medication Cholchicine be called in for a gout flare up she is experiencing in her left big toe. Patient states she was prescribed this before back in October of 2023 and has used it as needed, but it has expired and is not working on this current flare up. Patient states her pain is a 9/10 pain and she is having difficulty walking, so she does not want to come in. Patient would like to know if this medication can be called in to her current pharmacy listed as [facility]. Patient is asking for a call back regardless to let her know if medication will be called in or if she needs to be seen in office.     Copied from CRM [id number]. Topic: Clinical - Medication Refill >> Oct 17, 2023 12:02 PM Tiffany S wrote: Most Recent Primary Care Visit:  Provider: Sherlyn Hay  Department: [facility]  Visit Type: OFFICE VISIT  Date: 10/11/2023  Medication: Colchicine.6mg   Has the patient contacted their pharmacy? Yes (Agent: If no, request that the patient contact the pharmacy for the refill. If patient does not wish to contact the pharmacy document the reason why and proceed with request.) (Agent: If yes, when and what did the pharmacy advise?)  Is this the correct pharmacy for this prescription? Yes If no, delete pharmacy and type the correct one.  This is the patient's preferred pharmacy:  [facility] - [place], [place] - [address] [place] [address] Phone: [phone] Fax: [phone]  [facility] - [place],  [place] - [address] [place] [address] Phone: [phone] Fax: [phone]   Has the prescription been filled recently? No  Is the patient out of the medication? Yes  Has the patient been seen for an appointment in the last year OR does the patient have an upcoming appointment? Yes  Can we respond through MyChart? No (patient would like a phone call)  Agent: Please be advised that Rx refills may take up to 3 business days. We ask that you follow-up with your pharmacy. Reason for Disposition  [1] SEVERE pain (e.g., excruciating, unable to do any normal activities) AND [2] not improved after 2 hours of pain medicine    Patient does not want to come into office - see notes  Answer Assessment - Initial Assessment Questions 1. ONSET: "When did the pain start?"      Yesterday 2. LOCATION: "Where is the pain located?"   (e.g., around nail, entire toe, at foot joint)      Left big toe 3. PAIN: "How bad is the pain?"    (Scale 1-10; or mild, moderate, severe)   -  MILD (1-3): doesn't interfere with normal activities    -  MODERATE (4-7): interferes with normal activities (e.g., work or school) or awakens from sleep, limping    -  SEVERE (8-10): excruciating pain, unable to do any normal activities, unable to walk     9 4. APPEARANCE: "What does the toe look like?" (e.g., redness, swelling, bruising, pallor)     Swollen, red  5. CAUSE: "What do you think is causing the toe pain?"     Gout - patient has had prior flare up 6. OTHER SYMPTOMS: "Do you have any other symptoms?" (e.g., leg pain, rash, fever, numbness)     Swelling in left big toe  Protocols used: Toe Pain-A-AH

## 2023-10-17 NOTE — Telephone Encounter (Signed)
 Copied from CRM [id number]. Topic: Clinical - Medication Refill >> Oct 17, 2023 12:02 PM Tiffany S wrote: Most Recent Primary Care Visit:  Provider: Sherlyn Hay  Department: [facility]  Visit Type: OFFICE VISIT  Date: 10/11/2023  Medication: Colchicine.6mg   Has the patient contacted their pharmacy? Yes (Agent: If no, request that the patient contact the pharmacy for the refill. If patient does not wish to contact the pharmacy document the reason why and proceed with request.) (Agent: If yes, when and what did the pharmacy advise?)  Is this the correct pharmacy for this prescription? Yes If no, delete pharmacy and type the correct one.  This is the patient's preferred pharmacy:  [facility] - [place], [place] - [address] [place] [address] Phone: [phone] Fax: [phone]  [facility] - [place], [place] - [address] [place] [address] Phone: [phone] Fax: [phone]   Has the prescription been filled recently? No  Is the patient out of the medication? Yes  Has the patient been seen for an appointment in the last year OR does the patient have an upcoming appointment? Yes  Can we respond through MyChart? No (patient would like a phone call)  Agent: Please be advised that Rx refills may take up to 3 business days. We ask that you follow-up with your pharmacy. >> Oct 17, 2023  4:11 PM Victorino Dike T wrote: Still waiting for medication to be called in, need it called in before pharmacy closes at 5:30p

## 2023-10-18 ENCOUNTER — Ambulatory Visit
Admission: EM | Admit: 2023-10-18 | Discharge: 2023-10-18 | Disposition: A | Discharge status: Home / Self Care | Attending: Emergency Medicine | Admitting: Emergency Medicine

## 2023-10-18 ENCOUNTER — Other Ambulatory Visit: Payer: Self-pay | Admitting: Family Medicine

## 2023-10-18 DIAGNOSIS — M109 Gout, unspecified: Secondary | ICD-10-CM

## 2023-10-18 MED ORDER — METHYLPREDNISOLONE 4 MG PO TBPK: ORAL_TABLET | ORAL | 0 refills | Status: DC

## 2023-10-18 MED ORDER — DEXAMETHASONE SODIUM PHOSPHATE 10 MG/ML IJ SOLN
10.0000 mg | Freq: Once | INTRAMUSCULAR | Status: AC
  Administered 2023-10-18: 10 mg via INTRAMUSCULAR

## 2023-10-18 NOTE — Discharge Instructions (Addendum)
 Take the prednisone according to the package instructions to help decrease inflammation and help with pain.  Follow the low purine diet guide given your discharge paperwork.  This is a list of foods that could be contributing to your gout attacks.  Red meat, alcohol, poor water intake, and leafy green vegetables are the most common contributors to gout flares.  Increase your oral fluid intake of water to at least 128 ounces to help your body flush the uric acid from your system.  If your pain does not improve either return for reevaluation or follow-up with your primary care provider.

## 2023-10-18 NOTE — ED Provider Notes (Signed)
 [facility]    CSN: [id number] Arrival date & time: 10/18/23  1229      History   Chief Complaint Chief Complaint  Patient presents with   Gout    HPI Cindy Vega is a 78 y.o. female.   HPI  78 year old female with past medical history significant for basal cell carcinoma of her colon, arthritis, anxiety, anemia, hypertension, prediabetes, sleep apnea, and gout presents for evaluation of gout flare in her left big toe.  She reports that the pain started 3 days ago.  She did take her colchicine as prescribed without any improvement of symptoms.  She is concerned because the medication states that it is expired in October of last year.  She is also taken Tylenol and used tart cherry without improvement of her symptoms.  Past Medical History:  Diagnosis Date   Anemia    Anxiety    Arthritis    fingers   Cancer (HCC)    basal cell, COLON   Hypertension    PONV (postoperative nausea and vomiting)    after hysterectomy   Pre-diabetes    Sleep apnea    CPAP    Patient Active Problem List   Diagnosis Date Noted   Personal history of colon cancer 06/15/2023   Aplastic anemia (HCC) 04/13/2023   Severe sleep apnea 08/04/2022   OSA on CPAP 08/04/2022   Colonic mass 07/05/2022   Cancer of right colon (HCC) 07/05/2022   Neoplasm of digestive system 06/13/2022   Iron deficiency anemia due to chronic blood loss 06/01/2022   Gout 05/16/2022   Annual physical exam 04/11/2022   Prediabetes 04/11/2022   Screening for colon cancer 04/11/2022   Need for influenza vaccination 04/11/2022   Wrist pain, acute, right 04/01/2021   Essential hypertension 04/01/2021   Visit for preventive health examination 04/01/2021   Breast cancer screening by mammogram 04/01/2021   Encounter for screening for osteoporosis 04/01/2021   Alcohol abuse 04/01/2021   Special screening for malignant neoplasms, colon    Benign neoplasm of ascending colon    Benign neoplasm of descending  colon    Anxiety 10/02/2015   Abnormal liver enzymes 10/02/2015   Hypercholesteremia 10/02/2015   Blood glucose elevated 10/02/2015   Arthritis, degenerative 10/02/2015   Insomnia 09/02/2015   Family history of breast cancer in first degree relative 01/05/2012   Bloodgood disease 01/05/2012   Personal history of surgery to heart and great vessels, presenting hazards to health 01/05/2012   H/O total hysterectomy 01/05/2012   Glaucoma 11/22/2007   Hay fever 06/15/2004    Past Surgical History:  Procedure Laterality Date   ABDOMINAL HYSTERECTOMY  1996   PARTIAL   BREAST CYST EXCISION Right 1976   cyst removed-neg   CATARACT EXTRACTION Bilateral    COLONOSCOPY WITH PROPOFOL N/A 03/02/2017   Procedure: COLONOSCOPY WITH PROPOFOL;  Surgeon: Midge Minium, MD;  Location: [facility];  Service: Endoscopy;  Laterality: N/A;   COLONOSCOPY WITH PROPOFOL N/A 06/13/2022   Procedure: COLONOSCOPY WITH PROPOFOL WITH BIOPSY;  Surgeon: Midge Minium, MD;  Location: [facility];  Service: Endoscopy;  Laterality: N/A;  SPOT USED TO MARK ASCENDING COLON POLYP   COLONOSCOPY WITH PROPOFOL N/A 06/15/2023   Procedure: COLONOSCOPY WITH PROPOFOL;  Surgeon: Midge Minium, MD;  Location: [facility];  Service: Endoscopy;  Laterality: N/A;   ESOPHAGOGASTRODUODENOSCOPY (EGD) WITH PROPOFOL N/A 06/13/2022   Procedure: ESOPHAGOGASTRODUODENOSCOPY (EGD) WITH PROPOFOL;  Surgeon: Midge Minium, MD;  Location: [facility];  Service: Endoscopy;  Laterality: N/A;  Sleep apnea   LAPAROSCOPIC RIGHT COLECTOMY N/A 07/05/2022   Procedure: LAPAROSCOPIC RIGHT COLECTOMY hand assisted, RNFA to assist;  Surgeon: Leafy Ro, MD;  Location: [facility];  Service: General;  Laterality: N/A;   POLYPECTOMY N/A 03/02/2017   Procedure: POLYPECTOMY;  Surgeon: Midge Minium, MD;  Location: [facility];  Service: Endoscopy;  Laterality: N/A;    OB History     Gravida  2   Para  2   Term      Preterm       AB      Living         SAB      IAB      Ectopic      Multiple      Live Births               Home Medications    Prior to Admission medications   Medication Sig Start Date End Date Taking? Authorizing Provider  aspirin 81 MG tablet Take by mouth every other day.  11/15/07  Yes [provider]  atorvastatin (LIPITOR) 20 MG tablet Take 1 tablet (20 mg total) by mouth daily. 10/11/23  Yes Pardue, Monico Blitz, DO  Biotin 5 MG CAPS daily.  01/19/09  Yes [provider]  cholecalciferol (VITAMIN D) 1000 units tablet Take 1,000 Units by mouth daily.  05/12/10  Yes [provider]  latanoprost (XALATAN) 0.005 % ophthalmic solution 1 drop at bedtime.   Yes [provider]  losartan-hydrochlorothiazide (HYZAAR) 100-12.5 MG tablet Take 1 tablet by mouth daily. 04/13/23  Yes Jacky Kindle, FNP  methylPREDNISolone (MEDROL DOSEPAK) 4 MG TBPK tablet Take according to the package insert. 10/18/23  Yes Becky Augusta, NP  MULTIPLE VITAMIN PO Take by mouth daily.  05/12/10  Yes [provider]  Omega-3 Fatty Acids (FISH OIL) 1000 MG CAPS daily.  11/22/07  Yes [provider]    Family History Family History  Problem Relation Age of Onset   Hypertension Mother    Heart disease Mother    Diabetes Mother    Diabetes Father    Prostate cancer Father    Breast cancer Sister 54   Graves' disease Sister    Breast cancer Sister     Social History Social History   Tobacco Use   Smoking status: Never   Smokeless tobacco: Never  Vaping Use   Vaping status: Never Used  Substance Use Topics   Alcohol use: Yes    Alcohol/week: 3.0 standard drinks of alcohol    Types: 3 Glasses of wine per week   Drug use: No     Allergies   Silver nitrate   Review of Systems Review of Systems  Musculoskeletal:  Positive for arthralgias and joint swelling.  Skin:  Positive for color change.     Physical Exam Triage Vital Signs ED Triage  Vitals  Encounter Vitals Group     BP      Systolic BP Percentile      Diastolic BP Percentile      Pulse      Resp      Temp      Temp src      SpO2      Weight      Height      Head Circumference      Peak Flow      Pain Score      Pain Loc  Pain Education      Exclude from Growth Chart    No data found.  Updated Vital Signs BP (!) 168/85 (BP Location: Right Arm)   Pulse 75   Temp 97.6 F (36.4 C) (Oral)   Resp 16   SpO2 98%   Visual Acuity Right Eye Distance:   Left Eye Distance:   Bilateral Distance:    Right Eye Near:   Left Eye Near:    Bilateral Near:     Physical Exam Vitals and nursing note reviewed.  Constitutional:      Appearance: Normal appearance. She is not ill-appearing.  Musculoskeletal:        General: Swelling and tenderness present. No signs of injury.  Skin:    General: Skin is warm and dry.     Capillary Refill: Capillary refill takes less than 2 seconds.     Findings: Erythema present.  Neurological:     General: No focal deficit present.     Mental Status: She is alert and oriented to person, place, and time.      UC Treatments / Results  Labs (all labs ordered are listed, but only abnormal results are displayed) Labs Reviewed - No data to display  EKG   Radiology No results found.  Procedures Procedures (including critical care time)  Medications Ordered in UC Medications  dexamethasone (DECADRON) injection 10 mg (has no administration in time range)    Initial Impression / Assessment and Plan / UC Course  I have reviewed the triage vital signs and the nursing notes.  Pertinent labs & imaging results that were available during my care of the patient were reviewed by me and considered in my medical decision making (see chart for details).   Patient is a very pleasant, nontoxic-appearing 79 old female presenting for a gout flare in her left big toe.  She has erythema and mild tenderness to the MTP joint of her  left great toe but states that the pain is more in the body of the great toe itself.  It does not increase with weightbearing.  She took colchicine without any improvement of symptoms.  She reports that she has never taken steroids before.  Given that she has taken colchicine, despite it being expired 6 months, it should have still worked for her.  I offered her the option of trying a low-dose steroid pack and received a injection of Decadron here in clinic and lieu of using colchicine.  She is in agreement with that plan.  I will order 10 mg of IM Decadron and have her start a Medrol Dosepak in the morning.  Return precautions reviewed.   Final Clinical Impressions(s) / UC Diagnoses   Final diagnoses:  Acute gout involving toe of left foot, unspecified cause     Discharge Instructions      Take the prednisone according to the package instructions to help decrease inflammation and help with pain.  Follow the low purine diet guide given your discharge paperwork.  This is a list of foods that could be contributing to your gout attacks.  Red meat, alcohol, poor water intake, and leafy green vegetables are the most common contributors to gout flares.  Increase your oral fluid intake of water to at least 128 ounces to help your body flush the uric acid from your system.  If your pain does not improve either return for reevaluation or follow-up with your primary care provider.      ED Prescriptions  Medication Sig Dispense Auth. Provider   methylPREDNISolone (MEDROL DOSEPAK) 4 MG TBPK tablet Take according to the package insert. 1 each Becky Augusta, NP      PDMP not reviewed this encounter.   Becky Augusta, NP 10/18/23 [id number]

## 2023-10-18 NOTE — ED Triage Notes (Addendum)
 Patient states that she's having a gout flare up since Monday in left big toe. Patient takes Colchicine but rx is expired.

## 2023-10-19 ENCOUNTER — Encounter: Payer: Self-pay | Admitting: Family Medicine

## 2023-10-27 ENCOUNTER — Encounter: Payer: Self-pay | Admitting: Family Medicine

## 2023-11-20 ENCOUNTER — Other Ambulatory Visit: Payer: Self-pay | Admitting: *Deleted

## 2023-11-20 ENCOUNTER — Other Ambulatory Visit: Payer: Self-pay

## 2023-11-20 ENCOUNTER — Inpatient Hospital Stay: Payer: Medicare HMO | Attending: Internal Medicine

## 2023-11-20 DIAGNOSIS — C182 Malignant neoplasm of ascending colon: Secondary | ICD-10-CM

## 2023-11-20 LAB — GENETIC SCREENING ORDER

## 2023-11-26 LAB — SIGNATERA
SIGNATERA MTM READOUT: 0 MTM/ml
SIGNATERA TEST RESULT: NEGATIVE

## 2023-11-27 ENCOUNTER — Encounter: Payer: Self-pay | Admitting: Internal Medicine

## 2023-11-29 ENCOUNTER — Encounter: Payer: Self-pay | Admitting: Internal Medicine

## 2023-11-30 ENCOUNTER — Other Ambulatory Visit: Payer: Self-pay | Admitting: Family Medicine

## 2023-11-30 NOTE — Telephone Encounter (Signed)
 [facility] is requesting refill colchicine  0.6 MG tablet   Please advise

## 2023-11-30 NOTE — Telephone Encounter (Signed)
 Please review. Patient was seen 10/11/2023. No Uric acid level was checked. Last checked 05/16/22

## 2023-12-04 ENCOUNTER — Telehealth: Payer: Self-pay | Admitting: Family Medicine

## 2023-12-04 MED ORDER — COLCHICINE 0.6 MG PO TABS: ORAL_TABLET | ORAL | 0 refills | Status: DC

## 2023-12-04 NOTE — Telephone Encounter (Signed)
 Rx sent today E-Prescribing Status: Receipt confirmed by pharmacy (12/04/2023  7:48 AM EDT)

## 2023-12-04 NOTE — Telephone Encounter (Signed)
 [facility] is requesting refill colchicine  0.6 MG tablet   Please advise

## 2023-12-08 ENCOUNTER — Telehealth: Payer: Self-pay | Admitting: Family Medicine

## 2023-12-08 ENCOUNTER — Other Ambulatory Visit: Payer: Self-pay

## 2023-12-08 DIAGNOSIS — M109 Gout, unspecified: Secondary | ICD-10-CM

## 2023-12-08 NOTE — Telephone Encounter (Signed)
Converted to refill request 

## 2023-12-08 NOTE — Telephone Encounter (Signed)
 Copied from CRM [id number]. Topic: Clinical - Medication Question >> Dec 08, 2023 10:29 AM Virgia Griffins wrote: Cindy Vega from [facility] , checking on RX status colchicine  0.6 MG tablet. Notified him rx refill has been received.

## 2023-12-08 NOTE — Telephone Encounter (Signed)
 [facility] is requesting refill colchicine  0.6 MG tablet   Please advise

## 2023-12-13 MED ORDER — COLCHICINE 0.6 MG PO TABS: ORAL_TABLET | ORAL | 0 refills | Status: DC

## 2023-12-13 NOTE — Addendum Note (Signed)
 Addended by: Judyann Number on: 12/13/2023 07:58 AM   Modules accepted: Orders

## 2024-01-01 ENCOUNTER — Other Ambulatory Visit: Payer: Self-pay | Admitting: Family Medicine

## 2024-01-01 DIAGNOSIS — M109 Gout, unspecified: Secondary | ICD-10-CM

## 2024-01-31 ENCOUNTER — Telehealth: Payer: Self-pay | Admitting: Family Medicine

## 2024-01-31 ENCOUNTER — Other Ambulatory Visit: Payer: Self-pay

## 2024-01-31 DIAGNOSIS — I10 Essential (primary) hypertension: Secondary | ICD-10-CM

## 2024-01-31 MED ORDER — LOSARTAN POTASSIUM-HCTZ 100-12.5 MG PO TABS: 1.0000 | ORAL_TABLET | Freq: Every day | ORAL | 3 refills | Status: AC

## 2024-01-31 NOTE — Telephone Encounter (Signed)
 [facility] faxed refill request for the following medications:    losartan -hydrochlorothiazide (HYZAAR) 100-12.5 MG tablet   Please advise

## 2024-01-31 NOTE — Telephone Encounter (Signed)
 MEDICATION SENT IN TO [facility]

## 2024-02-08 ENCOUNTER — Other Ambulatory Visit: Payer: Self-pay | Admitting: *Deleted

## 2024-02-08 DIAGNOSIS — C182 Malignant neoplasm of ascending colon: Secondary | ICD-10-CM

## 2024-02-19 ENCOUNTER — Inpatient Hospital Stay: Payer: Medicare HMO | Attending: Internal Medicine

## 2024-02-19 DIAGNOSIS — C182 Malignant neoplasm of ascending colon: Secondary | ICD-10-CM

## 2024-02-19 LAB — GENETIC SCREENING ORDER

## 2024-03-02 LAB — SIGNATERA
SIGNATERA MTM READOUT: 0 MTM/ml
SIGNATERA TEST RESULT: NEGATIVE

## 2024-03-16 ENCOUNTER — Other Ambulatory Visit: Payer: Self-pay | Admitting: Family Medicine

## 2024-03-16 DIAGNOSIS — E7800 Pure hypercholesterolemia, unspecified: Secondary | ICD-10-CM

## 2024-04-12 ENCOUNTER — Encounter: Payer: Self-pay | Admitting: Family Medicine

## 2024-04-12 ENCOUNTER — Ambulatory Visit: Admitting: Family Medicine

## 2024-04-12 VITALS — BP 138/73 | HR 83 | Temp 97.9°F | Ht 66.0 in | Wt 179.4 lb

## 2024-04-12 DIAGNOSIS — Z85038 Personal history of other malignant neoplasm of large intestine: Secondary | ICD-10-CM

## 2024-04-12 DIAGNOSIS — Z1231 Encounter for screening mammogram for malignant neoplasm of breast: Secondary | ICD-10-CM | POA: Diagnosis not present

## 2024-04-12 DIAGNOSIS — G4733 Obstructive sleep apnea (adult) (pediatric): Secondary | ICD-10-CM | POA: Diagnosis not present

## 2024-04-12 DIAGNOSIS — Z0001 Encounter for general adult medical examination with abnormal findings: Secondary | ICD-10-CM

## 2024-04-12 DIAGNOSIS — I10 Essential (primary) hypertension: Secondary | ICD-10-CM

## 2024-04-12 DIAGNOSIS — N1831 Chronic kidney disease, stage 3a: Secondary | ICD-10-CM | POA: Diagnosis not present

## 2024-04-12 DIAGNOSIS — E7800 Pure hypercholesterolemia, unspecified: Secondary | ICD-10-CM

## 2024-04-12 DIAGNOSIS — R7303 Prediabetes: Secondary | ICD-10-CM

## 2024-04-12 DIAGNOSIS — Z0000 Encounter for general adult medical examination without abnormal findings: Secondary | ICD-10-CM

## 2024-04-12 DIAGNOSIS — M158 Other polyosteoarthritis: Secondary | ICD-10-CM | POA: Diagnosis not present

## 2024-04-12 MED ORDER — ATORVASTATIN CALCIUM 20 MG PO TABS: 20.0000 mg | ORAL_TABLET | Freq: Every day | ORAL | 1 refills | Status: AC

## 2024-04-12 NOTE — Progress Notes (Signed)
 Complete physical exam   Patient: Cindy Vega   DOB: 1945-08-06   78 y.o. Female  MRN: [id number] Visit Date: 04/12/2024  Today's healthcare provider: LAURAINE LOISE BUOY, DO   Chief Complaint  Patient presents with   Annual Exam    Diet- General, tries to limit sugar and carbs Exercise- Once in a while, maybe 1 or 2 days a week Overall Feeling- Good Sleep- Good Concerns- None  Vaccines at drug store.   Subjective    [name] is a 78 y.o. female who presents today for a complete physical exam.   HPI HPI     Annual Exam    Additional comments: Diet- General, tries to limit sugar and carbs Exercise- Once in a while, maybe 1 or 2 days a week Overall Feeling- Good Sleep- Good Concerns- None  Vaccines at drug store.      Last edited by Terrel Powell CROME, CMA on 04/12/2024 10:03 AM.      [name] is a 78 year old female who presents for an annual physical exam.  She has osteoarthritis, primarily affecting her left hand, causing swelling and difficulty wearing rings. She is left-handed and is not currently taking any medication for it. The pain is described as an ache that is not significantly bothersome.  She has a history of colon cancer, diagnosed during a colonoscopy in 2020, which led to the surgical removal of twelve inches of her colon. She has been feeling well since the surgery and undergoes regular blood marker tests called Signatera every three months, which have been normal. There is a possibility of extending these tests to every six months.  She uses a CPAP machine and is on blood pressure medication and atorvastatin  for cholesterol, both of which she tolerates well.  She has a history of cataract surgery and ocular hypertension, for which she uses latanoprost  eye drops. Her optic nerve has remained healthy with no changes in her condition for fifteen years.  She engages in weekly stretching exercises at a facility called [facility]' and finds it beneficial. She also attempts to do stretches at home.     Past Medical History:  Diagnosis Date   Anemia    Anxiety    Arthritis    fingers   Cancer (HCC)    basal cell, COLON   Cataract 2005   Hypertension    PONV (postoperative nausea and vomiting)    after hysterectomy   Pre-diabetes    Sleep apnea    CPAP   Past Surgical History:  Procedure Laterality Date   ABDOMINAL HYSTERECTOMY  1996   PARTIAL   BREAST CYST EXCISION Right 1976   cyst removed-neg   CATARACT EXTRACTION Bilateral    COLON SURGERY  2023   COLONOSCOPY WITH PROPOFOL  N/A 03/02/2017   Procedure: COLONOSCOPY WITH PROPOFOL ;  Surgeon: Jinny Carmine, MD;  Location: [facility];  Service: Endoscopy;  Laterality: N/A;   COLONOSCOPY WITH PROPOFOL  N/A 06/13/2022   Procedure: COLONOSCOPY WITH PROPOFOL  WITH BIOPSY;  Surgeon: Jinny Carmine, MD;  Location: [facility];  Service: Endoscopy;  Laterality: N/A;  SPOT USED TO MARK ASCENDING COLON POLYP   COLONOSCOPY WITH PROPOFOL  N/A 06/15/2023   Procedure: COLONOSCOPY WITH PROPOFOL ;  Surgeon: Jinny Carmine, MD;  Location: [facility];  Service: Endoscopy;  Laterality: N/A;   ESOPHAGOGASTRODUODENOSCOPY (EGD) WITH PROPOFOL  N/A 06/13/2022   Procedure: ESOPHAGOGASTRODUODENOSCOPY (EGD) WITH PROPOFOL ;  Surgeon: Jinny Carmine, MD;  Location: [facility];  Service: Endoscopy;  Laterality: N/A;  Sleep apnea   LAPAROSCOPIC RIGHT COLECTOMY N/A 07/05/2022   Procedure: LAPAROSCOPIC RIGHT COLECTOMY hand assisted, RNFA to assist;  Surgeon: Jordis Laneta FALCON, MD;  Location: [facility];  Service: General;  Laterality: N/A;   POLYPECTOMY N/A 03/02/2017   Procedure: POLYPECTOMY;  Surgeon: Jinny Carmine, MD;  Location: [facility];  Service: Endoscopy;  Laterality: N/A;   Social History   Socioeconomic History   Marital status: Married    Spouse name: [name]   Number of children: 2   Years of education: Not on file   Highest education  level: Bachelor's degree (e.g., BA, AB, BS)  Occupational History   Occupation: retired  Tobacco Use   Smoking status: Never   Smokeless tobacco: Never  Vaping Use   Vaping status: Never Used  Substance and Sexual Activity   Alcohol use: Yes    Alcohol/week: 3.0 standard drinks of alcohol    Types: 3 Glasses of wine per week   Drug use: No   Sexual activity: Not Currently  Other Topics Concern   Not on file  Social History Narrative   Not on file   Social Drivers of Health   Financial Resource Strain: Low Risk  (04/11/2024)   Overall Financial Resource Strain (CARDIA)    Difficulty of Paying Living Expenses: Not hard at all  Food Insecurity: No Food Insecurity (04/11/2024)   Hunger Vital Sign    Worried About Running Out of Food in the Last Year: Never true    Ran Out of Food in the Last Year: Never true  Transportation Needs: No Transportation Needs (04/11/2024)   PRAPARE - Administrator, Civil Service (Medical): No    Lack of Transportation (Non-Medical): No  Physical Activity: Insufficiently Active (04/11/2024)   Exercise Vital Sign    Days of Exercise per Week: 2 days    Minutes of Exercise per Session: 20 min  Stress: No Stress Concern Present (04/11/2024)   [employer] of Occupational Health - Occupational Stress Questionnaire    Feeling of Stress: Not at all  Social Connections: Socially Integrated (04/11/2024)   Social Connection and Isolation Panel    Frequency of Communication with Friends and Family: More than three times a week    Frequency of Social Gatherings with Friends and Family: More than three times a week    Attends Religious Services: More than 4 times per year    Active Member of [employer] or Organizations: Yes    Attends Banker Meetings: More than 4 times per year    Marital Status: Married  Catering manager Violence: Not At Risk (05/01/2023)   Humiliation, Afraid, Rape, and Kick questionnaire    Fear of Current or  Ex-Partner: No    Emotionally Abused: No    Physically Abused: No    Sexually Abused: No   Family Status  Relation Name Status   Mother Vernell Ned Alive   Father Lamar Ned Deceased at age 73   Sister Raoul T [place] Deceased   Sister  Alive   Sister  Alive   Daughter  Alive   Brother  Alive   Son  Alive  No partnership data on file   Family History  Problem Relation Age of Onset   Hypertension Mother    Heart disease Mother    Diabetes Mother    Arthritis Mother    Diabetes Father    Prostate cancer Father    Breast cancer Sister 31  Cancer Sister    Yvone' disease Sister    Breast cancer Sister    Allergies  Allergen Reactions   Silver  Nitrate     LIQUID NITRATE used to remove warts    Patient Care Team: Donzella Lauraine SAILOR, DO as PCP - General (Family Medicine) Myrna Adine Anes, MD as Consulting Physician (Ophthalmology) Isenstein, Arin L, MD (Dermatology) Maurie Rayfield BIRCH, RN as Oncology Nurse Navigator Rennie Cindy SAUNDERS, MD as Consulting Physician (Oncology)   Medications: Outpatient Medications Prior to Visit  Medication Sig   aspirin 81 MG tablet Take by mouth every other day.    Biotin 5 MG CAPS daily.    cholecalciferol (VITAMIN D ) 1000 units tablet Take 1,000 Units by mouth daily.    colchicine  0.6 MG tablet TAKE 2 TABLETS ON DAY 1, THEN 1 TABLET DAILY AS DIRECTED THEREAFTER UNTIL SYMPTOMS RESOLVE.   latanoprost  (XALATAN ) 0.005 % ophthalmic solution 1 drop at bedtime.   losartan -hydrochlorothiazide (HYZAAR) 100-12.5 MG tablet Take 1 tablet by mouth daily.   MULTIPLE VITAMIN PO Take by mouth daily.    Omega-3 Fatty Acids (FISH OIL) 1000 MG CAPS daily.    [DISCONTINUED] atorvastatin  (LIPITOR) 20 MG tablet Take 1 tablet (20 mg total) by mouth daily.   [DISCONTINUED] methylPREDNISolone  (MEDROL  DOSEPAK) 4 MG TBPK tablet Take according to the package insert.   No facility-administered medications prior to visit.    Review of Systems   Constitutional:  Negative for chills, fatigue and fever.  HENT:  Negative for congestion, ear pain, rhinorrhea, sneezing and sore throat.   Eyes: Negative.  Negative for pain and redness.  Respiratory:  Negative for cough, shortness of breath and wheezing.   Cardiovascular:  Negative for chest pain, palpitations and leg swelling.  Gastrointestinal:  Negative for abdominal pain, blood in stool, constipation, diarrhea and nausea.  Endocrine: Negative for polydipsia and polyphagia.  Genitourinary: Negative.  Negative for dysuria, flank pain, hematuria, pelvic pain, vaginal bleeding and vaginal discharge.  Musculoskeletal:  Positive for arthralgias (joints of hands). Negative for back pain, gait problem and joint swelling.  Skin:  Negative for rash.  Neurological: Negative.  Negative for dizziness, tremors, seizures, weakness, light-headedness, numbness and headaches.  Hematological:  Negative for adenopathy.  Psychiatric/Behavioral: Negative.  Negative for behavioral problems, confusion and dysphoric mood. The patient is not nervous/anxious and is not hyperactive.       Objective    BP 138/73 (BP Location: Left Arm, Patient Position: Sitting, Cuff Size: Normal)   Pulse 83   Temp 97.9 F (36.6 C) (Oral)   Ht 5' 6 (1.676 m)   Wt 179 lb 6.4 oz (81.4 kg)   SpO2 100%   BMI 28.96 kg/m    Physical Exam Vitals and nursing note reviewed.  Constitutional:      General: She is awake.     Appearance: Normal appearance.  HENT:     Head: Normocephalic and atraumatic.     Right Ear: Tympanic membrane, ear canal and external ear normal.     Left Ear: Tympanic membrane, ear canal and external ear normal.     Nose: Nose normal.     Mouth/Throat:     Mouth: Mucous membranes are moist.     Pharynx: Oropharynx is clear. No oropharyngeal exudate or posterior oropharyngeal erythema.  Eyes:     General: No scleral icterus.    Extraocular Movements: Extraocular movements intact.      Conjunctiva/sclera: Conjunctivae normal.     Pupils: Pupils are equal, round, and reactive  to light.  Neck:     Thyroid : No thyromegaly or thyroid  tenderness.  Cardiovascular:     Rate and Rhythm: Normal rate and regular rhythm.     Pulses: Normal pulses.     Heart sounds: Normal heart sounds.  Pulmonary:     Effort: Pulmonary effort is normal. No tachypnea, bradypnea or respiratory distress.     Breath sounds: Normal breath sounds. No stridor. No wheezing, rhonchi or rales.  Abdominal:     General: Bowel sounds are normal. There is no distension.     Palpations: Abdomen is soft. There is no mass.     Tenderness: There is no abdominal tenderness. There is no guarding.     Hernia: No hernia is present.  Musculoskeletal:     Cervical back: Normal range of motion and neck supple.     Right lower leg: No edema.     Left lower leg: No edema.  Lymphadenopathy:     Cervical: No cervical adenopathy.  Skin:    General: Skin is warm and dry.     Comments: Herberden and Biuchard nodes to hands bilaterally  Neurological:     Mental Status: She is alert and oriented to person, place, and time. Mental status is at baseline.  Psychiatric:        Mood and Affect: Mood normal.        Behavior: Behavior normal.      Last depression screening scores    04/12/2024   10:04 AM 05/01/2023   10:07 AM 08/17/2022   11:54 AM  PHQ 2/9 Scores  PHQ - 2 Score 0 0 0  PHQ- 9 Score 0  0   Last fall risk screening    04/12/2024   10:04 AM  Fall Risk   Falls in the past year? 0  Number falls in past yr: 0  Injury with Fall? 0  Risk for fall due to : No Fall Risks   Last Audit-C alcohol use screening    04/11/2024    5:10 PM  Alcohol Use Disorder Test (AUDIT)  1. How often do you have a drink containing alcohol? 3  2. How many drinks containing alcohol do you have on a typical day when you are drinking? 0  3. How often do you have six or more drinks on one occasion? 0  AUDIT-C Score 3   4. How  often during the last year have you found that you were not able to stop drinking once you had started? 0  5. How often during the last year have you failed to do what was normally expected from you because of drinking? 0  6. How often during the last year have you needed a first drink in the morning to get yourself going after a heavy drinking session? 0  7. How often during the last year have you had a feeling of guilt of remorse after drinking? 0  8. How often during the last year have you been unable to remember what happened the night before because you had been drinking? 0  9. Have you or someone else been injured as a result of your drinking? 0  10. Has a relative or friend or a doctor or another health worker been concerned about your drinking or suggested you cut down? 0  Alcohol Use Disorder Identification Test Final Score (AUDIT) 3      Patient-reported   A score of 3 or more in women, and 4 or more in men indicates  increased risk for alcohol abuse, EXCEPT if all of the points are from question 1   No results found for any visits on 04/12/24.  Assessment & Plan    Routine Health Maintenance and Physical Exam  Exercise Activities and Dietary recommendations  Goals      Exercise 3x per week (30 min per time)     Recommend to start exercising 3 days a week for at least 30 minutes at a time.        Immunization History  Administered Date(s) Administered   Fluad Quad(high Dose 65+) 03/31/2020, 04/11/2022   INFLUENZA, HIGH DOSE SEASONAL PF 05/13/2014, 04/01/2016, 05/03/2017   Influenza,inj,Quad PF,6+ Mos 05/08/2013, 04/17/2021   Influenza-Unspecified 05/26/2015, 04/17/2018, 04/17/2021   Moderna SARS-COV2 Booster Vaccination 06/25/2020   Moderna Sars-Covid-2 Vaccination 08/30/2019, 10/01/2019   Pneumococcal Conjugate-13 05/13/2014   Pneumococcal Polysaccharide-23 05/08/2013   Td 04/25/2003   Tdap 08/18/2010   Zoster Recombinant(Shingrix) 02/20/2018, 06/01/2018, 06/25/2018    Zoster, Live 06/02/2008    Health Maintenance  Topic Date Due   Medicare Annual Wellness (AWV)  04/30/2024   COVID-19 Vaccine (3 - Moderna risk series) 04/24/2024 (Originally 07/23/2020)   Influenza Vaccine  10/22/2024 (Originally 02/23/2024)   DTaP/Tdap/Td (3 - Td or Tdap) 04/12/2025 (Originally 08/18/2020)   DEXA SCAN  04/19/2026   Colonoscopy  06/14/2026   Pneumococcal Vaccine: 50+ Years  Completed   Hepatitis C Screening  Completed   Zoster Vaccines- Shingrix  Completed   HPV VACCINES  Aged Out   Meningococcal B Vaccine  Aged Out   Mammogram  Discontinued    Discussed health benefits of physical activity, and encouraged her to engage in regular exercise appropriate for her age and condition.   Annual physical exam  Essential hypertension -     Comprehensive metabolic panel with GFR  Hypercholesterolemia -     Lipid panel -     Atorvastatin  Calcium ; Take 1 tablet (20 mg total) by mouth daily.  Dispense: 90 tablet; Refill: 1  Personal history of colon cancer  Other osteoarthritis involving multiple joints  Chronic kidney disease, stage 3a (HCC) -     Microalbumin / creatinine urine ratio -     Comprehensive metabolic panel with GFR  OSA on CPAP  Prediabetes -     Hemoglobin A1c  Encounter for screening mammogram for breast cancer -     3D Screening Mammogram, Left and Right; Future      Annual physical exam Routine adult wellness visit with no acute concerns. Physical exam overall unremarkable except as noted above. Routine lab work ordered as noted.  - Perform lab work as discussed. - Attend Medicare annual wellness visit as scheduled. - Schedule mammogram for October. - Follow up with dermatologist on October 1st. - Plan to repeat bone density test between now and 2027.  Essential hypertension Chronic, stable.  Hypertension well-managed with current medication regimen.  Continue irbesartan-hydrochlorothiazide 100-12.5 mg  daily  Hypercholesterolemia Hypercholesterolemia managed with atorvastatin , doing well on current medication.  Continue atorvastatin  20 mg daily.  Refilled today.  Personal history of Colon Cancer, Status Post Resection Colon cancer status post resection, under surveillance with normal Signatera blood marker tests. - Continue Signatera blood marker tests every 3 to 6 months.  Other osteoarthritis involving multiple joints Chronic osteoarthritis with mild symptoms, not requiring daily medication. - Continue current management without daily medication unless symptoms worsen.  Chronic Kidney Disease, Stage 3a Chronic kidney disease with slightly reduced kidney function, no acute concerns.  OSA on CPAP  Adherent to CPAP and deriving benefit.    Return in about 6 months (around 10/10/2024) for Chronic f/u.     I discussed the assessment and treatment plan with the patient  The patient was provided an opportunity to ask questions and all were answered. The patient agreed with the plan and demonstrated an understanding of the instructions.   The patient was advised to call back or seek an in-person evaluation if the symptoms worsen or if the condition fails to improve as anticipated.    LAURAINE LOISE BUOY, DO  [facility] [phone] (phone) [phone] (fax)  [facility]

## 2024-04-13 LAB — COMPREHENSIVE METABOLIC PANEL WITH GFR
ALT: 52 IU/L — ABNORMAL HIGH (ref 0–32)
AST: 37 IU/L (ref 0–40)
Albumin: 4.4 g/dL (ref 3.8–4.8)
Alkaline Phosphatase: 65 IU/L (ref 49–135)
BUN/Creatinine Ratio: 17 (ref 12–28)
BUN: 20 mg/dL (ref 8–27)
Bilirubin Total: 0.6 mg/dL (ref 0.0–1.2)
CO2: 21 mmol/L (ref 20–29)
Calcium: 10 mg/dL (ref 8.7–10.3)
Chloride: 100 mmol/L (ref 96–106)
Creatinine, Ser: 1.16 mg/dL — ABNORMAL HIGH (ref 0.57–1.00)
Globulin, Total: 2.6 g/dL (ref 1.5–4.5)
Glucose: 101 mg/dL — ABNORMAL HIGH (ref 70–99)
Potassium: 4.6 mmol/L (ref 3.5–5.2)
Sodium: 138 mmol/L (ref 134–144)
Total Protein: 7 g/dL (ref 6.0–8.5)
eGFR: 49 mL/min/1.73 — ABNORMAL LOW (ref >59)

## 2024-04-13 LAB — HEMOGLOBIN A1C
Est. average glucose Bld gHb Est-mCnc: 123 mg/dL
Hgb A1c MFr Bld: 5.9 % — ABNORMAL HIGH (ref 4.8–5.6)

## 2024-04-13 LAB — LIPID PANEL
Chol/HDL Ratio: 1.9 ratio (ref 0.0–4.4)
Cholesterol, Total: 159 mg/dL (ref 100–199)
HDL: 82 mg/dL (ref >39)
LDL Chol Calc (NIH): 57 mg/dL (ref 0–99)
Triglycerides: 113 mg/dL (ref 0–149)
VLDL Cholesterol Cal: 20 mg/dL (ref 5–40)

## 2024-04-13 LAB — MICROALBUMIN / CREATININE URINE RATIO
Creatinine, Urine: 44.3 mg/dL
Microalb/Creat Ratio: 10 mg/g{creat} (ref 0–29)
Microalbumin, Urine: 4.4 ug/mL

## 2024-04-26 DIAGNOSIS — D1801 Hemangioma of skin and subcutaneous tissue: Secondary | ICD-10-CM | POA: Diagnosis not present

## 2024-04-26 DIAGNOSIS — D2371 Other benign neoplasm of skin of right lower limb, including hip: Secondary | ICD-10-CM | POA: Diagnosis not present

## 2024-04-26 DIAGNOSIS — L821 Other seborrheic keratosis: Secondary | ICD-10-CM | POA: Diagnosis not present

## 2024-04-29 ENCOUNTER — Ambulatory Visit: Payer: Self-pay | Admitting: Family Medicine

## 2024-05-14 ENCOUNTER — Other Ambulatory Visit: Payer: Self-pay | Admitting: *Deleted

## 2024-05-14 DIAGNOSIS — C182 Malignant neoplasm of ascending colon: Secondary | ICD-10-CM

## 2024-05-21 ENCOUNTER — Inpatient Hospital Stay: Payer: Medicare HMO | Attending: Internal Medicine

## 2024-05-21 DIAGNOSIS — C182 Malignant neoplasm of ascending colon: Secondary | ICD-10-CM

## 2024-05-21 LAB — GENETIC SCREENING ORDER

## 2024-05-22 ENCOUNTER — Ambulatory Visit: Payer: Medicare HMO | Admitting: Emergency Medicine

## 2024-05-22 VITALS — Ht 66.0 in | Wt 170.0 lb

## 2024-05-22 DIAGNOSIS — Z0000 Encounter for general adult medical examination without abnormal findings: Secondary | ICD-10-CM

## 2024-05-22 LAB — GENETIC SCREENING ORDER

## 2024-05-22 NOTE — Progress Notes (Signed)
 Subjective:   Cindy Vega is a 78 y.o. who presents for a Medicare Wellness preventive visit.  As a reminder, Annual Wellness Visits don't include a physical exam, and some assessments may be limited, especially if this visit is performed virtually. We may recommend an in-person follow-up visit with your provider if needed.  Visit Complete: Virtual I connected with  Cindy Vega on 05/22/24 by a audio enabled telemedicine application and verified that I am speaking with the correct person using two identifiers.  Patient Location: Home  Provider Location: Home Office  I discussed the limitations of evaluation and management by telemedicine. The patient expressed understanding and agreed to proceed.  Vital Signs: Because this visit was a virtual/telehealth visit, some criteria may be missing or patient reported. Any vitals not documented were not able to be obtained and vitals that have been documented are patient reported.  VideoDeclined- This patient declined Librarian, academic. Therefore the visit was completed with audio only.  Persons Participating in Visit: Patient.  AWV Questionnaire: Yes: Patient Medicare AWV questionnaire was completed by the patient on 05/20/24; I have confirmed that all information answered by patient is correct and no changes since this date.  Cardiac Risk Factors include: advanced age (>66men, >42 women);hypertension;dyslipidemia;Other (see comment), Risk factor comments: OSA (cpap), prediabetic     Objective:    Today's Vitals   05/22/24 1051  Weight: 170 lb (77.1 kg)  Height: 5' 6 (1.676 m)   Body mass index is 27.44 kg/m.     05/22/2024   11:00 AM 10/18/2023   12:43 PM 06/15/2023    7:17 AM 05/01/2023   10:10 AM 08/17/2022   11:37 AM 07/05/2022    8:17 AM 06/28/2022   10:27 AM  Advanced Directives  Does Patient Have a Medical Advance Directive? Yes No Yes Yes Yes Yes Yes  Type of Special Educational Needs Teacher of [place];Living will  Healthcare Power of [place];Living will Healthcare Power of [place];Living will Healthcare Power of [place];Living will Healthcare Power of [place];Living will   Does patient want to make changes to medical advance directive? No - Patient declined     No - Patient declined   Copy of Healthcare Power of Attorney in Chart? Yes - validated most recent copy scanned in chart (See row information)    Yes - validated most recent copy scanned in chart (See row information) Yes - validated most recent copy scanned in chart (See row information)   Would patient like information on creating a medical advance directive?  No - Patient declined         Current Medications (verified) Outpatient Encounter Medications as of 05/22/2024  Medication Sig   aspirin 81 MG tablet Take by mouth every other day.    atorvastatin  (LIPITOR) 20 MG tablet Take 1 tablet (20 mg total) by mouth daily.   Biotin 5 MG CAPS daily.    cholecalciferol (VITAMIN D ) 1000 units tablet Take 1,000 Units by mouth daily.    colchicine  0.6 MG tablet TAKE 2 TABLETS ON DAY 1, THEN 1 TABLET DAILY AS DIRECTED THEREAFTER UNTIL SYMPTOMS RESOLVE. (Patient taking differently: TAKE 2 TABLETS ON DAY 1, THEN 1 TABLET DAILY AS DIRECTED THEREAFTER UNTIL SYMPTOMS RESOLVE. PRN for gout flare)   latanoprost  (XALATAN ) 0.005 % ophthalmic solution 1 drop at bedtime.   losartan -hydrochlorothiazide (HYZAAR) 100-12.5 MG tablet Take 1 tablet by mouth daily.   MULTIPLE VITAMIN PO Take by mouth daily.    Omega-3 Fatty Acids (FISH  OIL) 1000 MG CAPS daily.    No facility-administered encounter medications on file as of 05/22/2024.    Allergies (verified) Silver  nitrate   History: Past Medical History:  Diagnosis Date   Allergy 2000   Seasonal in Spring   Anemia    Anxiety    Arthritis    fingers   Cancer (HCC)    basal cell, COLON   Cataract 2005   Hypertension    PONV (postoperative nausea and vomiting)     after hysterectomy   Pre-diabetes    Sleep apnea    CPAP   Past Surgical History:  Procedure Laterality Date   ABDOMINAL HYSTERECTOMY  1996   PARTIAL   BREAST CYST EXCISION Right 1976   cyst removed-neg   CATARACT EXTRACTION Bilateral    COLON SURGERY  2023   COLONOSCOPY WITH PROPOFOL  N/A 03/02/2017   Procedure: COLONOSCOPY WITH PROPOFOL ;  Surgeon: Jinny Carmine, MD;  Location: [facility];  Service: Endoscopy;  Laterality: N/A;   COLONOSCOPY WITH PROPOFOL  N/A 06/13/2022   Procedure: COLONOSCOPY WITH PROPOFOL  WITH BIOPSY;  Surgeon: Jinny Carmine, MD;  Location: [facility];  Service: Endoscopy;  Laterality: N/A;  SPOT USED TO MARK ASCENDING COLON POLYP   COLONOSCOPY WITH PROPOFOL  N/A 06/15/2023   Procedure: COLONOSCOPY WITH PROPOFOL ;  Surgeon: Jinny Carmine, MD;  Location: [facility];  Service: Endoscopy;  Laterality: N/A;   ESOPHAGOGASTRODUODENOSCOPY (EGD) WITH PROPOFOL  N/A 06/13/2022   Procedure: ESOPHAGOGASTRODUODENOSCOPY (EGD) WITH PROPOFOL ;  Surgeon: Jinny Carmine, MD;  Location: [facility];  Service: Endoscopy;  Laterality: N/A;  Sleep apnea   LAPAROSCOPIC RIGHT COLECTOMY N/A 07/05/2022   Procedure: LAPAROSCOPIC RIGHT COLECTOMY hand assisted, RNFA to assist;  Surgeon: Jordis Laneta FALCON, MD;  Location: [facility];  Service: General;  Laterality: N/A;   POLYPECTOMY N/A 03/02/2017   Procedure: POLYPECTOMY;  Surgeon: Jinny Carmine, MD;  Location: [facility];  Service: Endoscopy;  Laterality: N/A;   Family History  Problem Relation Age of Onset   Hypertension Mother    Heart disease Mother    Diabetes Mother    Arthritis Mother    Diabetes Father    Prostate cancer Father    Breast cancer Sister 71   Cancer Sister    Graves' disease Sister    Breast cancer Sister    Social History   Socioeconomic History   Marital status: Married    Spouse name: Fairy   Number of children: 2   Years of education: Not on file   Highest education level:  Bachelor's degree (e.g., BA, AB, BS)  Occupational History   Occupation: retired  Tobacco Use   Smoking status: Never   Smokeless tobacco: Never  Vaping Use   Vaping status: Never Used  Substance and Sexual Activity   Alcohol use: Yes    Alcohol/week: 3.0 standard drinks of alcohol    Types: 3 Glasses of wine per week    Comment: 1 glass of wine 3 times per week   Drug use: No   Sexual activity: Not Currently  Other Topics Concern   Not on file  Social History Narrative   Not on file   Social Drivers of Health   Financial Resource Strain: Low Risk  (05/22/2024)   Overall Financial Resource Strain (CARDIA)    Difficulty of Paying Living Expenses: Not hard at all  Food Insecurity: No Food Insecurity (05/22/2024)   Hunger Vital Sign    Worried About Running Out of Food in the Last Year: Never true  Ran Out of Food in the Last Year: Never true  Transportation Needs: No Transportation Needs (05/22/2024)   PRAPARE - Administrator, Civil Service (Medical): No    Lack of Transportation (Non-Medical): No  Physical Activity: Insufficiently Active (05/22/2024)   Exercise Vital Sign    Days of Exercise per Week: 2 days    Minutes of Exercise per Session: 10 min  Stress: No Stress Concern Present (05/22/2024)   [employer] of Occupational Health - Occupational Stress Questionnaire    Feeling of Stress: Not at all  Social Connections: Socially Integrated (05/22/2024)   Social Connection and Isolation Panel    Frequency of Communication with Friends and Family: More than three times a week    Frequency of Social Gatherings with Friends and Family: More than three times a week    Attends Religious Services: More than 4 times per year    Active Member of [employer] or Organizations: Yes    Attends Engineer, Structural: More than 4 times per year    Marital Status: Married    Tobacco Counseling Counseling given: Not Answered    Clinical  Intake:  Pre-visit preparation completed: Yes  Pain : No/denies pain     BMI - recorded: 27.44 Nutritional Status: BMI 25 -29 Overweight Nutritional Risks: None Diabetes: No  Lab Results  Component Value Date   HGBA1C 5.9 (H) 04/12/2024   HGBA1C 6.0 (H) 10/12/2023   HGBA1C 5.9 (H) 04/13/2023     How often do you need to have someone help you when you read instructions, pamphlets, or other written materials from your doctor or pharmacy?: 1 - Never  Interpreter Needed?: No  Information entered by :: Vina Ned, CMA   Activities of Daily Living     05/22/2024   10:53 AM 05/20/2024    4:29 PM  In your present state of health, do you have any difficulty performing the following activities:  Hearing? 0 0  Vision? 0 0  Difficulty concentrating or making decisions? 0 0  Walking or climbing stairs? 0 0  Dressing or bathing? 0 0  Doing errands, shopping? 0 0  Preparing Food and eating ? N N  Using the Toilet? N N  In the past six months, have you accidently leaked urine? N N  Do you have problems with loss of bowel control? N N  Managing your Medications? N N  Managing your Finances? N N  Housekeeping or managing your Housekeeping? N N    Patient Care Team: Donzella Lauraine SAILOR, DO as PCP - General (Family Medicine) Myrna Adine Anes, MD as Consulting Physician (Ophthalmology) Isenstein, Arin L, MD (Dermatology) Maurie Rayfield BIRCH, RN as Oncology Nurse Navigator Rennie Cindy SAUNDERS, MD as Consulting Physician (Oncology)  I have updated your Care Teams any recent Medical Services you may have received from other providers in the past year.     Assessment:   This is a routine wellness examination for [place].  Hearing/Vision screen Hearing Screening - Comments:: Denies hearing loss  Vision Screening - Comments:: UTD @ Dr. Adine Myrna [place]    Goals Addressed               This Visit's Progress     Increase physical activity (pt-stated)          Depression Screen     05/22/2024   10:59 AM 04/12/2024   10:04 AM 05/01/2023   10:07 AM 08/17/2022   11:54 AM 08/04/2022    8:57  AM 06/01/2022   11:16 AM 04/26/2022   10:32 AM  PHQ 2/9 Scores  PHQ - 2 Score 0 0 0 0 0 0 0  PHQ- 9 Score 0 0  0 0 3 0    Fall Risk     05/22/2024   11:01 AM 05/20/2024    4:29 PM 04/12/2024   10:04 AM 05/01/2023   10:04 AM 08/15/2022   10:22 AM  Fall Risk   Falls in the past year? 0 0 0 0 0  Number falls in past yr: 0  0 0   Injury with Fall? 0  0 0   Risk for fall due to : No Fall Risks  No Fall Risks No Fall Risks   Follow up Falls evaluation completed   Education provided;Falls prevention discussed     MEDICARE RISK AT HOME:  Medicare Risk at Home Any stairs in or around the home?: Yes If so, are there any without handrails?: No Home free of loose throw rugs in walkways, pet beds, electrical cords, etc?: Yes Adequate lighting in your home to reduce risk of falls?: Yes Life alert?: No Use of a cane, walker or w/c?: No Grab bars in the bathroom?: Yes Shower chair or bench in shower?: No Elevated toilet seat or a handicapped toilet?: Yes  TIMED UP AND GO:  Was the test performed?  No  Cognitive Function: 6CIT completed        05/22/2024   11:02 AM 05/01/2023   10:13 AM 04/26/2022   10:36 AM 04/20/2021    6:36 PM  6CIT Screen  What Year? 0 points 0 points 0 points   What month? 0 points 0 points 0 points 0 points  What time? 0 points 0 points 0 points 0 points  Count back from 20 0 points 0 points 0 points 0 points  Months in reverse 0 points 0 points 0 points 0 points  Repeat phrase 0 points 0 points 0 points 0 points  Total Score 0 points 0 points 0 points     Immunizations Immunization History  Administered Date(s) Administered   Fluad Quad(high Dose 65+) 03/31/2020, 04/11/2022   INFLUENZA, HIGH DOSE SEASONAL PF 05/13/2014, 04/01/2016, 05/03/2017   Influenza,inj,Quad PF,6+ Mos 05/08/2013, 04/17/2021   Influenza-Unspecified  05/26/2015, 04/17/2018, 04/17/2021, 05/21/2024   Moderna SARS-COV2 Booster Vaccination 06/25/2020   Moderna Sars-Covid-2 Vaccination 08/30/2019, 10/01/2019   Pneumococcal Conjugate-13 05/13/2014   Pneumococcal Polysaccharide-23 05/08/2013   Td 04/25/2003   Tdap 08/18/2010   Zoster Recombinant(Shingrix) 02/20/2018, 06/01/2018, 06/25/2018   Zoster, Live 06/02/2008    Screening Tests Health Maintenance  Topic Date Due   COVID-19 Vaccine (3 - Moderna risk series) 07/23/2020   Mammogram  05/16/2024   DTaP/Tdap/Td (3 - Td or Tdap) 04/12/2025 (Originally 08/18/2020)   Medicare Annual Wellness (AWV)  05/22/2025   DEXA SCAN  04/19/2026   Colonoscopy  06/14/2026   Pneumococcal Vaccine: 50+ Years  Completed   Influenza Vaccine  Completed   Hepatitis C Screening  Completed   Zoster Vaccines- Shingrix  Completed   Meningococcal B Vaccine  Aged Out    Health Maintenance Items Addressed: Vaccines Due: Tdap, See Nurse Notes at the end of this note  Additional Screening:  Vision Screening: Recommended annual ophthalmology exams for early detection of glaucoma and other disorders of the eye. Is the patient up to date with their annual eye exam?  Yes  Who is the provider or what is the name of the office in which the patient attends  annual eye exams? Dr. Adine Novak @ [facility] [place]  Dental Screening: Recommended annual dental exams for proper oral hygiene  Community Resource Referral / Chronic Care Management: CRR required this visit?  No   CCM required this visit?  No   Plan:    I have personally reviewed and noted the following in the patient's chart:   Medical and social history Use of alcohol, tobacco or illicit drugs  Current medications and supplements including opioid prescriptions. Patient is not currently taking opioid prescriptions. Functional ability and status Nutritional status Physical activity Advanced directives List of other physicians Hospitalizations,  surgeries, and ER visits in previous 12 months Vitals Screenings to include cognitive, depression, and falls Referrals and appointments  In addition, I have reviewed and discussed with patient certain preventive protocols, quality metrics, and best practice recommendations. A written personalized care plan for preventive services as well as general preventive health recommendations were provided to patient.   Vina Ned, CMA   05/22/2024   After Visit Summary: (MyChart) Due to this being a telephonic visit, the after visit summary with patients personalized plan was offered to patient via MyChart   Notes:  MMG scheduled for 06/03/24 Needs Tdap vaccine (pharmacy) Declined Covid vaccine

## 2024-05-22 NOTE — Patient Instructions (Signed)
 Cindy Vega,  Thank you for taking the time for your Medicare Wellness Visit. I appreciate your continued commitment to your health goals. Please review the care plan we discussed, and feel free to reach out if I can assist you further.  Medicare recommends these wellness visits once per year to help you and your care team stay ahead of potential health issues. These visits are designed to focus on prevention, allowing your provider to concentrate on managing your acute and chronic conditions during your regular appointments.  Please note that Annual Wellness Visits do not include a physical exam. Some assessments may be limited, especially if the visit was conducted virtually. If needed, we may recommend a separate in-person follow-up with your provider.  Ongoing Care Seeing your primary care provider every 3 to 6 months helps us  monitor your health and provide consistent, personalized care.   Referrals If a referral was made during today's visit and you haven't received any updates within two weeks, please contact the referred provider directly to check on the status.  Recommended Screenings: Get a tetanus vaccine at your local pharmacy at your convenience.   Health Maintenance  Topic Date Due   COVID-19 Vaccine (3 - Moderna risk series) 07/23/2020   Breast Cancer Screening  05/16/2024   DTaP/Tdap/Td vaccine (3 - Td or Tdap) 04/12/2025*   Medicare Annual Wellness Visit  05/22/2025   DEXA scan (bone density measurement)  04/19/2026   Colon Cancer Screening  06/14/2026   Pneumococcal Vaccine for age over 42  Completed   Flu Shot  Completed   Hepatitis C Screening  Completed   Zoster (Shingles) Vaccine  Completed   Meningitis B Vaccine  Aged Out  *Topic was postponed. The date shown is not the original due date.       05/22/2024   11:00 AM  Advanced Directives  Does Patient Have a Medical Advance Directive? Yes  Type of Estate Agent of [place];Living will   Does patient want to make changes to medical advance directive? No - Patient declined  Copy of Healthcare Power of Attorney in Chart? Yes - validated most recent copy scanned in chart (See row information)   Advance Care Planning is important because it: Ensures you receive medical care that aligns with your values, goals, and preferences. Provides guidance to your family and loved ones, reducing the emotional burden of decision-making during critical moments.  Vision: Annual vision screenings are recommended for early detection of glaucoma, cataracts, and diabetic retinopathy. These exams can also reveal signs of chronic conditions such as diabetes and high blood pressure.  Dental: Annual dental screenings help detect early signs of oral cancer, gum disease, and other conditions linked to overall health, including heart disease and diabetes.  Please see the attached documents for additional preventive care recommendations.   Fall Prevention in the Home, Adult Falls can cause injuries and affect people of all ages. There are many simple things that you can do to make your home safe and to help prevent falls. If you need it, ask for help making these changes. What actions can I take to prevent falls? General information Use good lighting in all rooms. Make sure to: Replace any light bulbs that burn out. Turn on lights if it is dark and use night-lights. Keep items that you use often in easy-to-reach places. Lower the shelves around your home if needed. Move furniture so that there are clear paths around it. Do not keep throw rugs or other things on  the floor that can make you trip. If any of your floors are uneven, fix them. Add color or contrast paint or tape to clearly mark and help you see: Grab bars or handrails. First and last steps of staircases. Where the edge of each step is. If you use a ladder or stepladder: Make sure that it is fully opened. Do not climb a closed  ladder. Make sure the sides of the ladder are locked in place. Have someone hold the ladder while you use it. Know where your pets are as you move through your home. What can I do in the bathroom?     Keep the floor dry. Clean up any water  that is on the floor right away. Remove soap buildup in the bathtub or shower. Buildup makes bathtubs and showers slippery. Use non-skid mats or decals on the floor of the bathtub or shower. Attach bath mats securely with double-sided, non-slip rug tape. If you need to sit down while you are in the shower, use a non-slip stool. Install grab bars by the toilet and in the bathtub and shower. Do not use towel bars as grab bars. What can I do in the bedroom? Make sure that you have a light by your bed that is easy to reach. Do not use any sheets or blankets on your bed that hang to the floor. Have a firm bench or chair with side arms that you can use for support when you get dressed. What can I do in the kitchen? Clean up any spills right away. If you need to reach something above you, use a sturdy step stool that has a grab bar. Keep electrical cables out of the way. Do not use floor polish or wax that makes floors slippery. What can I do with my stairs? Do not leave anything on the stairs. Make sure that you have a light switch at the top and the bottom of the stairs. Have them installed if you do not have them. Make sure that there are handrails on both sides of the stairs. Fix handrails that are broken or loose. Make sure that handrails are as long as the staircases. Install non-slip stair treads on all stairs in your home if they do not have carpet. Avoid having throw rugs at the top or bottom of stairs, or secure the rugs with carpet tape to prevent them from moving. Choose a carpet design that does not hide the edge of steps on the stairs. Make sure that carpet is firmly attached to the stairs. Fix any carpet that is loose or worn. What can I do on  the outside of my home? Use bright outdoor lighting. Repair the edges of walkways and driveways and fix any cracks. Clear paths of anything that can make you trip, such as tools or rocks. Add color or contrast paint or tape to clearly mark and help you see high doorway thresholds. Trim any bushes or trees on the main path into your home. Check that handrails are securely fastened and in good repair. Both sides of all steps should have handrails. Install guardrails along the edges of any raised decks or porches. Have leaves, snow, and ice cleared regularly. Use sand, salt, or ice melt on walkways during winter months if you live where there is ice and snow. In the garage, clean up any spills right away, including grease or oil spills. What other actions can I take? Review your medicines with your health care provider. Some medicines can make  you confused or feel dizzy. This can increase your chance of falling. Wear closed-toe shoes that fit well and support your feet. Wear shoes that have rubber soles and low heels. Use a cane, walker, scooter, or crutches that help you move around if needed. Talk with your provider about other ways that you can decrease your risk of falls. This may include seeing a physical therapist to learn to do exercises to improve movement and strength. Where to find more information Centers for Disease Control and Prevention, [employer]: tonerpromos.no [employer] on Aging: baseringtones.pl National Institute on Aging: baseringtones.pl Contact a health care provider if: You are afraid of falling at home. You feel weak, drowsy, or dizzy at home. You fall at home. Get help right away if you: Lose consciousness or have trouble moving after a fall. Have a fall that causes a head injury. These symptoms may be an emergency. Get help right away. Call 911. Do not wait to see if the symptoms will go away. Do not drive yourself to the hospital. This information is not intended to replace  advice given to you by your health care provider. Make sure you discuss any questions you have with your health care provider. Document Revised: 03/14/2022 Document Reviewed: 03/14/2022 Elsevier Patient Education  2024 [employer].

## 2024-05-27 LAB — SIGNATERA
SIGNATERA MTM READOUT: 0 MTM/ml
SIGNATERA TEST RESULT: NEGATIVE

## 2024-06-03 ENCOUNTER — Ambulatory Visit
Admission: RE | Admit: 2024-06-03 | Discharge: 2024-06-03 | Disposition: A | Discharge status: Home / Self Care | Source: Ambulatory Visit | Attending: Family Medicine | Admitting: Family Medicine

## 2024-06-03 DIAGNOSIS — Z1231 Encounter for screening mammogram for malignant neoplasm of breast: Secondary | ICD-10-CM | POA: Diagnosis not present

## 2024-07-02 LAB — GENETIC SCREENING ORDER

## 2024-08-21 ENCOUNTER — Other Ambulatory Visit: Payer: Self-pay

## 2024-08-21 ENCOUNTER — Inpatient Hospital Stay: Payer: Medicare HMO | Attending: Internal Medicine

## 2024-08-21 ENCOUNTER — Inpatient Hospital Stay: Payer: Medicare HMO | Admitting: Nurse Practitioner

## 2024-08-21 DIAGNOSIS — C182 Malignant neoplasm of ascending colon: Secondary | ICD-10-CM

## 2024-08-21 LAB — CMP (CANCER CENTER ONLY)
ALT: 47 U/L — ABNORMAL HIGH (ref 0–44)
AST: 35 U/L (ref 15–41)
Albumin: 4.4 g/dL (ref 3.5–5.0)
Alkaline Phosphatase: 58 U/L (ref 38–126)
Anion gap: 11 (ref 5–15)
BUN: 18 mg/dL (ref 8–23)
CO2: 26 mmol/L (ref 22–32)
Calcium: 9.8 mg/dL (ref 8.9–10.3)
Chloride: 101 mmol/L (ref 98–111)
Creatinine: 1.1 mg/dL — ABNORMAL HIGH (ref 0.44–1.00)
GFR, Estimated: 51 mL/min — ABNORMAL LOW
Glucose, Bld: 98 mg/dL (ref 70–99)
Potassium: 4.5 mmol/L (ref 3.5–5.1)
Sodium: 138 mmol/L (ref 135–145)
Total Bilirubin: 0.5 mg/dL (ref 0.0–1.2)
Total Protein: 7.2 g/dL (ref 6.5–8.1)

## 2024-08-21 LAB — CBC WITH DIFFERENTIAL (CANCER CENTER ONLY)
Abs Immature Granulocytes: 0.02 10*3/uL (ref 0.00–0.07)
Basophils Absolute: 0 10*3/uL (ref 0.0–0.1)
Basophils Relative: 1 %
Eosinophils Absolute: 0.3 10*3/uL (ref 0.0–0.5)
Eosinophils Relative: 5 %
HCT: 39.3 % (ref 36.0–46.0)
Hemoglobin: 13.6 g/dL (ref 12.0–15.0)
Immature Granulocytes: 0 %
Lymphocytes Relative: 36 %
Lymphs Abs: 2.3 10*3/uL (ref 0.7–4.0)
MCH: 32.5 pg (ref 26.0–34.0)
MCHC: 34.6 g/dL (ref 30.0–36.0)
MCV: 93.8 fL (ref 80.0–100.0)
Monocytes Absolute: 0.8 10*3/uL (ref 0.1–1.0)
Monocytes Relative: 12 %
Neutro Abs: 3.1 10*3/uL (ref 1.7–7.7)
Neutrophils Relative %: 46 %
Platelet Count: 233 10*3/uL (ref 150–400)
RBC: 4.19 MIL/uL (ref 3.87–5.11)
RDW: 12.8 % (ref 11.5–15.5)
WBC Count: 6.5 10*3/uL (ref 4.0–10.5)
nRBC: 0 % (ref 0.0–0.2)

## 2024-08-21 LAB — GENETIC SCREENING ORDER

## 2024-08-21 NOTE — Progress Notes (Signed)
 [facility] OFFICE PROGRESS NOTE  Patient Care Team: Pardue, Lauraine SAILOR, DO as PCP - General (Family Medicine) Myrna Adine Anes, MD as Consulting Physician (Ophthalmology) Chrystie Edmund CROME, MD (Dermatology) Maurie Rayfield BIRCH, RN as Oncology Nurse Navigator Rennie Cindy SAUNDERS, MD as Consulting Physician (Oncology)   Cancer Staging  Cancer of right colon [facility]) Staging form: Colon and Rectum, AJCC 8th Edition - Clinical: Stage IIA (cT3, cN0, cM0) - Signed by Rennie Cindy SAUNDERS, MD on 08/17/2022 Total positive nodes: 0    Oncology History Overview Note  # December 2023-screening colonoscopy [anemia]; ascending colon mass; right hemicolectomy [Dr.Pabone]-stage II colon cancer pT3N0- [low risk]- no adjuvant therapy; MMR stable   DIAGNOSIS: A.  Colon, right; colectomy: - 3.1 CM INVASIVE MODERATELY DIFFERENTIATED ADENOCARCINOMA, MARGINS NEGATIVE, NO EVIDENCE OF METASTATIC CARCINOMA IN 32 LYMPH NODES.  CANCER CASE SUMMARY: COLON AND RECTUM Standard(s): AJCC-UICC 8  SPECIMEN Procedure: Right colectomy  TUMOR Tumor Site: Ascending colon Histologic Type: Adenocarcinoma Histologic Grade: G2, moderately differentiated Tumor Size: Greatest dimension in Centimeters: 3.1 cm Tumor Extent: Invades through muscularis propria into the pericolonic tissue. Macroscopic Tumor Perforation: Not identified Lymph-Vascular Invasion: Not identified Perineural Invasion: Not identified Treatment Effect: No known presurgical therapy  MARGINS Margin Status for Invasive Carcinoma: All margins negative for invasive carcinoma                 Closest margin to invasive carcinoma: Mesenteric.                 Distance from invasive carcinoma to closest margin: 0.5 mm REGIONAL LYMPH NODES Regional Lymph Nodes: Regional lymph nodes present                                         All regional lymph nodes negative for tumor (0/32)  Tumor Deposits: Not identified  PATHOLOGIC STAGE  CLASSIFICATION (pTNM, AJCC 8th Edition): TNM Descriptors: Not applicable pT3 pN0 pM not applicable    Cancer of right colon (HCC)  07/05/2022 Initial Diagnosis   Cancer of right colon (HCC)   08/17/2022 Cancer Staging   Staging form: Colon and Rectum, AJCC 8th Edition - Clinical: Stage IIA (cT3, cN0, cM0) - Signed by Rennie Cindy SAUNDERS, MD on 08/17/2022 Total positive nodes: 0     INTERVAL HISTORY: Ambulating independently.  Alone.   Cindy Vega 79 y.o.  female pleasant patient with stage II colon cancer- low risk on signetera surveillaince.   Patient had a  surveillance colonoscopy in [facility] 2024.  She denies any worsening shortness of breath or cough.  Denies any blood in stools or black or stools.  No concerns today.  Review of Systems  Constitutional:  Negative for chills, diaphoresis, fever, malaise/fatigue and weight loss.  HENT:  Negative for nosebleeds and sore throat.   Eyes:  Negative for double vision.  Respiratory:  Negative for cough, hemoptysis, sputum production, shortness of breath and wheezing.   Cardiovascular:  Negative for chest pain, palpitations, orthopnea and leg swelling.  Gastrointestinal:  Negative for abdominal pain, blood in stool, constipation, diarrhea, heartburn, melena, nausea and vomiting.  Genitourinary:  Negative for dysuria, frequency and urgency.  Musculoskeletal:  Negative for back pain and joint pain.  Skin: Negative.  Negative for itching and rash.  Neurological:  Negative for dizziness, tingling, focal weakness, weakness and headaches.  Endo/Heme/Allergies:  Does not bruise/bleed easily.  Psychiatric/Behavioral:  Negative for  depression. The patient is not nervous/anxious and does not have insomnia.       PAST MEDICAL HISTORY :  Past Medical History:  Diagnosis Date   Allergy 2000   Seasonal in Spring   Anemia    Anxiety    Arthritis    fingers   Cancer (HCC)    basal cell, COLON   Cataract 2005   Hypertension    PONV  (postoperative nausea and vomiting)    after hysterectomy   Pre-diabetes    Sleep apnea    CPAP    PAST SURGICAL HISTORY :   Past Surgical History:  Procedure Laterality Date   ABDOMINAL HYSTERECTOMY  1996   PARTIAL   BREAST CYST EXCISION Right 1976   cyst removed-neg   CATARACT EXTRACTION Bilateral    COLON SURGERY  2023   COLONOSCOPY WITH PROPOFOL  N/A 03/02/2017   Procedure: COLONOSCOPY WITH PROPOFOL ;  Surgeon: Jinny Carmine, MD;  Location: [facility];  Service: Endoscopy;  Laterality: N/A;   COLONOSCOPY WITH PROPOFOL  N/A 06/13/2022   Procedure: COLONOSCOPY WITH PROPOFOL  WITH BIOPSY;  Surgeon: Jinny Carmine, MD;  Location: [facility];  Service: Endoscopy;  Laterality: N/A;  SPOT USED TO MARK ASCENDING COLON POLYP   COLONOSCOPY WITH PROPOFOL  N/A 06/15/2023   Procedure: COLONOSCOPY WITH PROPOFOL ;  Surgeon: Jinny Carmine, MD;  Location: [facility];  Service: Endoscopy;  Laterality: N/A;   ESOPHAGOGASTRODUODENOSCOPY (EGD) WITH PROPOFOL  N/A 06/13/2022   Procedure: ESOPHAGOGASTRODUODENOSCOPY (EGD) WITH PROPOFOL ;  Surgeon: Jinny Carmine, MD;  Location: [facility];  Service: Endoscopy;  Laterality: N/A;  Sleep apnea   LAPAROSCOPIC RIGHT COLECTOMY N/A 07/05/2022   Procedure: LAPAROSCOPIC RIGHT COLECTOMY hand assisted, RNFA to assist;  Surgeon: Jordis Laneta FALCON, MD;  Location: [facility];  Service: General;  Laterality: N/A;   POLYPECTOMY N/A 03/02/2017   Procedure: POLYPECTOMY;  Surgeon: Jinny Carmine, MD;  Location: [facility];  Service: Endoscopy;  Laterality: N/A;    FAMILY HISTORY :   Family History  Problem Relation Age of Onset   Hypertension Mother    Heart disease Mother    Diabetes Mother    Arthritis Mother    Diabetes Father    Prostate cancer Father    Breast cancer Sister 54   Cancer Sister    Graves' disease Sister    Breast cancer Sister     SOCIAL HISTORY:   Social History   Tobacco Use   Smoking status: Never   Smokeless  tobacco: Never  Vaping Use   Vaping status: Never Used  Substance Use Topics   Alcohol use: Yes    Alcohol/week: 3.0 standard drinks of alcohol    Types: 3 Glasses of wine per week    Comment: 1 glass of wine 3 times per week   Drug use: No    ALLERGIES:  is allergic to silver  nitrate.  MEDICATIONS:  Current Outpatient Medications  Medication Sig Dispense Refill   aspirin 81 MG tablet Take by mouth every other day.      atorvastatin  (LIPITOR) 20 MG tablet Take 1 tablet (20 mg total) by mouth daily. 90 tablet 1   Biotin 5 MG CAPS daily.      cholecalciferol (VITAMIN D ) 1000 units tablet Take 1,000 Units by mouth daily.      colchicine  0.6 MG tablet TAKE 2 TABLETS ON DAY 1, THEN 1 TABLET DAILY AS DIRECTED THEREAFTER UNTIL SYMPTOMS RESOLVE. (Patient taking differently: TAKE 2 TABLETS ON DAY 1, THEN 1 TABLET DAILY AS DIRECTED  THEREAFTER UNTIL SYMPTOMS RESOLVE. PRN for gout flare) 30 tablet 0   latanoprost  (XALATAN ) 0.005 % ophthalmic solution 1 drop at bedtime.     losartan -hydrochlorothiazide (HYZAAR) 100-12.5 MG tablet Take 1 tablet by mouth daily. 90 tablet 3   MULTIPLE VITAMIN PO Take by mouth daily.      Omega-3 Fatty Acids (FISH OIL) 1000 MG CAPS daily.      No current facility-administered medications for this visit.    PHYSICAL EXAMINATION:   BP (!) 155/76   Pulse 71   Resp 18   Ht 5' 6 (1.676 m)   Wt 180 lb (81.6 kg)   SpO2 99%   BMI 29.05 kg/m   Filed Weights   08/21/24 1043  Weight: 180 lb (81.6 kg)     Physical Exam Vitals and nursing note reviewed.  HENT:     Head: Normocephalic and atraumatic.     Mouth/Throat:     Pharynx: Oropharynx is clear.  Eyes:     Extraocular Movements: Extraocular movements intact.     Pupils: Pupils are equal, round, and reactive to light.  Cardiovascular:     Rate and Rhythm: Normal rate and regular rhythm.  Pulmonary:     Comments: Decreased breath sounds bilaterally.  Abdominal:     Palpations: Abdomen is soft.   Musculoskeletal:        General: Normal range of motion.     Cervical back: Normal range of motion.  Skin:    General: Skin is warm.  Neurological:     General: No focal deficit present.     Mental Status: She is alert and oriented to person, place, and time.  Psychiatric:        Behavior: Behavior normal.        Judgment: Judgment normal.        LABORATORY DATA:  I have reviewed the data as listed    Component Value Date/Time   NA 138 08/21/2024 1017   NA 138 04/12/2024 1048   NA 140 08/07/2012 1023   K 4.5 08/21/2024 1017   K 4.0 08/07/2012 1023   CL 101 08/21/2024 1017   CL 106 08/07/2012 1023   CO2 26 08/21/2024 1017   CO2 28 08/07/2012 1023   GLUCOSE 98 08/21/2024 1017   GLUCOSE 99 08/07/2012 1023   BUN 18 08/21/2024 1017   BUN 20 04/12/2024 1048   BUN 14 08/07/2012 1023   CREATININE 1.10 (H) 08/21/2024 1017   CREATININE 0.98 08/07/2012 1023   CALCIUM  9.8 08/21/2024 1017   CALCIUM  9.2 08/07/2012 1023   PROT 7.2 08/21/2024 1017   PROT 7.0 04/12/2024 1048   PROT 7.7 08/07/2012 1023   ALBUMIN 4.4 08/21/2024 1017   ALBUMIN 4.4 04/12/2024 1048   ALBUMIN 4.1 08/07/2012 1023   AST 35 08/21/2024 1017   ALT 47 (H) 08/21/2024 1017   ALT 30 08/07/2012 1023   ALKPHOS 58 08/21/2024 1017   ALKPHOS 63 08/07/2012 1023   BILITOT 0.5 08/21/2024 1017   GFRNONAA 51 (L) 08/21/2024 1017   GFRNONAA >60 08/07/2012 1023   GFRAA 64 04/10/2020 0933   GFRAA >60 08/07/2012 1023    No results found for: SPEP, UPEP  Lab Results  Component Value Date   WBC 6.5 08/21/2024   NEUTROABS 3.1 08/21/2024   HGB 13.6 08/21/2024   HCT 39.3 08/21/2024   MCV 93.8 08/21/2024   PLT 233 08/21/2024      Chemistry      Component Value Date/Time   NA  138 08/21/2024 1017   NA 138 04/12/2024 1048   NA 140 08/07/2012 1023   K 4.5 08/21/2024 1017   K 4.0 08/07/2012 1023   CL 101 08/21/2024 1017   CL 106 08/07/2012 1023   CO2 26 08/21/2024 1017   CO2 28 08/07/2012 1023   BUN 18  08/21/2024 1017   BUN 20 04/12/2024 1048   BUN 14 08/07/2012 1023   CREATININE 1.10 (H) 08/21/2024 1017   CREATININE 0.98 08/07/2012 1023   GLU 91 11/13/2014 0000      Component Value Date/Time   CALCIUM  9.8 08/21/2024 1017   CALCIUM  9.2 08/07/2012 1023   ALKPHOS 58 08/21/2024 1017   ALKPHOS 63 08/07/2012 1023   AST 35 08/21/2024 1017   ALT 47 (H) 08/21/2024 1017   ALT 30 08/07/2012 1023   BILITOT 0.5 08/21/2024 1017       RADIOGRAPHIC STUDIES: I have personally reviewed the radiological images as listed and agreed with the findings in the report. No results found.   ASSESSMENT & PLAN:  No problem-specific Assessment & Plan notes found for this encounter.   Orders Placed This Encounter  Procedures   Signatera    Select as applicable. If patient is on or planning to receive immunotherapy, select drug: Not on Immunotherapy   Do not Delete Below This Line   ==========Department Information========== ID: [id number] Department:[facility] - A DEPT OF [facility] [address] [place] [address] Dept: [phone] Dept Fax: [phone]    Standing Status:   Standing    Number of Occurrences:   4    Expiration Date:   08/21/2025    Jennell to follow up with patient for sample collection (mobile phleb, lab, or saliva)::   No    Surveillance Program draw frequency::   Every 3 Months    Surveillance Program draw count::   4    Cancer type::   Colon    Stage of diagnosis::   II    History of recurrence?:   No    Current disease status::   No evidence of disease    Date of surgery (MM/DD/YYYY)::   07/05/2022    Is the patient receiving or planning to receive immunotherapy?:   No    Patient status::   Outpatient    Most recent progress/clinical note attached?:   No    Pathology report attached?:   No    Tissue collection date (MM/DD/YYYY)::   07/05/2022    By placing this electronic order I  confirm the testing ordered herein is medically necessary and this patient has been informed of the details of the genetic test(s) ordered, including the risks, benefits, and alternatives, and has consented to testing.:   Yes    What type of billing?:   Bill Insurance   CEA    Standing Status:   Future    Expected Date:   08/21/2025    Expiration Date:   11/19/2025   CBC with Differential (Cancer Center Only)    Standing Status:   Future    Expected Date:   08/21/2025    Expiration Date:   11/19/2025   CMP (Cancer Center only)    Standing Status:   Future    Expected Date:   08/21/2025    Expiration Date:   11/19/2025   All questions were answered. The patient knows to call the clinic with any problems, questions or  concerns.   ASSESSMENT & PLAN:  Cancer of right colon (HCC) Right-sided colon cancer -stage II: 2pT3N0- [low risk]-moderately differentiated; more than 30 lymph nodes; no LVI; or perineural invasion; no perforation noted. NOV prior to surgery- CEA- 4.3. Tumor MSI stable. NO adjuvant chemotherapy.   # Continue surveillance with signatera. Every 6 months per protocol for 2 years. NOV 2024- s/p colonoscopy [Dr.Wohl]; awaiting again in 3 years.  Next will be done in 2027 signatera from today pending   # Re: Fatty liver:  Discussed importance of healthy weight/and weight loss.  Strongly recommend eating more green leafy vegetables and cutting down processed food/ carbohydrates.    # DISPOSITION: # signatera testing per protocol q 6 months # follow up in 1 year-MD;  labs- cbc/cmp,signatera;[facility]     Morna Husband, NP 08/21/2024 3:18 PM

## 2024-08-22 LAB — CEA: CEA: 3 ng/mL (ref 0.0–4.7)

## 2024-10-10 ENCOUNTER — Ambulatory Visit: Admitting: Family Medicine

## 2025-02-25 ENCOUNTER — Inpatient Hospital Stay

## 2025-05-28 ENCOUNTER — Ambulatory Visit

## 2025-08-21 ENCOUNTER — Inpatient Hospital Stay

## 2025-08-21 ENCOUNTER — Inpatient Hospital Stay: Admitting: Internal Medicine
